# Patient Record
Sex: Female | Born: 1970
Health system: Southern US, Community
[De-identification: ages and names within clinical notes are randomized; demographics above are authoritative.]

## PROBLEM LIST (undated history)

## (undated) DIAGNOSIS — D649 Anemia, unspecified: Secondary | ICD-10-CM

## (undated) DIAGNOSIS — F329 Major depressive disorder, single episode, unspecified: Secondary | ICD-10-CM

## (undated) DIAGNOSIS — F32A Depression, unspecified: Secondary | ICD-10-CM

## (undated) HISTORY — DX: Anemia, unspecified: D64.9

---

## 2006-07-25 ENCOUNTER — Other Ambulatory Visit: Admission: RE | Admit: 2006-07-25 | Discharge: 2006-07-25 | Payer: Self-pay | Admitting: Obstetrics and Gynecology

## 2006-09-06 ENCOUNTER — Inpatient Hospital Stay (HOSPITAL_COMMUNITY): Admission: AD | Admit: 2006-09-06 | Discharge: 2006-09-06 | Payer: Self-pay | Admitting: Obstetrics and Gynecology

## 2006-10-08 ENCOUNTER — Emergency Department (HOSPITAL_COMMUNITY): Admission: EM | Admit: 2006-10-08 | Discharge: 2006-10-08 | Payer: Self-pay | Admitting: Family Medicine

## 2007-06-19 ENCOUNTER — Emergency Department (HOSPITAL_COMMUNITY): Admission: EM | Admit: 2007-06-19 | Discharge: 2007-06-20 | Payer: Self-pay | Admitting: Emergency Medicine

## 2007-11-17 ENCOUNTER — Inpatient Hospital Stay (HOSPITAL_COMMUNITY): Admission: EM | Admit: 2007-11-17 | Discharge: 2007-11-17 | Payer: Self-pay | Admitting: Emergency Medicine

## 2009-04-01 IMAGING — CR DG CHEST 2V
2 series · 2 of 2 positions shown · non-contrast
Comparison: None.

CLINICAL DATA: Left chest pain and dyspnea. 
 CHEST ? 2 VIEW:

[w chest pa]
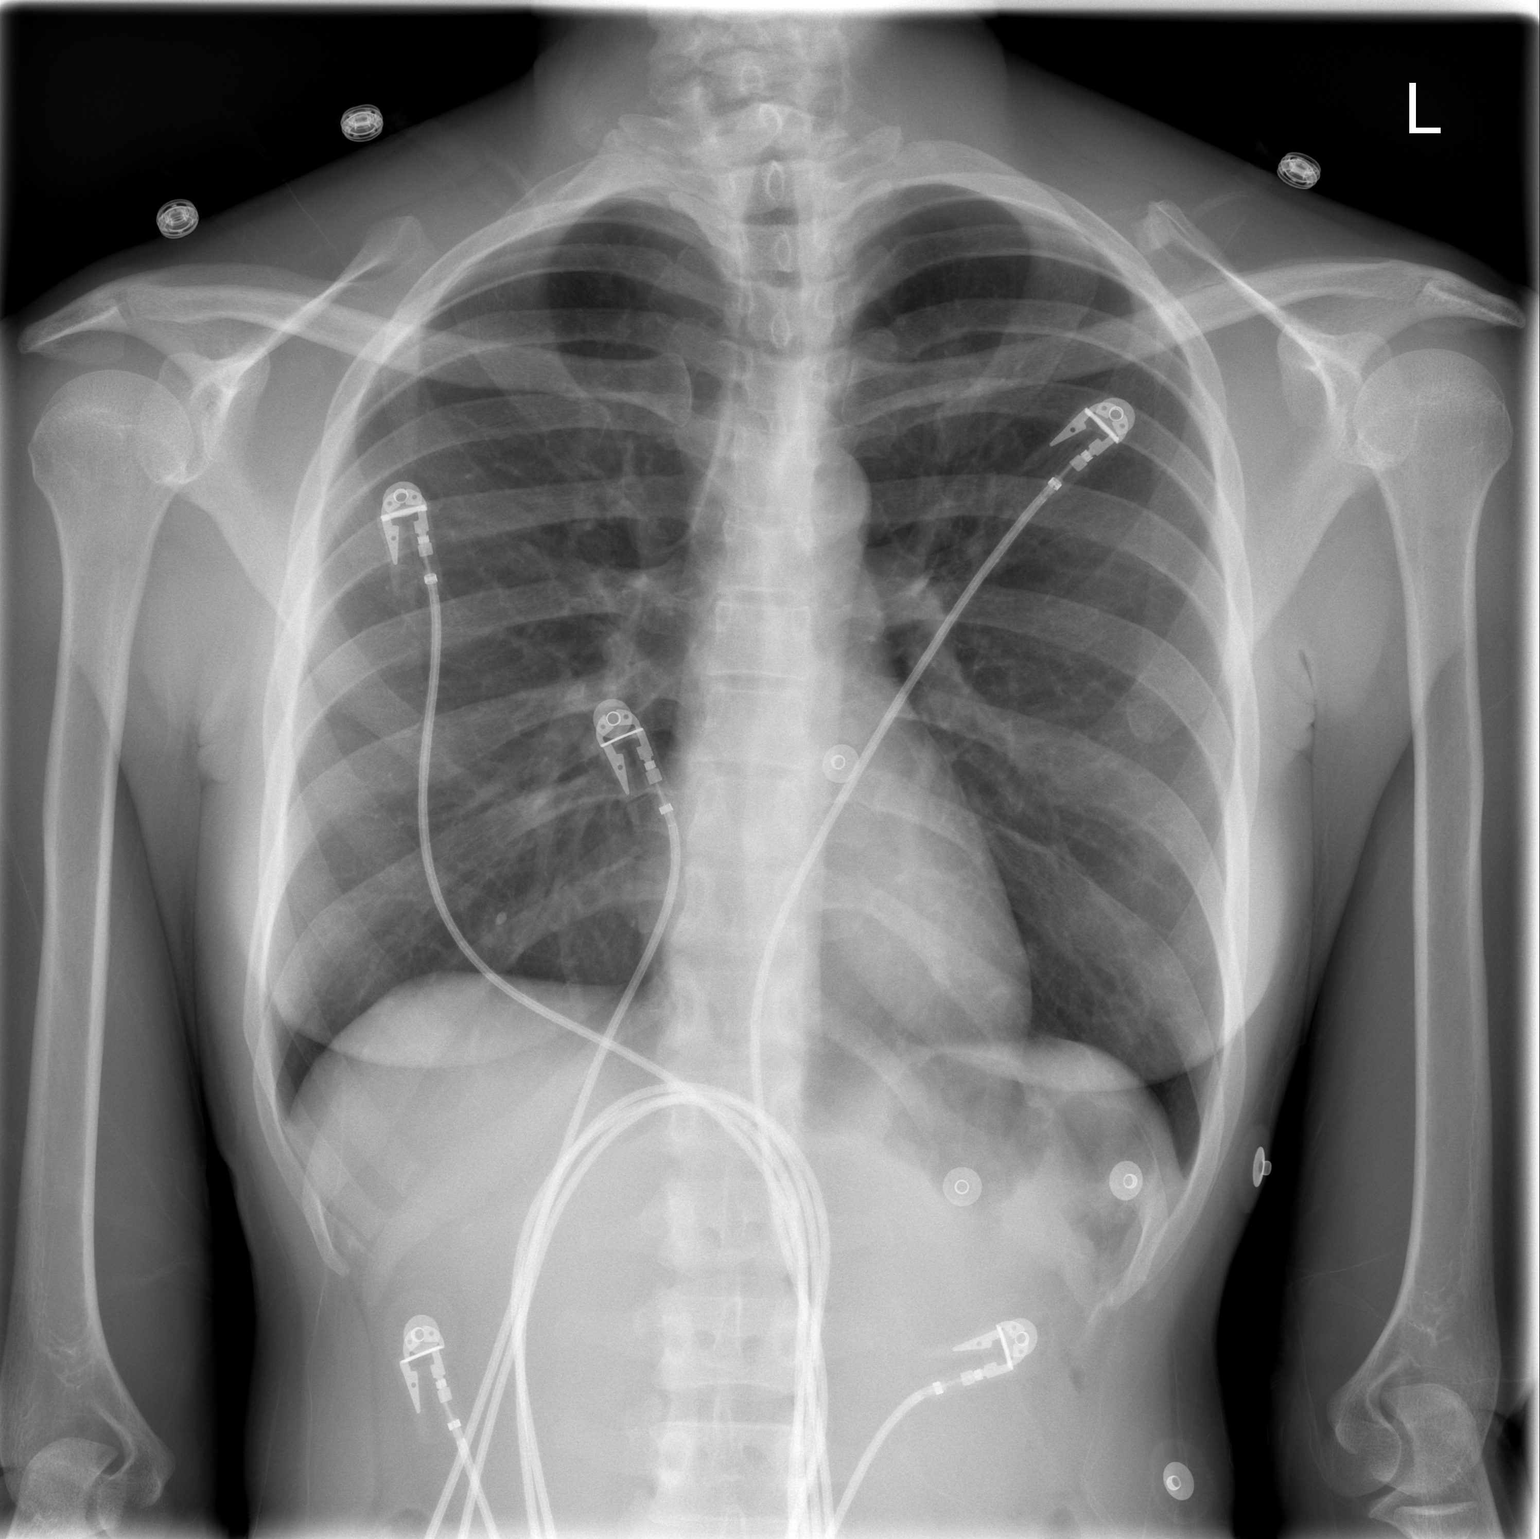

[w chest lat]
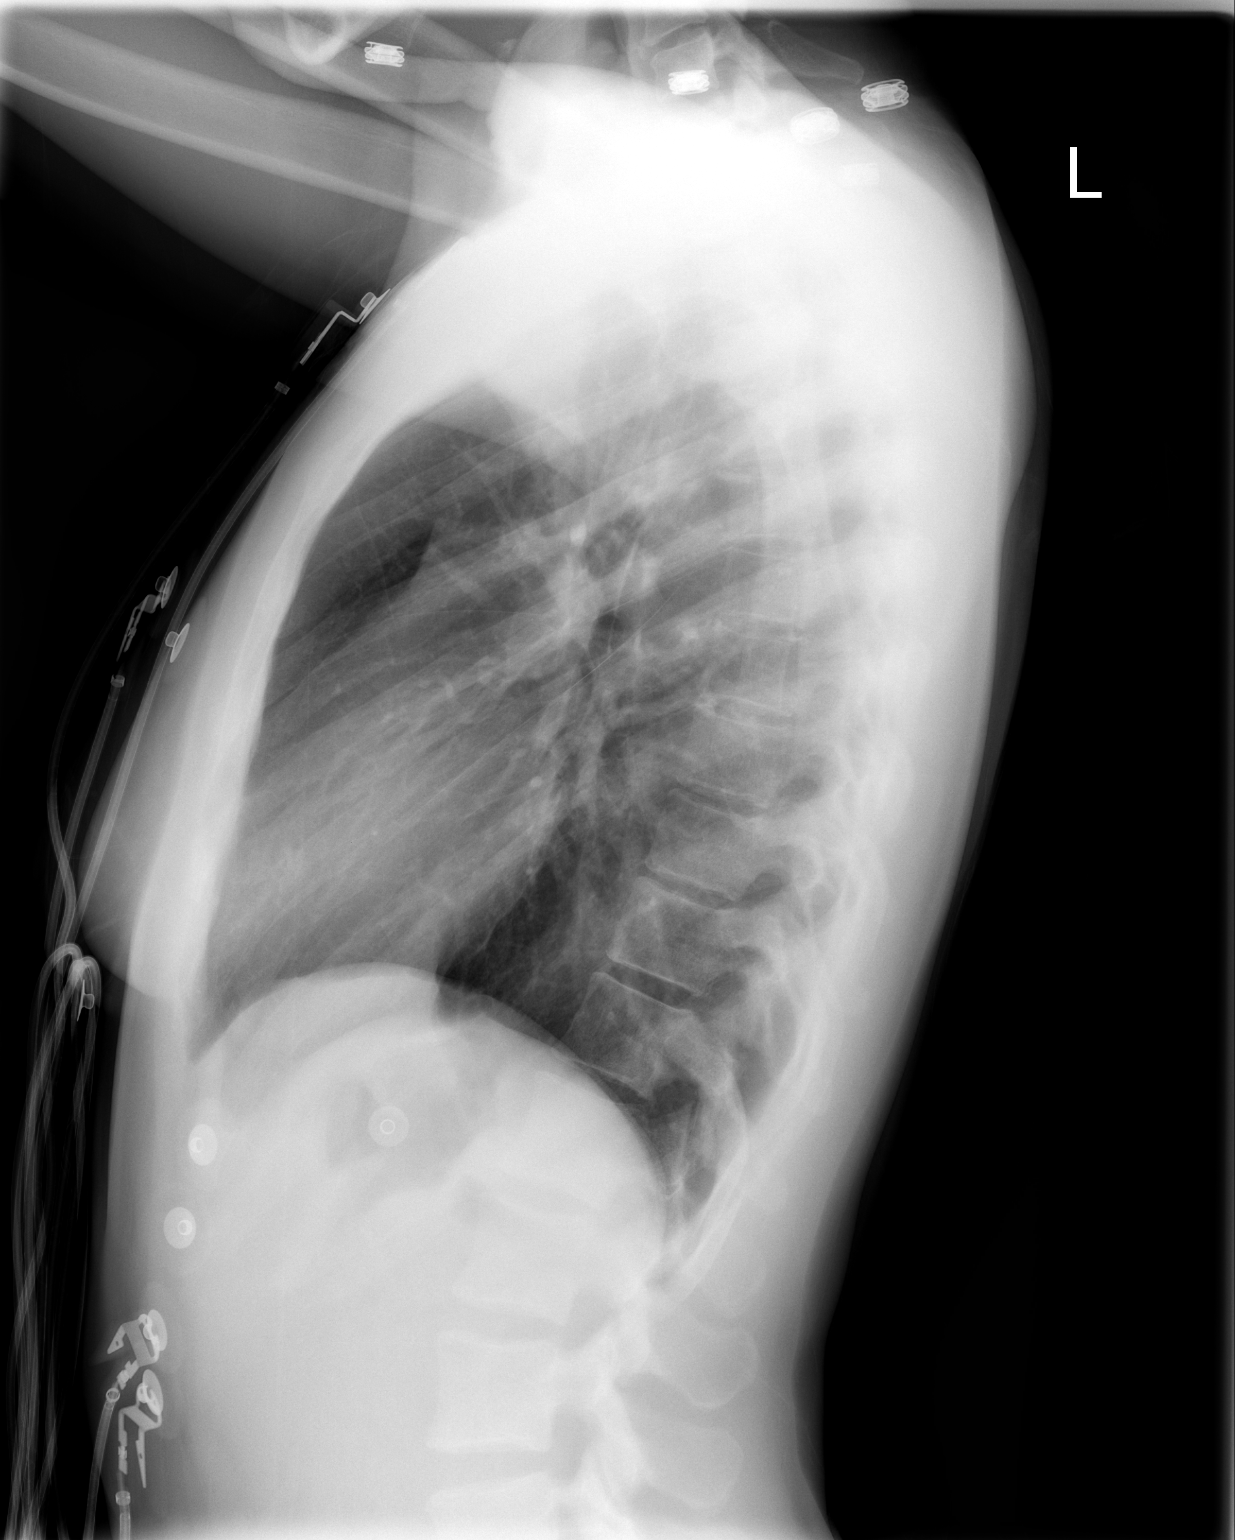

[2 of 2 positions shown; findings below may reference images not displayed]

FINDINGS: The heart size and mediastinal contours are within normal limits.  Both lungs are clear.  The visualized skeletal structures are unremarkable.
IMPRESSION: No active cardiopulmonary disease.
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
## 2010-12-25 NOTE — Discharge Summary (Signed)
NAME:  Patricia [name], Patricia [name] NO.:  [id number]   MEDICAL RECORD NO.:  [id number]          PATIENT TYPE:  INP   LOCATION:  1312                         FACILITY:  [facility]   PHYSICIAN:  Ladell Pier, M.D.   DATE OF BIRTH:  1971/08/12   DATE OF ADMISSION:  11/17/2007  DATE OF DISCHARGE:  11/17/2007                               DISCHARGE SUMMARY   HISTORY:  I reviewed the patient's record today, the 7th, at 9:41 a.m.  On reviewing the patient's records, she was found wandering on the  street.  She was brought in secondary to amnesia.   HOSPITAL COURSE:  She was brought to the floor and per nursing reports,  when they got in the room, the patient was gone, her bag was empty, her  IV was out.  [facility] Police Department was notified and will go to  the patient's home to see if she is there.  All her labs were normal.  I  did not see the patient prior to this happening.  The patient was  admitted early this morning by Dr. Darene Lamer.      Ladell Pier, M.D.  Electronically Signed     NJ/MEDQ  D:  11/17/2007  T:  11/17/2007  Job:  [id number]

## 2011-05-07 LAB — CBC
Hemoglobin: 12.5
RBC: 4.01
RDW: 12.8

## 2011-05-07 LAB — RAPID URINE DRUG SCREEN, HOSP PERFORMED
Amphetamines: NOT DETECTED
Cocaine: NOT DETECTED
Tetrahydrocannabinol: NOT DETECTED

## 2011-05-07 LAB — COMPREHENSIVE METABOLIC PANEL
Albumin: 3.8
Alkaline Phosphatase: 64
BUN: 7
Chloride: 103
GFR calc non Af Amer: >60
Potassium: 4
Sodium: 138

## 2011-05-07 LAB — PREGNANCY, URINE: Preg Test, Ur: NEGATIVE

## 2011-05-07 LAB — URINALYSIS, ROUTINE W REFLEX MICROSCOPIC
Ketones, ur: NEGATIVE
Nitrite: NEGATIVE
Protein, ur: NEGATIVE

## 2011-05-07 LAB — TSH: TSH: 0.981

## 2011-05-07 LAB — DIFFERENTIAL
Basophils Relative: 1
Eosinophils Absolute: 0.1
Eosinophils Relative: 1

## 2011-05-07 LAB — ETHANOL: Alcohol, Ethyl (B): <5

## 2011-05-21 LAB — CBC
HCT: 36.6
Hemoglobin: 12.4
MCHC: 33.9
MCV: 89.4
Platelets: 274
RBC: 4.09
RDW: 12
WBC: 3.4 — ABNORMAL LOW

## 2011-05-21 LAB — COMPREHENSIVE METABOLIC PANEL
ALT: 13
AST: 21
Albumin: 3.9
Alkaline Phosphatase: 56
BUN: 10
CO2: 30
Calcium: 9.4
Chloride: 103
Creatinine, Ser: 0.81
GFR calc Af Amer: >60
GFR calc non Af Amer: >60
Glucose, Bld: 76
Potassium: 3 — ABNORMAL LOW
Sodium: 140
Total Bilirubin: 0.7
Total Protein: 7.2

## 2011-05-21 LAB — DIFFERENTIAL
Basophils Absolute: 0
Basophils Relative: 1
Eosinophils Absolute: 0.1
Eosinophils Relative: 3
Lymphocytes Relative: 48 — ABNORMAL HIGH
Lymphs Abs: 1.6
Monocytes Absolute: 0.4
Monocytes Relative: 11
Neutro Abs: 1.3 — ABNORMAL LOW
Neutrophils Relative %: 37 — ABNORMAL LOW

## 2011-05-21 LAB — LIPASE, BLOOD: Lipase: 25

## 2015-09-12 ENCOUNTER — Emergency Department (HOSPITAL_COMMUNITY): Payer: Medicare Other

## 2015-09-12 ENCOUNTER — Encounter (HOSPITAL_COMMUNITY): Payer: Self-pay | Admitting: Emergency Medicine

## 2015-09-12 ENCOUNTER — Emergency Department (HOSPITAL_COMMUNITY)
Admission: EM | Admit: 2015-09-12 | Discharge: 2015-09-12 | Discharge status: Against Medical Advice | Payer: Medicare Other | Attending: Emergency Medicine | Admitting: Emergency Medicine

## 2015-09-12 DIAGNOSIS — M5489 Other dorsalgia: Secondary | ICD-10-CM

## 2015-09-12 DIAGNOSIS — R079 Chest pain, unspecified: Secondary | ICD-10-CM | POA: Diagnosis not present

## 2015-09-12 DIAGNOSIS — R0602 Shortness of breath: Secondary | ICD-10-CM | POA: Insufficient documentation

## 2015-09-12 DIAGNOSIS — R1010 Upper abdominal pain, unspecified: Secondary | ICD-10-CM | POA: Insufficient documentation

## 2015-09-12 DIAGNOSIS — M549 Dorsalgia, unspecified: Secondary | ICD-10-CM | POA: Diagnosis not present

## 2015-09-12 DIAGNOSIS — R070 Pain in throat: Secondary | ICD-10-CM | POA: Diagnosis not present

## 2015-09-12 LAB — CBC WITH DIFFERENTIAL/PLATELET
Basophils Absolute: 0 10*3/uL (ref 0.0–0.1)
Basophils Relative: 0 %
EOS ABS: 0.1 10*3/uL (ref 0.0–0.7)
EOS PCT: 2 %
HCT: 33.4 % — ABNORMAL LOW (ref 36.0–46.0)
Hemoglobin: 10.9 g/dL — ABNORMAL LOW (ref 12.0–15.0)
LYMPHS ABS: 1.7 10*3/uL (ref 0.7–4.0)
Lymphocytes Relative: 37 %
MCH: 29.7 pg (ref 26.0–34.0)
MCHC: 32.6 g/dL (ref 30.0–36.0)
MCV: 91 fL (ref 78.0–100.0)
MONOS PCT: 6 %
Monocytes Absolute: 0.3 10*3/uL (ref 0.1–1.0)
Neutro Abs: 2.6 10*3/uL (ref 1.7–7.7)
Neutrophils Relative %: 55 %
PLATELETS: 317 10*3/uL (ref 150–400)
RBC: 3.67 MIL/uL — AB (ref 3.87–5.11)
RDW: 12.6 % (ref 11.5–15.5)
WBC: 4.6 10*3/uL (ref 4.0–10.5)

## 2015-09-12 LAB — COMPREHENSIVE METABOLIC PANEL
ALT: 16 U/L (ref 14–54)
ANION GAP: 11 (ref 5–15)
AST: 18 U/L (ref 15–41)
Albumin: 3.6 g/dL (ref 3.5–5.0)
Alkaline Phosphatase: 85 U/L (ref 38–126)
CHLORIDE: 103 mmol/L (ref 101–111)
CO2: 25 mmol/L (ref 22–32)
Calcium: 9.3 mg/dL (ref 8.9–10.3)
Creatinine, Ser: 0.72 mg/dL (ref 0.44–1.00)
GFR calc non Af Amer: >60 mL/min (ref >60)
Glucose, Bld: 113 mg/dL — ABNORMAL HIGH (ref 65–99)
Potassium: 3.5 mmol/L (ref 3.5–5.1)
SODIUM: 139 mmol/L (ref 135–145)
Total Bilirubin: 0.3 mg/dL (ref 0.3–1.2)
Total Protein: 7.2 g/dL (ref 6.5–8.1)

## 2015-09-12 LAB — I-STAT TROPONIN, ED: Troponin i, poc: 0 ng/mL (ref 0.00–0.08)

## 2015-09-12 LAB — ETHANOL

## 2015-09-12 MED ORDER — SODIUM CHLORIDE 0.9 % IV BOLUS (SEPSIS): 1000.0000 mL | Freq: Once | INTRAVENOUS | Status: DC

## 2015-09-12 MED ORDER — GI COCKTAIL ~~LOC~~: 30.0000 mL | Freq: Once | ORAL | Status: DC

## 2015-09-12 MED ORDER — PANTOPRAZOLE SODIUM 40 MG PO TBEC
40.0000 mg | DELAYED_RELEASE_TABLET | Freq: Every day | ORAL | Status: DC
Start: 2015-09-12 — End: 2015-09-12

## 2015-09-12 NOTE — ED Notes (Signed)
Pt upset regarding iv start, pt stated she is ok and not getting a needle and is leaving. i attempted to convince pt to stay until I was able to get MD. Pt refused to stay and left. Msd aware.

## 2015-09-12 NOTE — ED Provider Notes (Signed)
By signing my name below, I, Bethel Born, attest that this documentation has been prepared under the direction and in the presence of Tien Aispuro N Jiovanna Frei, DO. Electronically Signed: Bethel Born, ED Scribe. 09/12/2015. 1:20 AM.  TIME SEEN: 1:20 AM  CHIEF COMPLAINT: throat, chest, and upper abdominal pain; back pain  HPI: Patricia [name] is a 45 y.o. female who presents to the Emergency Department complaining of new burning in her throat, chest, and upper abdominal pain with onset near noon yesterday. She took nothing for the pain at home. She was able to eat a [employer] sandwich without increased pain. Associated symptoms include a bitter taste in the mouth, SOB.  Denies nausea, vomiting, diarrhea, dysuria, hematuria, and hematochezia. Her stools have been black but she has been on an iron supplement for 1 month. LNMP was 1 week ago.  Denies frequent NSAID and alcohol use. No history of endoscopy or colonoscopy. No chest pressure.  Pain has been constant.  Is not exertional.  No history of DM, HTN, HLD, tobacco use.  No history of PE, DVT, exogenous estrogen use, fracture, surgery, trauma, hospitalization, prolonged travel. No lower extremity swelling or pain. No calf tenderness.    Also complains of diffuse back pain.  She describes the pain as aching and notes that it started at the same time as the throat pain. Pt denies any recent trauma or heavy lifting. Denies numbness, tingling, weakness, incontinence of bowel or bladder, difficulty urinating or emptying her bladder.  No history of spinal surgery or injections, cancer, or IV drug use. No fever or night sweats.    She has no PCP. Pt lives in [place] but drove to [place] because she likes this hospital.   ROS: See HPI Constitutional: no fever  Eyes: no drainage  ENT: no runny nose   Cardiovascular:   chest pain  Resp: SOB  GI: no vomiting GU: no dysuria Integumentary: no rash  Allergy: no hives   Musculoskeletal: no leg swelling  Neurological: no slurred speech ROS otherwise negative  PAST MEDICAL HISTORY/PAST SURGICAL HISTORY:  History reviewed. No pertinent past medical history.  MEDICATIONS:  Prior to Admission medications   Not on File    ALLERGIES:  No Known Allergies  SOCIAL HISTORY:  Social History  Substance Use Topics  . Smoking status: Never Smoker   . Smokeless tobacco: Not on file  . Alcohol Use: No    FAMILY HISTORY: No family history on file.  EXAM: BP 123/76 mmHg  Pulse 98  Temp(Src) 98 F (36.7 C) (Oral)  Resp 18  Ht  (1.575 m)  Wt 174 lb (78.926 kg)  BMI 31.82 kg/m2  SpO2 96%  LMP 09/02/2015 (Approximate) CONSTITUTIONAL: Alert and oriented and responds appropriately to questions. Well-appearing; well-nourished; does not appear acutely intoxicated. Pleasant and in no distress. Afebrile. HEAD: Normocephalic EYES: Conjunctivae clear, PERRL ENT: normal nose; no rhinorrhea; moist mucous membranes; pharynx without lesions noted NECK: Supple, no meningismus, no LAD  CARD: RRR; S1 and S2 appreciated; no murmurs, no clicks, no rubs, no gallops RESP: Normal chest excursion without splinting or tachypnea; breath sounds clear and equal bilaterally; no wheezes, no rhonchi, no rales, no hypoxia or respiratory distress, speaking full sentences ABD/GI: Normal bowel sounds; non-distended; soft, non-tender, no rebound, no guarding, no peritoneal signs RECTAL:  Patient refused BACK:  The back appears normal and is non-tender to palpation, there is no CVA tenderness, no midline spinal tenderness or step-off or deformity EXT: Normal ROM in all joints;  non-tender to palpation; no edema; normal capillary refill; no cyanosis, no calf tenderness or swelling    SKIN: Normal color for age and race; warm NEURO: Moves all extremities equally, sensation to light touch intact diffusely, cranial nerves II through XII intact, strength 5/5 in all 4 extremities , normal  gait PSYCH: The patient's mood and manner are appropriate. Grooming and personal hygiene are appropriate. Slightly bizarre affect but no current psychiatric safety concern.  MEDICAL DECISION MAKING: Pt here with what appears to be 2 separate complaints. She is complaining of burning esophageal, chest and upper abdominal pain. Abdominal exam is benign. She does not have risk factors for ACS or pulmonary embolus other than age.  Doubt cholecystitis, cholelithiasis, pancreatitis, colitis, bowel obstruction. Have low suspicion for ACS or PE. We'll obtain EKG, chest x-ray, cardiac and abdominal labs. Will give IV fluids, Protonix, GI cocktail and reassess. She reports she is having black stools and thinks is from her iron tablets. Have recommended a rectal exam which she refuses.  As for patient's back pain she describes it as an aching, worse with movement. No history of injury. No neurologic deficits. No red flag symptoms. Doubt cauda equina, spinal stenosis, epidural abscess or hematoma, transverse myelitis, discitis. At this time I do not feel she needs imaging of her back.  ED PROGRESS: Patient refused IV fluids. She told nursing staff that she wanted to go home. She had not received any medication for treatment. Nursing staff came to get me so that I could discuss plan of care with patient that by the time I got back to the room patient had already walked out. I was unable to talk to her about leaving AGAINST MEDICAL ADVICE.  Nursing staff states that she and related out with a quick and steady gait with no complaints. We were unable to obtain and EKG prior to her leaving. She did not receive any discharge paperwork her prescriptions. Her labs are pending.    3:30 AM  I have reviewed patient's labs after she has left AMA and they appear unremarkable other than a hemoglobin of 10.9. Troponin is negative. Again we are unable to obtain a urine, EKG, chest x-ray.     I personally performed the services  described in this documentation, which was scribed in my presence. The recorded information has been reviewed and is accurate.      Layla Maw Vonda Harth, DO 09/12/15 [phone]

## 2015-09-12 NOTE — ED Notes (Signed)
Pt reports esophagus pain, back pain and stomach pain, onset today, denies any heavy lifitng or changes in diet

## 2015-09-12 NOTE — ED Notes (Signed)
Pt. reports upper and lower back pain onset today , denies injury , no urinary discomfort or hematuria , pt. added pain at upper esophagus/sore throat this evening .

## 2015-09-26 ENCOUNTER — Emergency Department
Admission: EM | Admit: 2015-09-26 | Discharge: 2015-09-27 | Disposition: A | Discharge status: Other Acute Inpatient Hospital | Payer: Medicare Other | Attending: Emergency Medicine | Admitting: Emergency Medicine

## 2015-09-26 ENCOUNTER — Emergency Department: Payer: Self-pay

## 2015-09-26 DIAGNOSIS — F200 Paranoid schizophrenia: Secondary | ICD-10-CM | POA: Insufficient documentation

## 2015-09-26 LAB — VH URINE DRUG SCREEN
Amphetamine: NEGATIVE
Barbiturates: NEGATIVE
Buprenorphine, Urine: NEGATIVE
Cannabinoids: NEGATIVE
Cocaine: NEGATIVE
Methamphetamine: NEGATIVE
Opiates: NEGATIVE
Phencyclidine: NEGATIVE
Propoxyphene: NEGATIVE
Urine Benzodiazepines: NEGATIVE
Urine Methadone Screen: NEGATIVE
Urine Oxycodone: NEGATIVE
Urine Tricyclics: NEGATIVE

## 2015-09-26 LAB — VH URINALYSIS WITH MICROSCOPIC IF INDICATED
Bilirubin, UA: NEGATIVE mg/dL
Blood, UA: NEGATIVE mg/dL
Glucose, UA: NEGATIVE mg/dL
Ketones UA: NEGATIVE
Leukocyte Esterase, UA: NEGATIVE Leu/uL
Nitrite, UA: NEGATIVE
Protein, UR: NEGATIVE mg/dL
Urine Specific Gravity: 1.023 (ref 1.001–1.040)
Urobilinogen, UA: 1 mg/dL — AB
pH, Urine: 5.5 pH (ref 5.0–8.0)

## 2015-09-26 NOTE — ED Notes (Addendum)
Pt brought to ED by police stating pt with altered mental status. Personal belongings in police custody. Pt refuses to change to paper scrubs. Pt states she will give a urine specimen but refuses blood draw at this time.
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
## 2015-09-27 ENCOUNTER — Inpatient Hospital Stay: Payer: Medicare Other | Admitting: Psychiatry

## 2015-09-27 ENCOUNTER — Inpatient Hospital Stay
Admission: AD | Admit: 2015-09-27 | Discharge: 2015-10-11 | DRG: 885 | Disposition: A | Discharge status: Home / Self Care | Payer: Medicare Other | Source: Other Acute Inpatient Hospital | Attending: Psychiatry | Admitting: Psychiatry

## 2015-09-27 DIAGNOSIS — F200 Paranoid schizophrenia: Principal | ICD-10-CM | POA: Diagnosis present

## 2015-09-27 DIAGNOSIS — Z9119 Patient's noncompliance with other medical treatment and regimen: Secondary | ICD-10-CM

## 2015-09-27 DIAGNOSIS — Z818 Family history of other mental and behavioral disorders: Secondary | ICD-10-CM

## 2015-09-27 DIAGNOSIS — Z8659 Personal history of other mental and behavioral disorders: Secondary | ICD-10-CM

## 2015-09-27 DIAGNOSIS — Z885 Allergy status to narcotic agent status: Secondary | ICD-10-CM

## 2015-09-27 DIAGNOSIS — F1010 Alcohol abuse, uncomplicated: Secondary | ICD-10-CM | POA: Diagnosis present

## 2015-09-27 DIAGNOSIS — F329 Major depressive disorder, single episode, unspecified: Secondary | ICD-10-CM | POA: Diagnosis present

## 2015-09-27 DIAGNOSIS — D649 Anemia, unspecified: Secondary | ICD-10-CM | POA: Diagnosis present

## 2015-09-27 DIAGNOSIS — Z2821 Immunization not carried out because of patient refusal: Secondary | ICD-10-CM

## 2015-09-27 DIAGNOSIS — R45851 Suicidal ideations: Secondary | ICD-10-CM | POA: Diagnosis present

## 2015-09-27 LAB — TSH: TSH: 0.89 u[IU]/mL (ref 0.40–4.20)

## 2015-09-27 LAB — COMPREHENSIVE METABOLIC PANEL
ALT: 17 U/L (ref 0–55)
AST (SGOT): 16 U/L (ref 10–42)
Albumin/Globulin Ratio: 0.89 Ratio (ref 0.70–1.50)
Albumin: 3.7 gm/dL (ref 3.5–5.0)
Alkaline Phosphatase: 93 U/L (ref 40–145)
Anion Gap: 14 mMol/L (ref 7.0–18.0)
BUN / Creatinine Ratio: 6.3 Ratio — ABNORMAL LOW (ref 10.0–30.0)
BUN: 5 mg/dL — ABNORMAL LOW (ref 7–22)
Bilirubin, Total: 0.3 mg/dL (ref 0.1–1.2)
CO2: 22 mMol/L (ref 20.0–30.0)
Calcium: 9.5 mg/dL (ref 8.5–10.5)
Chloride: 107 mMol/L (ref 98–110)
Creatinine: 0.8 mg/dL (ref 0.60–1.20)
EGFR: >60 mL/min/{1.73_m2}
Globulin: 4.1 gm/dL — ABNORMAL HIGH (ref 2.0–4.0)
Glucose: 113 mg/dL — ABNORMAL HIGH (ref 70–99)
Osmolality Calc: 276 mOsm/kg (ref 275–300)
Potassium: 4 mMol/L (ref 3.5–5.3)
Protein, Total: 7.8 gm/dL (ref 6.0–8.3)
Sodium: 139 mMol/L (ref 136–147)

## 2015-09-27 LAB — ACETAMINOPHEN LEVEL: Acetaminophen Level: <0.6 ug/mL — ABNORMAL LOW (ref 10.0–30.0)

## 2015-09-27 LAB — CBC AND DIFFERENTIAL
Basophils %: 1 % (ref 0.0–3.0)
Basophils Absolute: 0.1 10*3/uL (ref 0.0–0.3)
Eosinophils Absolute: 0 10*3/uL (ref 0.0–0.8)
Hematocrit: 36.8 % (ref 36.0–48.0)
Hemoglobin: 12.1 gm/dL (ref 12.0–16.0)
Lymphocytes Absolute: 2.1 10*3/uL (ref 0.6–5.1)
Lymphocytes: 39 % (ref 15.0–46.0)
MCH: 30 pg (ref 28–35)
MCHC: 33 gm/dL (ref 33–37)
MCV: 92 fL (ref 80–100)
MPV: 9 fL (ref 8.0–12.0)
Monocytes Absolute: 0.3 10*3/uL (ref 0.1–1.7)
Monocytes: 6 % (ref 3.0–15.0)
Neutrophils %: 54 % (ref 42.0–78.0)
Neutrophils Absolute: 2.9 10*3/uL (ref 1.7–8.6)
PLT CT: 280 10*3/uL (ref 130–440)
RBC: 4.01 10*6/uL (ref 3.80–5.00)
RDW: 13.7 % (ref 12.0–15.0)
WBC: 5.3 10*3/uL (ref 4.0–11.0)

## 2015-09-27 LAB — SALICYLATE LEVEL: Salicylate Level: <5 mg/dL — ABNORMAL LOW (ref 5–30)

## 2015-09-27 LAB — ETHANOL: Alcohol: <10 mg/dL (ref 0–9)

## 2015-09-27 MED ORDER — HYDROXYZINE PAMOATE 50 MG PO CAPS
50.0000 mg | ORAL_CAPSULE | Freq: Four times a day (QID) | ORAL | Status: DC | PRN
Start: 2015-09-27 — End: 2015-10-11

## 2015-09-27 MED ORDER — RISPERIDONE 1 MG PO TABS
1.0000 mg | ORAL_TABLET | Freq: Every evening | ORAL | Status: DC
Start: 2015-09-27 — End: 2015-09-27

## 2015-09-27 MED ORDER — HALOPERIDOL LACTATE 5 MG/ML IJ SOLN
5.0000 mg | Freq: Four times a day (QID) | INTRAMUSCULAR | Status: DC | PRN
Start: 2015-09-27 — End: 2015-10-11

## 2015-09-27 MED ORDER — CEROVITE ADVANCED FORMULA PO TABS
1.0000 | ORAL_TABLET | Freq: Every day | ORAL | Status: DC
Start: 2015-09-27 — End: 2015-10-11
  Administered 2015-09-27 – 2015-10-11 (×12): 1 via ORAL
  Filled 2015-09-27 (×15): qty 1

## 2015-09-27 MED ORDER — HALOPERIDOL 5 MG PO TABS
5.0000 mg | ORAL_TABLET | Freq: Four times a day (QID) | ORAL | Status: DC | PRN
Start: 2015-09-27 — End: 2015-10-11

## 2015-09-27 MED ORDER — MAGNESIUM HYDROXIDE 400 MG/5ML PO SUSP
30.0000 mL | Freq: Every day | ORAL | Status: DC | PRN
Start: 2015-09-27 — End: 2015-10-11

## 2015-09-27 MED ORDER — RISPERIDONE 2 MG PO TABS
2.0000 mg | ORAL_TABLET | Freq: Every evening | ORAL | Status: DC
Start: 2015-09-27 — End: 2015-09-29
  Filled 2015-09-27 (×2): qty 1

## 2015-09-27 MED ORDER — ALUM & MAG HYDROXIDE-SIMETH 200-200-20 MG/5ML PO SUSP
30.0000 mL | Freq: Four times a day (QID) | ORAL | Status: DC | PRN
Start: 2015-09-27 — End: 2015-10-11

## 2015-09-27 MED ORDER — HALOPERIDOL 5 MG PO TABS
5.0000 mg | ORAL_TABLET | Freq: Every evening | ORAL | Status: DC
Start: 2015-09-27 — End: 2015-09-27

## 2015-09-27 MED ORDER — DIPHENHYDRAMINE HCL 50 MG PO CAPS
50.0000 mg | ORAL_CAPSULE | Freq: Four times a day (QID) | ORAL | Status: DC | PRN
Start: 2015-09-27 — End: 2015-10-11

## 2015-09-27 MED ORDER — ACETAMINOPHEN 325 MG PO TABS
650.0000 mg | ORAL_TABLET | Freq: Four times a day (QID) | ORAL | Status: DC | PRN
Start: 2015-09-27 — End: 2015-10-11
  Administered 2015-09-30 (×2): 650 mg via ORAL
  Filled 2015-09-27 (×2): qty 2

## 2015-09-27 MED ORDER — IBUPROFEN 600 MG PO TABS
600.0000 mg | ORAL_TABLET | Freq: Four times a day (QID) | ORAL | Status: DC | PRN
Start: 2015-09-27 — End: 2015-10-11
  Administered 2015-10-01: 600 mg via ORAL
  Filled 2015-09-27: qty 1

## 2015-09-27 MED ORDER — TRAZODONE HCL 50 MG PO TABS
50.0000 mg | ORAL_TABLET | Freq: Every evening | ORAL | Status: DC | PRN
Start: 2015-09-27 — End: 2015-10-11

## 2015-09-27 MED ORDER — DIPHENHYDRAMINE HCL 50 MG/ML IJ SOLN
50.0000 mg | Freq: Four times a day (QID) | INTRAMUSCULAR | Status: DC | PRN
Start: 2015-09-27 — End: 2015-10-11

## 2015-09-27 NOTE — ED Notes (Signed)
Pt refusing to change to paper scrubs.

## 2015-09-27 NOTE — Progress Note - Problem Oriented Charting Notewrit (Signed)
Patient in room most of this day sleeping. Out to dining room for lunch , ate only a few bites. Refused dinner and breakfast.  Attempts made by this nurse to talk more in depth. Patient states, "I just got lost". No other needs voiced this day.

## 2015-09-27 NOTE — Psych Admission Note (Signed)
Pt arrived on unit at 0345, under a Temporary Detention Order.  Pt is alert, oriented to person, time, with some confusion r/t place and denial r/t situation.  Pt reports that she was taken to hospital by police because she was lost.  Pt denies SI, HI, AVH.  Pt very evasive, irritable.  Pt denies any mental health history other than depression, 8 years ago, in which she took Wellbutrin.  Pt reports that she no longer takes that medication.  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  Pt advises she was staying in [place], had business in [place] today.  Further advised that came back to [place] and got lost while looking for [employer].    All paperwork reviewed with pt.  Medication consent signed.  Oriented to unit.  Skin check completed.  Pt went into room, was seen looking out the window and talking.  Pt then shut the door.  Will continue to monitor.

## 2015-09-27 NOTE — Progress Notes (Signed)
Denies SI/HI or AVH.

## 2015-09-27 NOTE — ED Notes (Signed)
TDO obtained by [place] staff member.

## 2015-09-27 NOTE — Progress Notes (Signed)
Patient given clothing this morning per MD order. Pleasant , cooperative . Denies SI/HI or AVH.

## 2015-09-27 NOTE — Progress Notes (Signed)
Pt went to sleep approx. 0430.  No issues.  No medications administered.

## 2015-09-27 NOTE — ED Notes (Signed)
PT will be accepted to adult BHU room 117 on TDO status by Dr. Anna Genre, nurse to nurse report can be called to [phone]

## 2015-09-27 NOTE — Progress Notes (Signed)
See pt belonging sheet;

## 2015-09-27 NOTE — Plan of Care (Signed)
Problem: Health Promotion  Goal: Vaccination Screening  All patients will be screened for current vaccination status on each admission.   Outcome: Completed Date Met:  09/27/15  Pt refuses flu vaccine  Goal: Risk control - tobacco abuse  Actions to eliminate or reduce tobacco use.   Outcome: Completed Date Met:  09/27/15  Pt is non smoker

## 2015-09-27 NOTE — ED Notes (Signed)
Pt standing in room. Refuses to sit down or lay on stretcher.  Continuous watch by police. Personal belongings remain with police.
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
## 2015-09-28 NOTE — Plan of Care (Addendum)
Pt in room, with door closed, most of shift.  Pt alert, oriented.  Pt presents suspicious.  Denies SI, HI, AVH but offers no further information.  Pt refused oral temperature as well as HS medication.  Pt out of room two times, walked unit and returned to room.  This RN questioned pt on poor PO intake today, pt stated that her stomach was upset.  Offered ginger ale and crackers, pt declined.  Will continue to monitor.

## 2015-09-28 NOTE — Progress Notes (Signed)
Patient presents as bright and pleasant.  Patient is awake but isolating to her room except for meals and to use the phone.  Patient is alert and oriented x 4.  Patient denies SI, HI and AVH.  Patient is not initiating conversations with peers.  Patient is polite, pleasant and cooperative with staff.  Patient did not allow staff to take her vital signs on two separate occasions during the shift stating she does not have blood pressure problems and she doesn't have a fever so she doesn't need her temperature taken.  Patient declined only scheduled medication (multivitamin) this AM.  No management problem.

## 2015-09-28 NOTE — Progress Notes (Signed)
[name],Naevia, 45 y.o. female  Date of Birth:  1970/11/13    IP BH Progress Note  Subjective:   RN/Interval events:   Patient presents as bright and pleasant. Patient is awake but isolating to her room except for meals and to use the phone. Patient is alert and oriented x 4. Patient denies SI, HI and AVH. Patient is not initiating conversations with peers. Patient is polite, pleasant and cooperative with staff. Patient did not allow staff to take her vital signs on two separate occasions during the shift stating she does not have blood pressure problems and she doesn't have a fever so she doesn't need her temperature taken. Patient declined only scheduled medication (multivitamin) this AM. No management problem.                      Pt:  Easily irritates and sarcastic,guarded with info, Minimized her poor med compliance and nit favoring med tx.     Remains symptomatic, guared , not forthcoming, , evasive, irritable mood., Reports gradual improvement in presenting symptoms and Denies SI/HI/AVH  Concerns:  Intermittent group participation, social with peers  Legal Status: CMA   Objective:   Mental Status Exam:     Mood:  "I'm ok"  Affect:  Mood incongruent and Limited Range  Thought Process: Goal Directed, Appropriately answers questions but with brief responses/underinclusion and Difficult to follow thought  Thought Content:  No Suicidal thoughts, intent or plan, No Homicidal thoughts, intent or plan, Paranoid belief structure, Preoccupation noted and Cognitive distortions; non fixed, but prominent  Perceptual Disturbance:  No auditory, tacile, olfactory, visual hallucination  Insight:  Improving  Judgment: Improving   Cognition:   Alert and Oriented to time, place and person    Medications:     Current Facility-Administered Medications   Medication Dose Route Frequency   . multivitamin  1 tablet Oral Daily   . risperiDONE  2 mg Oral QHS       Assessment and Plan:   Dx:  History of paranoid  schizophrenia  History of alcohol abuse  ? Compliance    Overall: patient remains symptomatic requiring further treatment/stabilization , patient with intermittent suicidal thoughts and remains risk for harm to self if discharged today, patient requires further medication adjustments and monitoring for response/side effects, remains psychotic and remains manic/hypomanic. Continue to monitor for side effects. Encouraged full group attendance, maintain visibility on the unit. Patient was provided with supportive therapy with focus on psychopharmacology education and improving coping skills.     Medications/Adjustments/Indications: Medication changes indicated: On Risperdal , refusing. Wil monitor mood and behavior    Medical comorbidities/treatments: none    Barriers to discharge: Remains symptomatic requiring further treatment.  Requires further medication adjustments and monitoring for response and side effects.  Requirng further psychiatric stabilization.    Risks and benefits for recommended therapy was discussed. Patient verbally acknowledged understanding and agreed to the aforementioned treatment plan.     Walden Field, MD  09/28/2015 1:22 PM

## 2015-09-28 NOTE — Progress Note - Problem Oriented Charting Notewrit (Signed)
Spoke with the apartment for the residential program run by [facility]. [name] reports that they have been looking for the pt for 2 weeks & since the pt has been gone the pt's father died & the funeral was held & the pt does not know this information. Ms Jenelle Mages will have the [facility] fax [place] the pt's records with her diagnosis & the medications which the pt is prescribed. The pt has refused case management & the doctor at [facility] did not stop the medication.

## 2015-09-28 NOTE — Progress Notes (Signed)
Pt is isolative to her room, laying in bed. She startles easily. Pt refused VS, does not want to take any meds. She did not come to group or snack time.   Denies AVH, denies paranoia or fearfulness. Pt instructed to seek staff for any reason.
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
## 2015-09-28 NOTE — Plan of Care (Signed)
Problem: Safety  Goal: Patient will be free from injury during hospitalization  Outcome: Progressing  Patient remains free from injury  this hospitalization.  Fall risk assessment completed Q shift.      Problem: Loss of functioning (Thought Disorder, Mood Disturbance and/or Severe Anxiety) AS EVIDENCED BY...  Goal: Will remain safe during hospitalization  Outcome: Progressing  Pt has remained safe this hospitalization.    Goal: Demonstrates orientation to person place and time  Outcome: Progressing  Pt is alert, oriented.

## 2015-09-28 NOTE — Progress Notes (Signed)
Pt rested fairly well throughout the shift.  Remained in room with door closed, coming out twice for a short period of time.  No medications administered this shift due to refusal.

## 2015-09-28 NOTE — Progress Notes (Signed)
Psychosocial, trauma screening, and discharge plan were discussed and completed with patient 09/28/2015 at 0840. Pt is established with Dr Zonia Kief in [place], [place]. Pt states that Dr Zonia Kief told pt she seemed to be doing fine and said pt did not need to take medicine.

## 2015-09-28 NOTE — Plan of Care (Signed)
Problem: Health Promotion  Goal: Knowledge - disease process  Extent of understanding conveyed about a specific disease process.   Outcome: Progressing  Patient states she does not have any questions about her illness.    Problem: Safety  Goal: Patient will be free from injury during hospitalization  Pt will verbalize 3 benefits of complying with medications as prescribed by day 5.   Outcome: Progressing  Patient has not reported any injuries.    Problem: Pain  Goal: Patient's pain/discomfort is manageable  Outcome: Progressing  Patient is not reporting any pain.    Problem: Psychosocial and Spiritual Needs  Goal: Demonstrates ability to cope with hospitalization/illness  Pt will identify at least 3 coping strategies to be utilized in recovery plan by day 5. Pt will verbalize that anxiety provoking situations are manageable in three out of 4 episodes by day 5   Outcome: Progressing  Patient has not reported or displayed any problems coping with hospitalization.    Problem: Loss of functioning (Thought Disorder, Mood Disturbance and/or Severe Anxiety) AS EVIDENCED BY...  Goal: Patient's recovery goal in his/her own words:  Outcome: Completed Date Met:  09/28/15  Patient states her recovery goal is to "have a good plan for after I get out."  Goal: Will remain safe during hospitalization  Outcome: Progressing  Patient has remained safe during hospitalization.  Goal: Verbalizes understanding of medication, benefits, and side effects  Outcome: Progressing  Patient states she does not have any questions about her medications.  Goal: Demonstrates orientation to person place and time  Outcome: Completed Date Met:  09/28/15  Patient is alert and oriented x 4.  Goal: Conducts goal directed, coherent conversation  Outcome: Completed Date Met:  09/28/15  Patient consistently conducts goal directed, coherent conversations.  Goal: Verbalizes reduction in hallucinations/delusions  Outcome: Progressing  Patient denies SI, HI and  AVH.  Goal: Maintains adequate nutrition/hydration  Outcome: Progressing  Patient states she is getting enough to eat and drink.  Goal: Maintains adequate sleep/rest pattern  Outcome: Progressing  Patient states she feels "groggy" but is getting enough sleep and rest.  Goal: Performs ADL's Independently  Outcome: Completed Date Met:  09/28/15  Patient states she does not need any assistance performing her ADLs.  Goal: Attends a minimum number of therapies daily  Outcome: Not Progressing  Patient is not attending groups.  Goal: Demonstrates decreased episodes of intrusive behavior / boundary violations  Outcome: Progressing  Patient has not demonstrated any instances of intrusive behavior.  Goal: Reports improved mood  Outcome: Progressing  Patient states her mood is "fine."  Goal: Verbalizes reduced anxiety  Outcome: Progressing  Patient denies any anxiety.  Goal: Completes discharge safety and recovery plan  Outcome: Not Progressing  Patient has not started working on her [place] safety plan.

## 2015-09-29 MED ORDER — RISPERIDONE 0.5 MG PO TBDP
0.5000 mg | ORAL_TABLET | Freq: Once | ORAL | Status: AC
Start: 2015-09-29 — End: 2015-09-29
  Administered 2015-09-29: 0.5 mg via ORAL
  Filled 2015-09-29: qty 1

## 2015-09-29 MED ORDER — RISPERIDONE 0.5 MG PO TBDP
ORAL_TABLET | ORAL | Status: AC
Start: 2015-09-29 — End: 2015-09-30
  Filled 2015-09-29: qty 1

## 2015-09-29 MED ORDER — RISPERIDONE 0.5 MG PO TBDP
0.5000 mg | ORAL_TABLET | Freq: Two times a day (BID) | ORAL | Status: DC
Start: 2015-09-29 — End: 2015-09-30
  Administered 2015-09-29: 0.5 mg via ORAL
  Filled 2015-09-29 (×2): qty 1

## 2015-09-29 NOTE — Plan of Care (Signed)
Pt remains vague and guarded only answering questions minimally or not at all.  When asked why she was here pt stated "I was just lost and the police brought me here".   When pressed further pt did state "I have bipolar but I don't know why I am here".  When entering patients room pt startled easily and yelled out "you scared me".  Later when room checks were being done pt yelled at nurse stating she was trying to sleep.  Pt frequently up and down coming out into milieu looking around appearing almost paranoid at times then going back into her room.  Pt attempted several times to call her brother but the number she was dialing did not seem to be working.  When offered to help her make another call pt stated no and went back to her room.  Pt denies SI,HI,AVH.

## 2015-09-29 NOTE — Plan of Care (Signed)
Problem: Safety  Goal: Patient will be free from injury during hospitalization  Pt will verbalize 3 benefits of complying with medications as prescribed by day 5.   Outcome: Progressing    Problem: Psychosocial and Spiritual Needs  Goal: Demonstrates ability to cope with hospitalization/illness  Pt will identify at least 3 coping strategies to be utilized in recovery plan by day 5. Pt will verbalize that anxiety provoking situations are manageable in three out of 4 episodes by day 5   Outcome: Not Progressing  Pt not expressing feelings/concerns and remains vague with answers.  Pt not setting goals for herself and not using positive coping behaviors.      Problem: Loss of functioning (Thought Disorder, Mood Disturbance and/or Severe Anxiety) AS EVIDENCED BY...  Goal: Attends a minimum number of therapies daily  Outcome: Not Progressing  Pt refused to come to wrap up group and remained in her room  Goal: Verbalizes reduced anxiety  Outcome: Not Progressing  Pt remains anxious and observed pacing around at times or would yell out when entering her room.

## 2015-09-29 NOTE — Progress Notes (Signed)
Patient was isolative this morning to room , did come out for both am groups and meals. After seeing MD, pt.     Did agree to take risperdal medication twice this day.

## 2015-09-29 NOTE — Plan of Care (Addendum)
Problem: Safety  Goal: Patient will be free from injury during hospitalization  Pt will verbalize 3 benefits of complying with medications as prescribed by day 5.   Outcome: Progressing  Intervention: Assess for patient's risk for elopement and implement Elopement Risk plan per policy  Pt is an elopement risk, poor insight into reason for hospitalization       Problem: Pain  Goal: Patient's pain/discomfort is manageable  Outcome: Adequate for Discharge    Problem: Psychosocial and Spiritual Needs  Goal: Demonstrates ability to cope with hospitalization/illness  Pt will identify at least 3 coping strategies to be utilized in recovery plan by day 5. Pt will verbalize that anxiety provoking situations are manageable in three out of 4 episodes by day 5   Outcome: Not Progressing  Intervention: Encourage verbalization of feelings/concerns/expectations  Does not initiate interaction with staff, isolative  Intervention: Provide quiet environment  Retreats to rm  Intervention: Encourage participation in diversional activity  Not attending gruop      Problem: Loss of functioning (Thought Disorder, Mood Disturbance and/or Severe Anxiety) AS EVIDENCED BY...  Goal: Will remain safe during hospitalization  Outcome: Progressing  Goal: Verbalizes understanding of medication, benefits, and side effects  Outcome: Not Progressing  Goal: Verbalizes reduction in hallucinations/delusions  Outcome: Progressing  Denies presence of hallucinations, or feelings of paranoia  Goal: Maintains adequate sleep/rest pattern  Outcome: Progressing

## 2015-09-29 NOTE — Progress Notes (Signed)
Pt. Isolative to room this day.  Refused am medication.  Denies SI/HI or AVH.

## 2015-09-29 NOTE — Progress Notes (Signed)
Patient refused AM vital signs.   

## 2015-09-29 NOTE — Progress Note - Problem Oriented Charting Notewrit (Signed)
Kimberly Bowman,Kimberly Bowman, 45 y.o. female  Date of Birth:  [birth year]    IP BH Progress Note  Subjective:   RN/Interval events:   Pt is isolative to her room, laying in bed. She startles easily. Pt refused VS, does not want to take any meds. She did not come to group or snack time.   Denies AVH, denies paranoia or fearfulness. Pt instructed to seek staff for any reason.                    Pt:  States that she was at [facility] in [place] , 4 years ago and was Dx with Bipolar disorder . Generally cooperates but evasive and guarded. Reports being hospitalized for  A month as per self report. Was prescribed Risperdal 1 mg po bid.     Remains symptomatic, with paranoid ideations, giggly , preoccupied with voices. Psychosis and mania.  Concerns:  Intermittent group participation, social with peers  Legal Status: INVOL  Objective:   Mental Status Exam:     Mood:  "I'm doing good" and Emotional/tearful/depressed  Affect:  Mood incongruent and Labile  Thought Process: Perseverating/Circumstantial  Thought Content: Paranoid belief structure, Preoccupation noted, Cognitive distortions; non fixed, but prominent and Unable to assess Suicidality/Homicidality  Perceptual Disturbance:  Appears to be responding to stimuli; at times self dialogue   Insight:  Poor  Judgment: Poor   Cognition:   Alert, Oriented to time, place and person and Attention Fair    Medications:     Current Facility-Administered Medications   Medication Dose Route Frequency   . multivitamin  1 tablet Oral Daily   . risperiDONE       . risperiDONE  0.5 mg Oral BID       Assessment and Plan:   Dx:  Dx:  History of paranoid schizophrenia  History of alcohol abuse  ? Compliance    Overall: patient remains symptomatic requiring further treatment/stabilization , patient requires further medication adjustments and monitoring for response/side effects, concerns remain about patients level of safety if discharged today, remains psychotic,  remains manic/hypomanic and no improvement from hospital presentation. Continue to monitor for side effects. Encouraged full group attendance, maintain visibility on the unit. Patient was provided with supportive therapy with focus on psychopharmacology education and improving coping skills.     Medications/Adjustments/Indications: Medication changes indicated: added Risperdal 0.5 mg po bid.     Medical comorbidities/treatments: None    Barriers to discharge: Requirng further psychiatric stabilization.  No safe discharge planning in place at this time.    Risks and benefits for recommended therapy was discussed. Patient verbally acknowledged understanding and agreed to the aforementioned treatment plan.     Walden Field, MD  09/29/2015 3:52 PM

## 2015-09-30 MED ORDER — ZIPRASIDONE HCL 40 MG PO CAPS
40.0000 mg | ORAL_CAPSULE | Freq: Two times a day (BID) | ORAL | Status: DC
Start: 2015-09-30 — End: 2015-10-11
  Administered 2015-09-30 – 2015-10-11 (×23): 40 mg via ORAL
  Filled 2015-09-30 (×23): qty 1

## 2015-09-30 NOTE — Progress Notes (Signed)
No medications given 7pm - 7am shift.

## 2015-09-30 NOTE — Progress Notes (Signed)
Pt yelling at tech as she was doing her q 15 minute checks stating she was reporting her.  RN went and spoke to pt regarding the necessity of performing the checks and pt stated she could see that she was the only one being checked on over and over.  RN educated pt that all the patients were being checked on equally and for the same amount of time.  Pt insistent that she was being checked on more than the others because she could see it.  RN reminded pt that she has been in her bedroom the entire time with the door closed and has not been out to witness staff performing the checks.  Pt argumentative stating she knows what is going on.  Pt agreeable to leave door open halfway so that the sound of the door opening would not wake her.

## 2015-09-30 NOTE — Plan of Care (Signed)
Pt remains isolative to her room and does not socialize with other patients. .  Continues to not seem to understand why she is here and states she is only here because the police brought her here because she was lost.   Pt initially refused for her vital signs to be taken then was agreeable.  Pulse ox placed on finger and blood pressure cuff started but as soon as blood pressure cuff started to pump up patient ripped the cuff off of her arm and stated "it hurts my arm" and refused for blood pressure to be obtained.  Pt also refused for temperature to be obtained as well.  Explained the importance of monitoring her vital signs but pt simply glared at nurse and did not respond.  Pt asked if she was able to get in touch with her family and pt stated "the numbers don't work.".  RN offered to help patient make a phone call to her family but patient refused.   Pt appears paranoid and guarded and remains vague with answers.  When entering patients room pt laying at the foot end of her bed staring at the wall.  Offered to turn off the light and pt refused stating she was fine.   Pt denies SI,HI,AVH.

## 2015-09-30 NOTE — Progress Notes (Signed)
Progress Note/SOAP Note    Subjective:   Any Comments/Concerns/Issues: "I am fine, Risperdal doesn't work for me, I used to take it with Wellbutrin, they don't work,  I don;t have any problems" 'I never heard voices, I am not paranoid"      Objective:   MSE:  General Appearance: Appears stated age, good eye contact, no psychomotor retardation, no psychomotor agitation, no Abnormal body movement noted and Appropriately Attired, some disorganization.   Speech:  Spontaneous, Clear, Coherent and Not pressured  Mood:  "I am fine"  Affect:  Mood Congruent and Normal Range  Thought Process:  Linear, Goal Directed and Over Inclusive  Thought Content:  Paranoid Ideation, Preoccupation with current predicament, No suicidal thought, intent, plan and No homicidal thought, intent, plan  Perceptual Disturbance:  No audio, tacile, olfactory, visual hallucination  Insight:  Improving  Judgment: Improving   Cognition:   Alert and Oriented to time, place and person    Medications:   Allergies:  Oxycodone    Current Facility-Administered Medications   Medication Dose Route Frequency   . multivitamin  1 tablet Oral Daily   . ziprasidone  40 mg Oral BID Meals       Assessment / Plan / Goals:    History of paranoid schizophrenia  History of alcohol abuse  ? Compliance    Disputes her diagnostic history, agrees to take Geodon 30 mg bid only.   Benefits and side effects of medications discussed.  Encouraged patient to remain adherent with all treatment modalities.  Psychoeducation and supportive Psychotherapy provided.  Continue current Psychotropic medications, milieu and [facility] programming for further psychiatric stabilization.        Barriers to Discharge: Requires further medication adjustments and monitoring for response and side effects.  Requirng further psychiatric stabilization.     Patient understood and agrees with plan.    Chapman Fitch, MD  09/30/2015 12:13 PM    Pager #:[phone]  Ext. # [phone]

## 2015-09-30 NOTE — Progress Note - Problem Oriented Charting Notewrit (Signed)
Spoke with the pt about the information I received that her father had died. She didn't know why the staff from [facility] would have this information. Attempted to find information of the internet & was unsuccessful. Encouraged the pt to contact her family to clarify this situation.

## 2015-09-30 NOTE — Plan of Care (Signed)
Problem: Safety  Goal: Patient will be free from injury during hospitalization  Pt will verbalize 3 benefits of complying with medications as prescribed by day 5.   Outcome: Progressing    Problem: Psychosocial and Spiritual Needs  Goal: Demonstrates ability to cope with hospitalization/illness  Pt will identify at least 3 coping strategies to be utilized in recovery plan by day 5. Pt will verbalize that anxiety provoking situations are manageable in three out of 4 episodes by day 5   Outcome: Not Progressing  Pt not expressing her feelings and concerns with staff and remains isolative appearing paranoid and guarded.  Pt not attending groups and not setting goals for herself.     Problem: Loss of functioning (Thought Disorder, Mood Disturbance and/or Severe Anxiety) AS EVIDENCED BY...  Goal: Reports improved mood  Outcome: Not Progressing  Pt appears anxious and is refusing vital signs stating she doesn't need them.  Continues to not seem to understand why she is here but is not willing to participate in the program.

## 2015-09-30 NOTE — Progress Notes (Signed)
Pt has been isolative to self and room. Pt laid in the bed mostly. Pt came out for meals and retreated to her room. Pt was medication compliant after speaking with the MD. Pt appears less watchful this afternoon. Pt was initially irritable this AM. Pt continues to decline VS stating "You only need them one time". Pt dramatic and flirtatious at times. Will continue to monitor pt.    ALL MEDS-INCLUDING FIRST DOSES-NO REPORTED OR OBSERVED SIDE EFFECTS OR ADVERSE REACTIONS [id number].

## 2015-09-30 NOTE — Plan of Care (Signed)
Problem: Loss of functioning (Thought Disorder, Mood Disturbance and/or Severe Anxiety) AS EVIDENCED BY...  Goal: Verbalizes reduction in hallucinations/delusions  Outcome: Progressing  Pt denies AVH. Pt presently does not appear to be responding to AVH.   Intervention: Monitor the presence of hallucinations/delusions q shift  Pt denies AVH.     Goal: Attends a minimum number of therapies daily  Outcome: Progressing  Pt encouraged to attend groups and unit activities. Pt appears to be disinterested.   Goal: Reports improved mood  Outcome: Progressing  Pt reports "I feel fine".

## 2015-09-30 NOTE — Progress Notes (Addendum)
Pt refused AM VS stating "they said if I got them done one time that I should be good." Pt encouraged to let staff get a set twice daily as it was important to monitor them. Pt replied, "No I think they are fine. They pretty much stay the same." Pt also refused AM medications stating "I would like to speak with the psychiatrist about me medications. Risperdal doesn't do anything for me anymore". Will continue to monitor pt.

## 2015-10-01 NOTE — Progress Notes (Signed)
Progress Note/SOAP Note    Subjective:   Any Comments/Concerns/Issues: "I have cramps in my stomach, I am alright otherwise, I am taking my new medications"    Objective:   MSE:  General Appearance: Appears stated age, good eye contact, no psychomotor retardation, no psychomotor agitation, no Abnormal body movement noted and Appropriately Attired\  Speech:  Spontaneous, Clear, Coherent and Not pressured  Mood:  "I am ok"  Affect:  Mood Congruent and Normal Range  Thought Process:  Linear and Goal Directed  Thought Content:  Paranoid Ideation, No Delusion, No suicidal thought, intent, plan and No homicidal thought, intent, plan  Perceptual Disturbance:  No audio, tacile, olfactory, visual hallucination  Insight:  Improving  Judgment: Improving   Cognition:   Alert and Oriented to time, place and person    Medications:   Allergies:  Oxycodone    Current Facility-Administered Medications   Medication Dose Route Frequency   . multivitamin  1 tablet Oral Daily   . ziprasidone  40 mg Oral BID Meals       Assessment / Plan / Goals:    History of paranoid schizophrenia  History of alcohol abuse  ? Compliance.    Mild improvement in symptoms and responding to current treatment modalities.  Encouraged patient to remain adherent with all treatment modalities.  Psychoeducation and supportive Psychotherapy provided.  Sleep Hygiene related psychoeducation provided.  Continue current Psychotropic medications, milieu and [facility] programming for further psychiatric stabilization.        Barriers to Discharge: Requires further medication adjustments and monitoring for response and side effects.  Temporary Detention Order (TDO)/Involuntary Admission/Court mandated admission.     Patient understood and agrees with plan.    Chapman Fitch, MD  10/01/2015 1:43 PM    Pager #:[phone]  Ext. # [phone]

## 2015-10-01 NOTE — Progress Notes (Signed)
Pt requesting medication for back pain.  Nurse explained to patient that vital signs would need to be assessed before administering medication and pt agreeable.  Vital signs stable and pt medicated with tylenol for back upper back pain.

## 2015-10-01 NOTE — Plan of Care (Signed)
Problem: Safety  Goal: Patient will be free from injury during hospitalization  Pt will verbalize 3 benefits of complying with medications as prescribed by day 5.   Outcome: Progressing    Problem: Psychosocial and Spiritual Needs  Goal: Demonstrates ability to cope with hospitalization/illness  Pt will identify at least 3 coping strategies to be utilized in recovery plan by day 5. Pt will verbalize that anxiety provoking situations are manageable in three out of 4 episodes by day 5   Outcome: Not Progressing  Pt not expressing feelings and concerns and not setting goals for herself.  Pt not participating in groups and not able to identify strengths.    Problem: Loss of functioning (Thought Disorder, Mood Disturbance and/or Severe Anxiety) AS EVIDENCED BY...  Goal: Attends a minimum number of therapies daily  Outcome: Not Progressing  Goal: Verbalizes reduced anxiety  Outcome: Not Progressing  Pt appears anxious though denies so.   Startles easily when entering the room.

## 2015-10-01 NOTE — Plan of Care (Signed)
Pt remained in room duration of shift.  Frequently entering room pt was observed laying on abdomen staring at wall.  Pt vague with answers and answers minimally.   Pt continues to state she is only here because she was lost and the police brought her here.   When questioned why she was on a psychiatric unit pt simply states she has bipolar and that's the only reason she can think of.  Still appears slightly paranoid.  Continues to refuse vital signs to be taken.  Denies SI,HI,AVH.

## 2015-10-01 NOTE — Progress Notes (Signed)
All 7pm - 7am medications given with no reported or observed side effects or adverse reactions including first doses given.

## 2015-10-01 NOTE — Plan of Care (Signed)
Problem: Loss of functioning (Thought Disorder, Mood Disturbance and/or Severe Anxiety) AS EVIDENCED BY...  Goal: Will remain safe during hospitalization  Outcome: Progressing  Contracts for safety      Goal: Verbalizes reduction in hallucinations/delusions  Outcome: Progressing  Denies hallucinations this am  Goal: Attends a minimum number of therapies daily  Outcome: Progressing  Has been encouraged to attend and participate in group

## 2015-10-01 NOTE — Progress Notes (Signed)
Patient has remained in room. When asked why she did not attend group or come out of room patient states "Its boring theres nothing to do". Patient denies SI/HI/AVH. Denies depression and anxiety. Patient more verbal today. Improvement noted since Friday.       All 7a-7p meds given including all first doses with no adverse reactions noted.

## 2015-10-02 NOTE — Plan of Care (Signed)
Problem: Safety  Goal: Patient will be free from injury during hospitalization  Pt will verbalize 3 benefits of complying with medications as prescribed by day 5.   Intervention: Assess patient's risk for falls and implement fall prevention plan of care per policy  No falls this shift.   Intervention: Assess for patient's risk for elopement and implement Elopement Risk plan per policy  Patient present on unit this day , socializing and cooperative with care.       Problem: Psychosocial and Spiritual Needs  Goal: Demonstrates ability to cope with hospitalization/illness  Pt will identify at least 3 coping strategies to be utilized in recovery plan by day 5. Pt will verbalize that anxiety provoking situations are manageable in three out of 4 episodes by day 5   Intervention: Provide quiet environment  Patient goes to room as needed for quiet time.       Problem: Loss of functioning (Thought Disorder, Mood Disturbance and/or Severe Anxiety) AS EVIDENCED BY...  Goal: Verbalizes reduction in hallucinations/delusions  Outcome: Progressing  Denies hallucinations   Goal: Reports improved mood  Outcome: Progressing  States feels better and appears better rested.

## 2015-10-02 NOTE — Progress Notes (Signed)
Patient more present this day on the unit.  Pt. Talking to fellow patients. Appears well rested. States , "I feel better on this medication , then I did the other , Risperdal".    Attempts by this nurse to find out where car is.  Car was sent back to rental agency that patient had gotten from in [place].   All 7a-7pm medications given this day without ill effect.

## 2015-10-02 NOTE — Plan of Care (Signed)
Pt visible on unit though tended to isolate to herself.    Pt was observed sitting at table with peers but did not socialize with them.  Pt appeared less paranoid and was more sociable with nurse though still remained vague and guarded.  In report it was told that pt was unable to get in touch with any of her family members due the phone numbers she was calling were all disconnected but pt stated something completely different stating "I spoke to a couple of my family today".  Pt denies SI,HI,AVH and stated she was ready to go home.

## 2015-10-02 NOTE — Progress Notes (Signed)
[name],Panhia, 45 y.o. female  Date of Birth:  28-Sep-1970    IP BH Progress Note  Subjective:   RN: Isolative to bedroom, little interaction with peers and Little motivation noted by staff/observation. No behavioral disturbances, no need for injectable medications or when necessary's for agitation, restlessness, psychosis.    S: "I'm fine, I just want to go soon". Patient mostly isolative, minimizing situation, superficial answers but although does not appear internally stimulated or overtly paranoid. No signs of mania, denies depression. No thoughts of self-harm or harm to others. Has an organized plan for discharge including encompassing cousin/biological parents upon discharge to [place]. Has been compliant with Geodon, cites no side effects. Denies any side effects from medication, Reports gradual improvement in presenting symptoms and Denies SI/HI/AVH    Legal Status: CMA  Objective:   MENTAL STATUS EXAM:   Mood:  "I'm fine"  Affect:  Mood Congruent and Limited/Restricted Range  Thought Process:  Appropriately answers questions but with brief responses/underinclusion  Thought Content:  No Suicidal thoughts, intent or plan, No Homicidal thoughts, intent or plan and No delusional thought content  Perceptual Disturbance: No auditory, tacile, olfactory, visual hallucination  Insight/Judgment: Improving and Limited at baseline  Cognition:   Alert and Oriented to time, place and person    Medications:     Current Facility-Administered Medications   Medication Dose Route Frequency   . multivitamin  1 tablet Oral Daily   . ziprasidone  40 mg Oral BID Meals       Assessment / Plan:   History of schizophrenia; paranoid versus undifferentiated  History of alcohol abuse  History of poor compliance Compliance.      Overall Patient seems to be slightly improving from baseline although still remains isolative and superficial with answers. No overt behaviors warranting further detention. Has been compliant with  medications, has a discharge safety plan.  Responding to current medication regimen and plan, continue current outlined plan and Encouarge group participation and unit programming..    Medical concerns:no acute concerns noted    Barriers to discharge: Start D/C Planning. Nursing staff to coordinate a phone conversation with cousin/biological parents for discharge tomorrow to [facility]     Monitoring closely for side effects. Risks and benefits for recommended therapy was discussed.     Patient verbally acknowledged understanding and agreed to the aforementioned treatment plan.     [name], DO  10/02/2015 12:33 PM

## 2015-10-02 NOTE — Plan of Care (Signed)
Problem: Safety  Goal: Patient will be free from injury during hospitalization  Pt will verbalize 3 benefits of complying with medications as prescribed by day 5.   Outcome: Progressing    Problem: Loss of functioning (Thought Disorder, Mood Disturbance and/or Severe Anxiety) AS EVIDENCED BY...  Goal: Attends a minimum number of therapies daily  Outcome: Progressing  Pt attended and participated in wrap up group  Goal: Reports improved mood  Outcome: Progressing  Pt stated she is feeling better and ready to go

## 2015-10-03 MED ORDER — ZIPRASIDONE HCL 40 MG PO CAPS
40.0000 mg | ORAL_CAPSULE | Freq: Two times a day (BID) | ORAL | Status: DC
Start: 2015-10-03 — End: 2015-10-09

## 2015-10-03 NOTE — Plan of Care (Signed)
Pt intermittently observed on unit though tended to stay isolated to self in room.  Pt refused to attend wrap up group.   Pt stated she has been trying to get in touch with her family but cant.  RN offered to assist pt but pt refused.  Pt agreeable to allow RN to assess vital signs but refused for temperature to be obtained.  Pt continues to be guarded and vague with answers.  Denies SI,HI,AVH.

## 2015-10-03 NOTE — Progress Notes (Signed)
Patient declined vital signs at [phone].

## 2015-10-03 NOTE — Plan of Care (Signed)
Problem: Health Promotion  Goal: Knowledge - disease process  Extent of understanding conveyed about a specific disease process.   Outcome: Completed Date Met:  10/03/15  Patient states she does not have any questions about her illness or why she is here.  Goal: Knowledge - health resources  Extent of understanding and conveyed about healthcare resources.   Outcome: Progressing  Patient states she will follow up with all post [place] care.    Problem: Loss of functioning (Thought Disorder, Mood Disturbance and/or Severe Anxiety) AS EVIDENCED BY...  Goal: Attends a minimum number of therapies daily  Outcome: Not Progressing  Patient is not attending most groups.  Goal: Reports improved mood  Outcome: Progressing  Patient states her mood is "good."  Goal: Verbalizes reduced anxiety  Outcome: Progressing  Patient denies any anxiety.

## 2015-10-03 NOTE — Progress Notes (Signed)
No medications given 7pm - 7am shift.

## 2015-10-03 NOTE — Plan of Care (Signed)
Problem: Safety  Goal: Patient will be free from injury during hospitalization  Pt will verbalize 3 benefits of complying with medications as prescribed by day 5.   Outcome: Completed Date Met:  10/03/15  Patient has not reported any injuries.    Problem: Psychosocial and Spiritual Needs  Goal: Demonstrates ability to cope with hospitalization/illness  Pt will identify at least 3 coping strategies to be utilized in recovery plan by day 5. Pt will verbalize that anxiety provoking situations are manageable in three out of 4 episodes by day 5   Outcome: Completed Date Met:  10/03/15  Patient has neither displayed or reported any problems coping with hospitalization.    Problem: Loss of functioning (Thought Disorder, Mood Disturbance and/or Severe Anxiety) AS EVIDENCED BY...  Goal: Will remain safe during hospitalization  Outcome: Completed Date Met:  10/03/15  Patient has remained safe during hospitalization.  Goal: Verbalizes understanding of medication, benefits, and side effects  Outcome: Completed Date Met:  10/03/15  Patient states she does not have any questions about her medications.  Goal: Verbalizes reduction in hallucinations/delusions  Outcome: Completed Date Met:  10/03/15  Patient denies SI, HI and AVH.  Goal: Maintains adequate nutrition/hydration  Outcome: Completed Date Met:  10/03/15  Patient states she is getting enough to eat and drink.  Goal: Maintains adequate sleep/rest pattern  Outcome: Completed Date Met:  10/03/15  Patient states she is getting enough sleep and rest here.  Goal: Demonstrates decreased episodes of intrusive behavior / boundary violations  Outcome: Completed Date Met:  10/03/15  Patient is not displaying any intrusive behaviors.

## 2015-10-03 NOTE — Progress Notes (Addendum)
Kimberly Bowman,Kimberly Bowman, 45 y.o. female  Date of Birth:  [birth year]    IP BH Progress Note  Subjective:   RN: Isolative to bedroom, little interaction with peers and Little motivation noted by staff/observation.     S: "No one called me back". Patients behavior leading to getting lost now with resulting no support. No means including family, money to get back to home area. No side effects. Denies any side effects from medication, Reports gradual improvement in presenting symptoms and Denies SI/HI/AVH    Legal Status: CMA  Objective:   MENTAL STATUS EXAM:   Mood:  "I'm fine"  Affect:  Mood Congruent and Limited/Restricted Range  Thought Process:  Appropriately answers questions but with brief responses/underinclusion  Thought Content:  No Suicidal thoughts, intent or plan, No Homicidal thoughts, intent or plan and No delusional thought content  Perceptual Disturbance: No auditory, tacile, olfactory, visual hallucination  Insight/Judgment: Improving and Limited at baseline  Cognition:   Alert and Oriented to time, place and person    Medications:     Current Facility-Administered Medications   Medication Dose Route Frequency   . multivitamin  1 tablet Oral Daily   . ziprasidone  40 mg Oral BID Meals       Assessment / Plan:   History of schizophrenia; paranoid versus undifferentiated  History of alcohol abuse  History of poor compliance Compliance.      Overall patient seems to continue with improving from baseline although no safe DCP in place. No overt behaviors warranting further detention. Has been compliant with medications.  Responding to current medication regimen and plan, continue current outlined plan and Encouarge group participation and unit programming..    Medical concerns:no acute concerns noted    Barriers to discharge: Lack of transportation, finances and support delaying [place].     Monitoring closely for side effects. Risks and benefits for recommended therapy was discussed.     Patient verbally  acknowledged understanding and agreed to the aforementioned treatment plan.     [name], DO  10/03/2015 12:27 PM    Called family, Annette Stable listed contact: no answer. Left message to return phone call. Concerns about  without support/family in area far from home.

## 2015-10-03 NOTE — Plan of Care (Signed)
Problem: Loss of functioning (Thought Disorder, Mood Disturbance and/or Severe Anxiety) AS EVIDENCED BY...  Goal: Attends a minimum number of therapies daily  Outcome: Not Progressing  Pt continues to isolate to room intermittently coming out into unit though did not participate in wrap up group.  Goal: Reports improved mood  Outcome: Progressing  Pt observed smiling and laughing when talking with RN.  Pt stated she is feeling better and ready to go home.

## 2015-10-03 NOTE — Progress Notes (Signed)
Patient presents as bright and friendly.  Patient is alert and oriented x 4.  Patient is awake and visible on the unit.  Patient denies SI, HI and AVH.  Patient is social and interactive with peers.  Patient is polite, pleasant and cooperative with staff.  Patient is attending some groups and participating appropriately.  Patient states she is trying to contact her family because she has a place to go, but no way to get there.  All 7a-7p scheduled, PRN and first dose medications given with no adverse reactions or side effects observed or reported.  No management problem.

## 2015-10-04 NOTE — Progress Notes (Signed)
Kimberly Bowman,Kimberly Bowman, 45 y.o. female  Date of Birth:  12-18-1970    IP BH Progress Note  Subjective:   RN: Pleasant, states that she is continues to look for someone to pick her up from facility. States she has reached out to cousins to bring her clothing and pick her up on their voice mails. Mother is still alive but she states that her mother has dementia and is not able to drive.    S: "I don't know, I've tried". Patient likely at baseline, but concerns are for after [place] safety being she has no support, finances and is far from home.. Denies any side effects from medication, Reports gradual improvement in presenting symptoms and Denies SI/HI/AVH    Legal Status: CMA  Objective:   MENTAL STATUS EXAM:   Mood:  "I'm fine"  Affect:  Mood Congruent and Limited/Restricted Range  Thought Process:  Appropriately answers questions but with brief responses/underinclusion  Thought Content:  No Suicidal thoughts, intent or plan, No Homicidal thoughts, intent or plan and No delusional thought content  Perceptual Disturbance: No auditory, tacile, olfactory, visual hallucination  Insight/Judgment: Improving and Limited at baseline  Cognition:   Alert and Oriented to time, place and person    Medications:     Current Facility-Administered Medications   Medication Dose Route Frequency   . multivitamin  1 tablet Oral Daily   . ziprasidone  40 mg Oral BID Meals       Assessment / Plan:   History of schizophrenia; paranoid versus undifferentiated  History of alcohol abuse  History of poor compliance Compliance.      Overall patient likely at baseline although no safe DCP in place. Responding to current medication regimen and plan, continue current outlined plan and Encouarge group participation and unit programming..    Medical concerns:no acute concerns noted    Barriers to discharge: Social/transportation. RN/Therapist have assisted patient with calls without success with ensuring safety on [place].    Monitoring closely for side  effects. Risks and benefits for recommended therapy was discussed.     Patient verbally acknowledged understanding and agreed to the aforementioned treatment plan.     Kimberly Sousa, DO  10/04/2015 1:35 PM    Called Velna Hatchet, contact listed at "agency" for assistance on DCP/Transportation. No answer.

## 2015-10-04 NOTE — Plan of Care (Signed)
Problem: Loss of functioning (Thought Disorder, Mood Disturbance and/or Severe Anxiety) AS EVIDENCED BY...  Goal: Attends a minimum number of therapies daily  Outcome: Progressing  Pt attending and participating in groups  Goal: Reports improved mood  Outcome: Progressing  Pt stated she feels great and is ready to go

## 2015-10-04 NOTE — Progress Notes (Signed)
No medications given 7pm - 7am shift.

## 2015-10-04 NOTE — Plan of Care (Signed)
Pt intermittently visible on the unit though remains somewhat isolative to herself.  Pt stated she has left multiple messages for her family and no one has called her back.  RN offered to assist patient in calling her family but patient refused.  Pt also refused vital signs as well stating they were already done today.  When pt informed that she had refused earlier and they were not done pt stated that's not true and still would not allow vitals to be performed.  Pt remains vague and guarded with answers.  Pt denies SI,HI,AVH.

## 2015-10-04 NOTE — Plan of Care (Signed)
Problem: Loss of functioning (Thought Disorder, Mood Disturbance and/or Severe Anxiety) AS EVIDENCED BY...  Goal: Attends a minimum number of therapies daily  Outcome: Progressing  Has not attended group but has remained more present on the unit, coloring and listening to music with fellow patients.   Goal: Reports improved mood  Outcome: Progressing  Reports better mood.   Goal: Verbalizes reduced anxiety  Outcome: Progressing  States anxiety is reduced.   Goal: Completes discharge safety and recovery plan  Outcome: Progressing  Most items completed on d/c safety plan except phone numbers of support persons and signature.

## 2015-10-04 NOTE — Progress Notes (Signed)
Assumed care this am.  Pleasant, states that she is continues to look for someone to pick her up from facility. States she has reached out to cousins to bring her clothing and pick her up on their voice mails. Mother is still alive but she states that her mother has dementia and is not able to drive.  Remains isolative to room , however has been more present on the unit then last week . Continues to refuse that vital signs be taken.

## 2015-10-04 NOTE — Plan of Care (Signed)
Problem: Loss of functioning (Thought Disorder, Mood Disturbance and/or Severe Anxiety) AS EVIDENCED BY...  Goal: Reports improved mood  Outcome: Progressing  Intervention: Encourage the patient to verbalize feelings of anxiety, anger, and fear  Patient denies anxiety, anger and fear.   Intervention: Frequent brief 1:1 conversations with the patient to assess mood /coping responses  Spoke w/ patient regarding post discharge care and taking medications as prescribed. Completed discharge safety care plan. Patient does not voice understanding.     Goal: Completes discharge safety and recovery plan  Intervention: Involve the patients family/support person regarding discharge planning process  Unable to locate a point of contact. Continue to look for person who can assist with transportation , etc.. Upon discharge.   Intervention: Address availability of firearms in home environment with family and patient  Denies such things are in home.

## 2015-10-04 NOTE — Progress Notes (Signed)
Patient visible on unit this pm.  Took all 7a-7pm medications without ill effect.

## 2015-10-05 NOTE — Plan of Care (Signed)
Problem: Loss of functioning (Thought Disorder, Mood Disturbance and/or Severe Anxiety) AS EVIDENCED BY...  Goal: Reports improved mood  Outcome: Progressing  Pt reports an improved mood.

## 2015-10-05 NOTE — Progress Notes (Addendum)
Pt has been visible at times in the dayroom. Pt came up after dinner and said she had a ride to go home today. This Clinical research associate let PT know that there was no order for discharge from the doctor and that she would need to let MD know in the morning. Pt has been med compliant and cooperative. Pt denies SI, HI, AVH. Will continue to monitor.  All 7am-7pm medications including first dose administrations given without any reported or observed side effects or adverse reactions.

## 2015-10-05 NOTE — Progress Notes (Signed)
Kimberly Bowman,Kimberly Bowman, 45 y.o. female  Date of Birth:  24-Sep-1970    IP BH Progress Note  Subjective:   S: "I talked to my friend in [place], he is going to try to pick me up today". Patient at baseline, but concerns are for after [place] safety being she has no support, finances and is far from home.. Denies any side effects from medication, Reports gradual improvement in presenting symptoms and Denies SI/HI/AVH    Legal Status: CMA  Objective:   MENTAL STATUS EXAM:   Mood:  "I'm thought good"  Affect:  Mood Congruent and Limited  Thought Process:  Appropriately answers questions but with brief responses/underinclusion  Thought Content:  No Suicidal thoughts, intent or plan, No Homicidal thoughts, intent or plan and No delusional thought content  Perceptual Disturbance: No auditory, tacile, olfactory, visual hallucination  Insight/Judgment: Improving and Limited at baseline  Cognition:   Alert and Oriented to time, place and person    Medications:     Current Facility-Administered Medications   Medication Dose Route Frequency   . multivitamin  1 tablet Oral Daily   . ziprasidone  40 mg Oral BID Meals       Assessment / Plan:   History of schizophrenia; paranoid versus undifferentiated  History of alcohol abuse  History of poor compliance Compliance.      Overall patient likely at baseline although no safe DCP in place. Responding to current medication regimen and plan, continue current outlined plan and Encouarge group participation and unit programming..    Medical concerns:no acute concerns noted    Barriers to discharge: Social/transportation. Writer/RN/Therapist have assisted patient with calls without success with ensuring safety on [place].    Monitoring closely for side effects. Risks and benefits for recommended therapy was discussed.     Patient verbally acknowledged understanding and agreed to the aforementioned treatment plan.     [name], DO  10/05/2015 9:57 AM

## 2015-10-05 NOTE — Progress Notes (Signed)
Understanding pt has been guarded about family contacts, I spoke with her today and assisted her in identifying and then locating the phone numbers for a relative, one Kimberly Bowman.  Her cell phone number is [phone].  There may be a home phone as well [phone]), though this number rings to a full mailbox.  The cell phone does have an active mailbox.  A message was left asking Ms. Kimberly Bowman to call the unit.    The patient has twice given me verbal permission to speak with this relative.      Kimberly Bowman, Psy.D.

## 2015-10-06 NOTE — Progress Notes (Signed)
No meds given 7pm - 7am shift.

## 2015-10-06 NOTE — Progress Notes (Signed)
Patient declined morning vital signs.

## 2015-10-06 NOTE — Plan of Care (Signed)
Problem: Loss of functioning (Thought Disorder, Mood Disturbance and/or Severe Anxiety) AS EVIDENCED BY...  Goal: Attends a minimum number of therapies daily  Outcome: Not Progressing  Goal: Reports improved mood  Outcome: Progressing  Pt stated she is feeling better and ready to go home
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
## 2015-10-06 NOTE — Plan of Care (Signed)
Pt remained isolative to room laying on bed in the dark writing.   When offered to turn the light on pt refused.  Pt stated she was able to get in touch with her family and will have a ride tomorrow.  RN encouraged pt to give this information to the doctor and nurse tomorrow.  Pt denies SI,HI,AVH.

## 2015-10-06 NOTE — Progress Notes (Signed)
Tolerated all 7a -7pm medications without ill effect.
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
## 2015-10-06 NOTE — Discharge Summary (Deleted)
DISCHARGE SUMMARY    Patient Name: Kimberly Bowman  Attending Physician: [name], DO  Primary Care Physician: Christa See, MD    Date of Admission: 09/27/2015  Date of Discharge: 10/06/2015  Length of Stay in the Hospital: 9    Reason for Admission:  (Brief description of HPI)  From admission history of present illness:  CC: "I'm an adult, I don't know why people would say to him a missing person, I was going to [employer] to do business".     HPI: Patient is a 45 year old female unknown to this area, hospital with reported history of paranoid schizophrenia, listed as a missing person and brought in under a temporary detention order by police. Notes indicate patient lives in a group home and left, family/staff reported her as a missing person. Somewhat irritable, notes indicate patient was hostile at answering certain questions in the emergency room. Calm and cooperative, no behavioral disturbances on the unit.    Patient minimizes situation, says police found her at [employer] and she reported being lost and they brought her to the emergency room. She denies mental health history other than depression to staff, stated that she was traveling to [place] to be in the [employer], today telling me that she went to [employer] for some business. Very vague, nondescriptive, very little context to her travels.    Denies depression, mania as well as psychosis. Does not seem irritable, states that she is "doing a lot better today". Endorses a history of depression, stated that she is been prescribed Wellbutrin without effect 8 years ago. Stated that in remote history she had what she would call a manic episode but had been started on Risperdal without compliance. Denies any recent similar behaviors. No objective findings of mania at this time. Denies any auditory or visual hallucinations.    Noted by prescreen, patient has been on disability for quite some time. It was reported patient  lives in a group home, has a Sports coach with a diagnosis of paranoid schizophrenia, noncompliant, refuses medications. 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    PAST PSYCHIATRIC/SUBSTANCE HISTORY: Denies, case manager reports paranoid schizophrenia, history of inpatient hospitalization, noncompliance. No outpatient treatment at this time, denies any recent medications. Social history includes DWI on or near February of this month. Denies any recent use.    Diagnosis on Discharge (with any noted changes from admission diagnosis):     History of paranoid schizophrenia  History of alcohol abuse  History of noncompliance      Discharge Medications:     Medication List      START taking these medications          ziprasidone 40 MG capsule4 psychosis   Commonly known as:  GEODON   Take 1 capsule (40 mg total) by mouth 2 (two) times daily with meals.         CONTINUE taking these medications          multivitamin with minerals tablet            Where to Get Your Medications      You can get these medications from any pharmacy     Bring a paper prescription for each of these medications    - ziprasidone 40 MG capsule          Admission H&P summary: (See full History and Physical for details.)    Consultations:  Treatment  Team: Attending Provider: [name], DO; Registered Nurse: Debbe Bales, RN; Case Manager: Charm Barges, RN; Registered Nurse: Monico Blitz, RN; Technician: Doralee Albino, [facility] Course:   Patient was admitted under TDO but during hearing, accepted a Court Mandated Admission of 3-5 days status. Patient was cooperative with evaluation and treatment recommendations.     Psychopharmacology was initiated to address presenting symptoms reported in HPI:   Geodon for paranoia, thought disorder    Consults: No consults during hospital stay other the [facility] Intake in ED    Tobacco cessation: Not applicable.  Patient is not a smoker.     Antipsychotic (if on 2nd medication): Y or N. If yes, Please explain:   Not applicable.     Labs, if applicable:   Estimated body mass index is 31.09 kg/(m^2) as calculated from the following:    Height as of this encounter: 1.575 m (5\' 2" ).    Weight as of this encounter: 77.111 kg (170 lb).  Filed Vitals:    10/03/15 2100   BP: 122/91   Pulse: 98   Resp: 20   SpO2: 98%     No results found for: CHOL, TRIG, HDL, LDL, VLDL  No results found for: HGBA1CPERCNT  @LABCNTIP (WBC,HGB,HCT,PLT,ADIFF)@  @LABCNTIP (alb,prot,bilitotal,alkphos,alt,ast)@  @LABCNTIP (glu,NA,K,CL,CO2,BUN,CREAT,egfr,CA)@    The patient was monitored for effectiveness and side effects through hospital stay on daily basis. At times patient did not participate in group, individual therapy but interacted well with physician. Patient is expressing goals for recovery    At time of discharge, the patient denies any current worsening or active depressive, manic, psychotic or anxiety related symptoms. Has returned back to baseline level of functioning. Concerns over her compliance once discharge occurs is inevitable, patient has a strong history of same. The patient has not had any suicidal/homicidal thoughts intent or plan nor have they demonstrated signs of para-suicidal gestures or passive suicidal wishes.    Collateral: A number of attempts to coordinate discharge planning with family failed, no support or family members were able to be reached. Called her previous living situation,  They would not be able to facilitate transportation back.    Patient explains that she has financial means to get a taxi to pick up her license which is at a local police office, then Rent-A-Car and continue her travels to [place] to  Visit friends. Has not needed as needed medications, has not demonstrated any behavioral disturbance. Although patient was initially meeting criteria for temporary detention, patient does not meet criteria for  continued court mandated admission and has been seen by psychology to confirm.patient requesting to leave today, no grounds for petitioning an additional TDO.    Discharge MSE:  Mood:  "I'm doing good"  Affect:  Mood Congruent and Limited/Restricted Range  Thought Process:  Appropriately answers questions but with brief responses/underinclusion  Thought Content:  No Suicidal thoughts, intent or plan, No Homicidal thoughts, intent or plan and No delusional thought content  Perceptual Disturbance: No auditory, tacile, olfactory, visual hallucination  Insight/Judgment:  Limited at baseline    Discharge Condition:  returned to baseline level of functioning    Disposition:  Previous living situation  Follow Up: Transition clinic, on waiting list of [facility]    The patient was encouraged to remain complaint with both the outpatient psychiatric follow up and the current medications. Time spent coordinating discharge planning, reconciliation of medications, and reviewing discharge plan: >35 mins. The patient understood and agreed with  she is agreeable with the plan.

## 2015-10-06 NOTE — Progress Notes (Signed)
Assumed care this am . Pt states she is ready for d/c . Currently denies SI/HI or AVH. Plans for discharge on Monday.  Pt . did become slightly agitated this morning when talking to Dr. Anna Genre regarding d/c planning but quickly redirected. 0 acute needs noted at present.

## 2015-10-06 NOTE — Progress Note - Problem Oriented Charting Notewrit (Signed)
Per next care providers request, History & Physical, Labwork, Medication List, and FaceSheet were faxed 10/06/2015 2:30 PM to [facility].

## 2015-10-07 NOTE — Progress Notes (Signed)
[name],Simcha, 45 y.o. female  Date of Birth:  June 07, 1971    IP BH Progress Note  Subjective:   RN/Interval events:  Pt was present on unit, pleasant, no management problem. Pt attended wrap up group. Pt denied SI/HI/AVH, denied pain or physical discomfort. Pt refused to have vital signs taken. No medication given this shift.       Pt: states ": i am doing better". Sleep well. Thought blocking better .     Tolerating medications and Denies any side effects from medication  Concerns:  No concerns, attending groups, participating in treatment  Legal Status: INVOL  Objective:   Mental Status Exam:     Mood:  "I'm doing good"  Affect:  Mood Congruent and Normal Range  Thought Process: Goal Directed  Thought Content:  No Suicidal thoughts, intent or plan and No Homicidal thoughts, intent or plan  Perceptual Disturbance:  No auditory, tacile, olfactory, visual hallucination  Insight:  Fair and Improving  Judgment: Fair and Improving   Cognition:   Alert. Ox4    Medications:     Current Facility-Administered Medications   Medication Dose Route Frequency   . multivitamin  1 tablet Oral Daily   . ziprasidone  40 mg Oral BID Meals       Assessment and Plan:   Dx:  History of schizophrenia; paranoid versus undifferentiated  History of alcohol abuse  History of poor compliance Compliance.  Overall: patient has returned to baseline with regards to psychiatric symptomology. Continue to monitor for side effects. Encouraged full group attendance, maintain visibility on the unit. Patient was provided with supportive therapy with focus on psychopharmacology education and improving coping skills.     Medications/Adjustments/Indications: Responding to current medication regimen and plan, continue current outlined plan and Encouarge group participation and unit programming.    Medical comorbidities/treatments: none acute    Barriers to discharge: Start D/C Planning.    Risks and benefits for recommended therapy was  discussed. Patient verbally acknowledged understanding and agreed to the aforementioned treatment plan.     Walden Field, MD  10/07/2015 10:27 AM

## 2015-10-07 NOTE — Plan of Care (Signed)
Patient visible on unit, socializes with select peers. Patient refused to have her vitals signs taken, no explanation was given as to why. Patient can appear to be involved in coloring or watching tv but it seems she is not fully engaged in any activity and may be responding to internal stimuli. Patient was not given any meds this evening. Denies SI, HI and AVH.

## 2015-10-07 NOTE — Progress Notes (Signed)
This patient has been very pleasant she attends all groups she is social with select peers she at times appears to be preoccupied at times,she remains calm and quiet ,she has taken medications as ordered with no adverse side effects

## 2015-10-07 NOTE — Plan of Care (Signed)
Problem: Loss of functioning (Thought Disorder, Mood Disturbance and/or Severe Anxiety) AS EVIDENCED BY...  Goal: Attends a minimum number of therapies daily  Outcome: Not Progressing  Pt attended minimal amount of groups today (2).    Goal: Verbalizes reduced anxiety  Outcome: Progressing  Pt was pleasant, cooperative, denied discomfort.

## 2015-10-07 NOTE — Plan of Care (Addendum)
7p-7a shift:  Pt was present on unit, pleasant, no management problem.  Pt attended wrap up group.  Pt denied SI/HI/AVH, denied pain or physical discomfort. Pt refused to have vital signs taken.  No medication given this shift.       7p-7a:  No medications given this shift;  no reported side effects or adverse reactions from previous administrations. 7p-7a: All medications, including first doses, given with no reported side effects or adverse reactions.

## 2015-10-08 NOTE — Progress Notes (Signed)
Patient has been out in dayroom but isolates to herself with no socialization she does expect to go home tomorrow she has taken medications with no adverse side effects noted

## 2015-10-08 NOTE — Progress Notes (Signed)
Progress Note/SOAP Note    Subjective:   Any Comments/Concerns/Issues: Reports gradual improvement in presenting symptoms and Tolerating medications and treatment well      Objective:   MSE:  General Appearance: Appears stated age, no psychomotor retardation, no psychomotor agitation, no Abnormal body movement noted and Appropriately Attired\  Speech:  Spontaneous and Not pressured  Mood:  "good"  Affect:  Mood Congruent and Normal Range  Thought Process:  Goal Directed  Thought Content:  No suicidal thought, intent, plan and No homicidal thought, intent, plan  Perceptual Disturbance:  No audio, tacile, olfactory, visual hallucination  Insight:  Improving  Judgment: Improving   Cognition:   Alert, Oriented to time, place and person and Attention Fair    Medications:   Allergies:  Oxycodone    Current Facility-Administered Medications   Medication Dose Route Frequency   . multivitamin  1 tablet Oral Daily   . ziprasidone  40 mg Oral BID Meals       Assessment / Plan / Goals:    Dx:  History of schizophrenia; paranoid versus undifferentiated  History of alcohol abuse  Initial diagnosis and treatment plan by the Pr./Admitting Psychiatrist.  Mild improvement in symptoms and responding to current treatment modalities.  Continues to respond to treatment with gradually improving symptoms.  Benefits and side effects of medications discussed.  Psychoeducation and supportive Psychotherapy provided.  Continue current Psychotropic Medications.        Barriers to Discharge: Start D/C Planning. For Mon.     Patient understood and agrees with plan.    Walden Field, MD  10/08/2015 11:18 AM

## 2015-10-08 NOTE — Progress Notes (Signed)
Pt writing a story refuses vital signs pleasant voices no complaints

## 2015-10-08 NOTE — Progress Notes (Signed)
All 1900 to 0700 medications administered including 1st doses without any adverse reaction noted.

## 2015-10-09 MED ORDER — ZIPRASIDONE HCL 40 MG PO CAPS
40.0000 mg | ORAL_CAPSULE | Freq: Two times a day (BID) | ORAL | Status: AC
Start: 2015-10-09 — End: ?

## 2015-10-09 NOTE — Progress Notes (Signed)
Left VM for Dia Sitter twice to inquire about picking up pt for discharge.

## 2015-10-09 NOTE — Discharge Summary (Signed)
DISCHARGE SUMMARY    Patient Name: Kimberly Bowman,Kimberly Bowman  Attending Physician: [name], DO  Primary Care Physician: Christa See, MD    Date of Admission: 09/27/2015  Date of Discharge: 10/06/2015  Length of Stay in the Hospital: 13    Reason for Admission: (Brief description of HPI)  From admission history of present illness:  CC: "I'm an adult, I don't know why people would say to him a missing person, I was going to [employer] to do business".     HPI: Patient is a 45 year old female unknown to this area, hospital with reported history of paranoid schizophrenia, listed as a missing person and brought in under a temporary detention order by police. Notes indicate patient lives in a group home and left, family/staff reported her as a missing person. Somewhat irritable, notes indicate patient was hostile at answering certain questions in the emergency room. Calm and cooperative, no behavioral disturbances on the unit.    Patient minimizes situation, says police found her at [employer] and she reported being lost and they brought her to the emergency room. She denies mental health history other than depression to staff, stated that she was traveling to [place] to be in the [employer], today telling me that she went to [employer] for some business. Very vague, nondescriptive, very little context to her travels.    Denies depression, mania as well as psychosis. Does not seem irritable, states that she is "doing a lot better today". Endorses a history of depression, stated that she is been prescribed Wellbutrin without effect 8 years ago. Stated that in remote history she had what she would call a manic episode but had been started on Risperdal without compliance. Denies any recent similar behaviors. No objective findings of mania at this time. Denies any auditory or visual hallucinations.    Noted by prescreen, patient has been on disability for quite some time. It was reported patient  lives in a group home, has a Sports coach with a diagnosis of paranoid schizophrenia, noncompliant, refuses medications. 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    PAST PSYCHIATRIC/SUBSTANCE HISTORY: Denies, case manager reports paranoid schizophrenia, history of inpatient hospitalization, noncompliance. No outpatient treatment at this time, denies any recent medications. Social history includes DWI on or near February of this month. Denies any recent use.    Diagnosis on Discharge (with any noted changes from admission diagnosis):   History of paranoid schizophrenia  History of alcohol abuse  History of noncompliance      Discharge Medications:     Medication List      START taking these medications         ziprasidone 40 MG capsule4 psychosis   Commonly known as: GEODON   Take 1 capsule (40 mg total) by mouth 2 (two) times daily with meals.         CONTINUE taking these medications         multivitamin with minerals tablet            Where to Get Your Medications      You can get these medications from any pharmacy     Bring a paper prescription for each of these medications    - ziprasidone 40 MG capsule          Admission H&P summary: (See full History and Physical for details.)    Consultations:  Treatment Team: Attending Provider: [name],  DO; Registered Nurse: Debbe Bales, RN; Case Manager: Charm Barges, RN; Registered Nurse: Monico Blitz, RN; Technician: Doralee Albino, [facility] Course:   Patient was admitted under TDO but during hearing, accepted a Court Mandated Admission of 3-5 days status. Patient was cooperative with evaluation and treatment recommendations.     Psychopharmacology was initiated to address presenting symptoms reported in HPI:   Geodon for paranoia, thought disorder    Consults: No consults during hospital stay other the [facility] Intake in  ED    Tobacco cessation: Not applicable. Patient is not a smoker.     Antipsychotic (if on 2nd medication): Y or N. If yes, Please explain:   Not applicable.     Labs, if applicable:   Estimated body mass index is 31.09 kg/(m^2) as calculated from the following:   Height as of this encounter: 1.575 m (5\' 2" ).   Weight as of this encounter: 77.111 kg (170 lb).   Vitals     Filed Vitals:    10/03/15 2100   BP: 122/91   Pulse: 98   Resp: 20   SpO2: 98%        No results found for: CHOL, TRIG, HDL, LDL, VLDL  No results found for: HGBA1CPERCNT  @LABCNTIP (WBC,HGB,HCT,PLT,ADIFF)@  @LABCNTIP (alb,prot,bilitotal,alkphos,alt,ast)@  @LABCNTIP (glu,NA,K,CL,CO2,BUN,CREAT,egfr,CA)@    The patient was monitored for effectiveness and side effects through hospital stay on daily basis. At times patient did not participate in group, individual therapy but interacted well with physician. Patient is expressing goals for recovery including taking medications.    At time of discharge, the patient denies any current worsening or active depressive, manic, psychotic or anxiety related symptoms. Has returned back to baseline level of functioning. Concerns over her compliance once discharge occurs is inevitable, patient has a strong history of same. The patient has not had any suicidal/homicidal thoughts intent or plan nor have they demonstrated signs of para-suicidal gestures or passive suicidal wishes.    Collateral: A number of attempts to coordinate discharge planning with family failed, no support or family members were able to be reached. Called her previous living situation, [facility] will be able to pick her up today for safety [place] plan.    Patient explains that she has financial means to get a taxi to pick up her license which is at a local police office, then Rent-A-Car and continue her travels to [place] to Visit friends. Has not needed as needed medications, has not demonstrated any behavioral disturbance.  Although patient was initially meeting criteria for temporary detention, patient does not meet criteria for continued court mandated admission. [place] to [facility] assistance.     Discharge MSE:  Mood: "I'm doing ok, I'm ready"  Affect: Mood Congruent and Limited/Restricted Range  Thought Process: Appropriately answers questions but with brief responses/underinclusion  Thought Content: No Suicidal thoughts, intent or plan, No Homicidal thoughts, intent or plan and No delusional thought content  Perceptual Disturbance: No auditory, tacile, olfactory, visual hallucination  Insight/Judgment: Limited at baseline    Discharge Condition: returned to baseline level of functioning    Disposition: Previous living situation  Follow Up: Transition clinic, on waiting list of [facility]    The patient was encouraged to remain complaint with both the outpatient psychiatric follow up and the current medications. Time spent coordinating discharge planning, reconciliation of medications, and reviewing discharge plan: >35 mins. The patient understood and agreed with the plan.

## 2015-10-09 NOTE — Progress Notes (Signed)
No one has arrived to pick patient up as of time of this note. All 7a-7p meds including 1st doses tolerated with no adverse reaction noted.

## 2015-10-09 NOTE — Progress Notes (Signed)
Pt refused morning vitals but is med compliant. Denies SI, HI and AVH. Visible in dayroom at times. No peer socialization noted. Compliant with completing discharge paperwork.

## 2015-10-09 NOTE — Progress Notes (Signed)
Pt rested quietly through the night shift uneventful all medications given 1900 to 0700 effective with no adverse reactions

## 2015-10-09 NOTE — Progress Notes (Signed)
Left additional message for [facility], no call back. Called [facility] to inquire if there was another number; was given [phone] number disconnected.

## 2015-10-09 NOTE — Plan of Care (Signed)
Pt is alert and oriented and denies SI, HI and AVH. Completing discharge paperwork at this time.

## 2015-10-10 NOTE — Progress Notes (Signed)
Spoke with Mora Appl from [facility] who reports that Kimberly Bowman was out taking care of her mother yesterday which is why she did not respond to staff requests to pick up pt. Eber Jones reports that she will talk to her boss this morning to see if she can come up to pick up the pt and call this clinician back with the answer.

## 2015-10-10 NOTE — Progress Notes (Addendum)
Patient visible on milieu. Pleasant, cooperative, makes good eye contact. Refused for VS to be taken. Mostly Isolative to her room except for mealtime. Denies SI, HI, AVH.

## 2015-10-10 NOTE — Progress Notes (Signed)
Pt is visble on the unit, attends qroup. She is bright, refuses any prn medications for sleep. She is vague, sleeps in her clothes, oddly did not question regarding her ride/ discharge. No calls or show from person to transport pt. Discharge will have to be rescheduled for ?2/28.

## 2015-10-10 NOTE — Progress Notes (Signed)
[name],Ariell, 45 y.o. female  Date of Birth:  01/16/1971    IP BH Progress Note      Subjective:   S: No questions or concerns. ?Motivation for leaving. Therapist attemting to coordinate DCP with previous [facility] delayed. Denies any side effects from medication, Reports gradual improvement in presenting symptoms and Denies SI/HI/AVH.     Legal Status: CMA  Objective:   MENTAL STATUS EXAM:   Mood:  "I'm okay"  Affect:  Mood Congruent and Limited  Thought Process:  Appropriately answers questions but with brief responses/underinclusion  Thought Content:  No Suicidal thoughts, intent or plan, No Homicidal thoughts, intent or plan and No delusional thought content  Perceptual Disturbance: No auditory, tacile, olfactory, visual hallucination  Insight/Judgment: Improving and Limited at baseline  Cognition:   Alert and Oriented to time, place and person    Medications:     Current Facility-Administered Medications   Medication Dose Route Frequency   . multivitamin  1 tablet Oral Daily   . ziprasidone  40 mg Oral BID Meals       Assessment / Plan:   History of schizophrenia; paranoid versus undifferentiated  History of alcohol abuse  History of poor compliance Compliance.      Overall patient likely at baseline although no safe DCP in place. Responding to current medication regimen and plan, continue current outlined plan and [place] group participation and unit programming.    Medical concerns:no acute concerns noted    Barriers to discharge: Delay in DCP    Monitoring closely for side effects. Risks and benefits for recommended therapy was discussed.     Patient verbally acknowledged understanding and agreed to the aforementioned treatment plan.     [name], DO  10/10/2015 11:57 AM

## 2015-10-10 NOTE — Progress Notes (Signed)
All 7AM- 7PM medications including first dose administrations given without any reported or observed side effects or adverse reactions

## 2015-10-11 NOTE — Progress Notes (Signed)
Reviewed [place] instructions including prescriptions, F/U information, medication information and safety plan with patient who verbalized understanding and all questions were answered. Pt escorted off BHS with all personal belongings, [place] paperwork and prescriptions to case managers from [facility]. All  meds from 0700 until time of this note including 1st doses tolerated with no adverse reaction noted.

## 2015-10-11 NOTE — Discharge Summary (Signed)
DISCHARGE SUMMARY    Patient Name: Kimberly Bowman,Kimberly Bowman  Attending Physician: [name], DO  Primary Care Physician: Christa See, MD    Date of Admission: 09/27/2015  Date of Discharge: 10/06/2015  Length of Stay in the Hospital: 15    Reason for Admission: (Brief description of HPI)  From admission history of present illness:  CC: "I'm an adult, I don't know why people would say to him a missing person, I was going to [employer] to do business".     HPI: Patient is a 45 year old female unknown to this area, hospital with reported history of paranoid schizophrenia, listed as a missing person and brought in under a temporary detention order by police. Notes indicate patient lives in a group home and left, family/staff reported her as a missing person. Somewhat irritable, notes indicate patient was hostile at answering certain questions in the emergency room. Calm and cooperative, no behavioral disturbances on the unit.    Patient minimizes situation, says police found her at [employer] and she reported being lost and they brought her to the emergency room. She denies mental health history other than depression to staff, stated that she was traveling to [place] to be in the [employer], today telling me that she went to [employer] for some business. Very vague, nondescriptive, very little context to her travels.    Denies depression, mania as well as psychosis. Does not seem irritable, states that she is "doing a lot better today". Endorses a history of depression, stated that she is been prescribed Wellbutrin without effect 8 years ago. Stated that in remote history she had what she would call a manic episode but had been started on Risperdal without compliance. Denies any recent similar behaviors. No objective findings of mania at this time. Denies any auditory or visual hallucinations.    Noted by prescreen, patient has been on disability for quite some time. It was reported patient  lives in a group home, has a Sports coach with a diagnosis of paranoid schizophrenia, noncompliant, refuses medications. 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    PAST PSYCHIATRIC/SUBSTANCE HISTORY: Denies, case manager reports paranoid schizophrenia, history of inpatient hospitalization, noncompliance. No outpatient treatment at this time, denies any recent medications. Social history includes DWI on or near February of this month. Denies any recent use.    Diagnosis on Discharge (with any noted changes from admission diagnosis):   History of paranoid schizophrenia  History of alcohol abuse  History of noncompliance      Discharge Medications:     Medication List      START taking these medications         ziprasidone 40 MG capsule4 psychosis   Commonly known as: GEODON   Take 1 capsule (40 mg total) by mouth 2 (two) times daily with meals.         CONTINUE taking these medications         multivitamin with minerals tablet            Where to Get Your Medications      You can get these medications from any pharmacy     Bring a paper prescription for each of these medications    - ziprasidone 40 MG capsule          Admission H&P summary: (See full History and Physical for details.)    Consultations:  Treatment Team: Attending Provider: [name],  DO; Registered Nurse: Debbe Bales, RN; Case Manager: Charm Barges, RN; Registered Nurse: Monico Blitz, RN; Technician: Doralee Albino, [facility] Course:   Patient was admitted under TDO but during hearing, accepted a Court Mandated Admission of 3-5 days status. Patient was cooperative with evaluation and treatment recommendations.     Psychopharmacology was initiated to address presenting symptoms reported in HPI:   Geodon for paranoia, thought disorder    Consults: No consults during hospital stay other  the [facility] Intake in ED    Tobacco cessation: Not applicable. Patient is not a smoker.     Antipsychotic (if on 2nd medication): Y or N. If yes, Please explain:   Not applicable.     Labs, if applicable:   Estimated body mass index is 31.09 kg/(m^2) as calculated from the following:   Height as of this encounter: 1.575 m (5\' 2" ).   Weight as of this encounter: 77.111 kg (170 lb).   Vitals     Filed Vitals:    10/03/15 2100   BP: 122/91   Pulse: 98   Resp: 20   SpO2: 98%        No results found for: CHOL, TRIG, HDL, LDL, VLDL  No results found for: HGBA1CPERCNT  @LABCNTIP (WBC,HGB,HCT,PLT,ADIFF)@  @LABCNTIP (alb,prot,bilitotal,alkphos,alt,ast)@  @LABCNTIP (glu,NA,K,CL,CO2,BUN,CREAT,egfr,CA)@    The patient was monitored for effectiveness and side effects through hospital stay on daily basis. At times patient did not participate in group, individual therapy but interacted well with physician. Patient is expressing goals for recovery including taking medications.    At time of discharge, the patient denies any current worsening or active depressive, manic, psychotic or anxiety related symptoms. Has returned back to baseline level of functioning. Concerns over her compliance once discharge occurs is inevitable, patient has a strong history of same. The patient has not had any suicidal/homicidal thoughts intent or plan nor have they demonstrated signs of para-suicidal gestures or passive suicidal wishes.    Collateral: A number of attempts to coordinate discharge planning with family failed, no support or family members were able to be reached. Called her previous living situation, [facility] will be able to pick her up today for safety [place] plan.    Patient explains that she has financial means to get a taxi to pick up her license which is at a local police office, then Rent-A-Car and continue her travels to [place] to Visit friends. Has not needed as needed medications, has not demonstrated  any behavioral disturbance. Although patient was initially meeting criteria for temporary detention, patient does not meet criteria for continued court mandated admission.  to CSB assistance.     Discharge MSE:  Mood:euthymic  Affect: Mood Congruent and Limited/Restricted Range  Thought Process: Appropriately answers questions but with brief responses/underinclusion  Thought Content: No Suicidal thoughts, intent or plan, No Homicidal thoughts, intent or plan and No delusional thought content  Perceptual Disturbance: No auditory, tacile, olfactory, visual hallucination  Insight/Judgment: Limited at baseline    Discharge Condition: returned to baseline level of functioning    Disposition: Previous living situation  Follow Up: [facility]    The patient was encouraged to remain complaint with both the outpatient psychiatric follow up and the current medications. Time spent coordinating discharge planning, reconciliation of medications, and reviewing discharge plan: >35 mins. The patient understood and agreed with the plan.

## 2015-10-11 NOTE — Plan of Care (Signed)
Problem: Health Promotion  Goal: Knowledge - health resources  Extent of understanding and conveyed about healthcare resources.   Outcome: Progressing  Pt working on d/c safety plan, in progress    Problem: Loss of functioning (Thought Disorder, Mood Disturbance and/or Severe Anxiety) AS EVIDENCED BY...  Goal: Attends a minimum number of therapies daily  Attending groups  Goal: Reports improved mood  Pt is bright, positive, voicing no c/o  Goal: Verbalizes reduced anxiety  Denies anx

## 2015-10-11 NOTE — Progress Notes (Addendum)
Pt is alert and oriented, denies SI, HI and AVH. Pt refused morning vitals but is med compliant. Visible in dayroom, making several phone calls. Will monitor.

## 2015-10-17 NOTE — Progress Notes (Signed)
Physicians Discharge Summary has been routed to next level of care at [facility].

## 2017-08-27 DIAGNOSIS — Z79899 Other long term (current) drug therapy: Secondary | ICD-10-CM | POA: Diagnosis not present

## 2017-08-27 DIAGNOSIS — F39 Unspecified mood [affective] disorder: Secondary | ICD-10-CM | POA: Diagnosis not present

## 2017-11-18 DIAGNOSIS — N390 Urinary tract infection, site not specified: Secondary | ICD-10-CM | POA: Diagnosis not present

## 2017-11-18 DIAGNOSIS — R358 Other polyuria: Secondary | ICD-10-CM | POA: Diagnosis not present

## 2017-11-18 DIAGNOSIS — L853 Xerosis cutis: Secondary | ICD-10-CM | POA: Diagnosis not present

## 2017-11-18 DIAGNOSIS — B351 Tinea unguium: Secondary | ICD-10-CM | POA: Diagnosis not present

## 2018-01-17 DIAGNOSIS — F209 Schizophrenia, unspecified: Secondary | ICD-10-CM | POA: Diagnosis not present

## 2018-01-17 DIAGNOSIS — Z9119 Patient's noncompliance with other medical treatment and regimen: Secondary | ICD-10-CM | POA: Diagnosis not present

## 2018-01-17 DIAGNOSIS — Z9114 Patient's other noncompliance with medication regimen: Secondary | ICD-10-CM | POA: Diagnosis not present

## 2018-01-17 DIAGNOSIS — Z8659 Personal history of other mental and behavioral disorders: Secondary | ICD-10-CM | POA: Diagnosis not present

## 2018-01-17 DIAGNOSIS — N3281 Overactive bladder: Secondary | ICD-10-CM | POA: Diagnosis not present

## 2018-01-17 DIAGNOSIS — F22 Delusional disorders: Secondary | ICD-10-CM | POA: Diagnosis not present

## 2018-01-17 DIAGNOSIS — F200 Paranoid schizophrenia: Secondary | ICD-10-CM | POA: Diagnosis not present

## 2018-01-17 DIAGNOSIS — R454 Irritability and anger: Secondary | ICD-10-CM | POA: Diagnosis not present

## 2018-01-19 DIAGNOSIS — N3281 Overactive bladder: Secondary | ICD-10-CM | POA: Insufficient documentation

## 2018-01-19 DIAGNOSIS — F209 Schizophrenia, unspecified: Secondary | ICD-10-CM | POA: Insufficient documentation

## 2018-02-10 ENCOUNTER — Other Ambulatory Visit: Payer: Self-pay

## 2018-02-10 NOTE — Patient Outreach (Signed)
[facility] Care Management  02/10/2018  Dollene L [name] 01/09/1971 [id number]  Transition of care  Referral date: 02/10/18 [facility] source[facility] Insurance: Health team advantage  Transition of care will be completed by primary care provider office who will [facility] if needed.   PLAN:  RNCM will close patient due to patient being enrolled in an external program.  George InaDavina Riya Huxford RN,BSN,CCM [facility] Telephonic  [phone]

## 2018-05-21 ENCOUNTER — Encounter (HOSPITAL_COMMUNITY): Payer: Self-pay

## 2018-05-21 ENCOUNTER — Emergency Department (HOSPITAL_COMMUNITY)
Admission: EM | Admit: 2018-05-21 | Discharge: 2018-05-21 | Disposition: A | Discharge status: Home / Self Care | Payer: PPO | Attending: Emergency Medicine | Admitting: Emergency Medicine

## 2018-05-21 ENCOUNTER — Encounter (HOSPITAL_COMMUNITY): Payer: Self-pay | Admitting: *Deleted

## 2018-05-21 ENCOUNTER — Other Ambulatory Visit: Payer: Self-pay

## 2018-05-21 ENCOUNTER — Emergency Department (EMERGENCY_DEPARTMENT_HOSPITAL)
Admission: EM | Admit: 2018-05-21 | Discharge: 2018-05-22 | Disposition: A | Discharge status: Other Acute Inpatient Hospital | Payer: PPO | Source: Home / Self Care | Attending: Emergency Medicine | Admitting: Emergency Medicine

## 2018-05-21 DIAGNOSIS — F329 Major depressive disorder, single episode, unspecified: Secondary | ICD-10-CM

## 2018-05-21 DIAGNOSIS — R6889 Other general symptoms and signs: Secondary | ICD-10-CM

## 2018-05-21 DIAGNOSIS — F332 Major depressive disorder, recurrent severe without psychotic features: Secondary | ICD-10-CM | POA: Insufficient documentation

## 2018-05-21 DIAGNOSIS — R45851 Suicidal ideations: Secondary | ICD-10-CM | POA: Insufficient documentation

## 2018-05-21 DIAGNOSIS — J111 Influenza due to unidentified influenza virus with other respiratory manifestations: Secondary | ICD-10-CM | POA: Diagnosis not present

## 2018-05-21 DIAGNOSIS — M791 Myalgia, unspecified site: Secondary | ICD-10-CM | POA: Diagnosis present

## 2018-05-21 DIAGNOSIS — F32A Depression, unspecified: Secondary | ICD-10-CM

## 2018-05-21 DIAGNOSIS — R410 Disorientation, unspecified: Secondary | ICD-10-CM | POA: Diagnosis not present

## 2018-05-21 HISTORY — DX: Depression, unspecified: F32.A

## 2018-05-21 HISTORY — DX: Major depressive disorder, single episode, unspecified: F32.9

## 2018-05-21 LAB — COMPREHENSIVE METABOLIC PANEL
ALT: 20 U/L (ref 0–44)
AST: 29 U/L (ref 15–41)
Albumin: 4.1 g/dL (ref 3.5–5.0)
Alkaline Phosphatase: 85 U/L (ref 38–126)
Anion gap: 13 (ref 5–15)
BILIRUBIN TOTAL: 1.1 mg/dL (ref 0.3–1.2)
BUN: 10 mg/dL (ref 6–20)
CALCIUM: 9.5 mg/dL (ref 8.9–10.3)
CO2: 20 mmol/L — ABNORMAL LOW (ref 22–32)
CREATININE: 0.74 mg/dL (ref 0.44–1.00)
Chloride: 102 mmol/L (ref 98–111)
GFR calc Af Amer: >60 mL/min (ref >60)
Glucose, Bld: 75 mg/dL (ref 70–99)
Potassium: 3.5 mmol/L (ref 3.5–5.1)
Sodium: 135 mmol/L (ref 135–145)
TOTAL PROTEIN: 7.6 g/dL (ref 6.5–8.1)

## 2018-05-21 LAB — CBC WITH DIFFERENTIAL/PLATELET
ABS IMMATURE GRANULOCYTES: 0.01 10*3/uL (ref 0.00–0.07)
Basophils Absolute: 0 10*3/uL (ref 0.0–0.1)
Basophils Relative: 0 %
EOS ABS: 0.1 10*3/uL (ref 0.0–0.5)
Eosinophils Relative: 2 %
HEMATOCRIT: 40.3 % (ref 36.0–46.0)
Hemoglobin: 12.8 g/dL (ref 12.0–15.0)
IMMATURE GRANULOCYTES: 0 %
LYMPHS ABS: 1.5 10*3/uL (ref 0.7–4.0)
Lymphocytes Relative: 32 %
MCH: 28.7 pg (ref 26.0–34.0)
MCHC: 31.8 g/dL (ref 30.0–36.0)
MCV: 90.4 fL (ref 80.0–100.0)
MONOS PCT: 9 %
Monocytes Absolute: 0.4 10*3/uL (ref 0.1–1.0)
NEUTROS ABS: 2.8 10*3/uL (ref 1.7–7.7)
NEUTROS PCT: 57 %
Platelets: 274 10*3/uL (ref 150–400)
RBC: 4.46 MIL/uL (ref 3.87–5.11)
RDW: 12.8 % (ref 11.5–15.5)
WBC: 4.8 10*3/uL (ref 4.0–10.5)
nRBC: 0 % (ref 0.0–0.2)

## 2018-05-21 LAB — URINALYSIS, ROUTINE W REFLEX MICROSCOPIC
BILIRUBIN URINE: NEGATIVE
GLUCOSE, UA: NEGATIVE mg/dL
HGB URINE DIPSTICK: NEGATIVE
KETONES UR: 5 mg/dL — AB
Leukocytes, UA: NEGATIVE
Nitrite: NEGATIVE
PROTEIN: NEGATIVE mg/dL
Specific Gravity, Urine: 1.02 (ref 1.005–1.030)
pH: 5 (ref 5.0–8.0)

## 2018-05-21 LAB — I-STAT BETA HCG BLOOD, ED (MC, WL, AP ONLY)

## 2018-05-21 LAB — SALICYLATE LEVEL: Salicylate Lvl: <7 mg/dL (ref 2.8–30.0)

## 2018-05-21 LAB — ACETAMINOPHEN LEVEL: Acetaminophen (Tylenol), Serum: <10 ug/mL — ABNORMAL LOW (ref 10–30)

## 2018-05-21 LAB — ETHANOL

## 2018-05-21 MED ORDER — IBUPROFEN 400 MG PO TABS: 600.0000 mg | ORAL_TABLET | Freq: Four times a day (QID) | ORAL | Status: DC | PRN

## 2018-05-21 MED ORDER — LORAZEPAM 1 MG PO TABS: 1.0000 mg | ORAL_TABLET | ORAL | Status: DC | PRN

## 2018-05-21 NOTE — ED Notes (Signed)
Pt now reporting she is S. No plan just ideations.

## 2018-05-21 NOTE — Consult Note (Addendum)
[facility] Psych ED Discharge  05/21/2018 3:19 PM Patricia [name]  MRN:  [id number] Principal Problem: Depression Discharge Diagnoses:  Patient Active Problem List   Diagnosis Date Noted  . Depression [F32.9] 05/21/2018    Subjective: Pt was seen and chart reviewed with treatment team and Dr Sharma Covert. Pt denies suicidal/homicidal ideation, denies auditory/visual hallucinations and does not appear to be responding to internal stimuli. Pt stated she left her apartment because it has a bed bug infestation. She stated she went to stay with a friend and then had to leave there so she went to her brother's house but he was not at home so she came to the emergency room. Pt stated she told the emergency room staff she was here because she felt sick.  Pt stated she receives disability but has not gotten her check this month yet. Pt stated she told the management at her apt. building about the bed bugs but she did not say what the plan was in regards to taking care of the problem. Pt stated she sees a Veterinary surgeon and is taking Wellbutrin for her depression. She sated she has not had any money to get her medication since her check did not come yet. Pt will be referred to [facility] for medication management or to return to her PA for her medications. Pt could not recall the name of the clinic the PA works at but stated it is in [facility]. Pt stated she receives disability for her depression. Pt's UDS is negative and BAL is still pending. Pt denies drug and alcohol use. Pt is able to contract for safety upon discharge. Pt is psychiatrically clear for discharge.     Total Time spent with patient: 30 minutes  Past Psychiatric History: As above     Past Medical History: History reviewed. No pertinent past medical history. History reviewed. No pertinent surgical history. Family History: No family history on file. Family Psychiatric  History: Pt denies any family history Social History:  Social History    Substance and Sexual Activity  Alcohol Use No    Social History   Substance and Sexual Activity  Drug Use No   Social History   Socioeconomic History  . Marital status: Divorced    Spouse name: Not on file  . Number of children: Not on file  . Years of education: Not on file  . Highest education level: Not on file  Occupational History  . Not on file  Social Needs  . Financial resource strain: Not on file  . Food insecurity:    Worry: Not on file    Inability: Not on file  . Transportation needs:    Medical: Not on file    Non-medical: Not on file  Tobacco Use  . Smoking status: Never Smoker  . Smokeless tobacco: Never Used  Substance and Sexual Activity  . Alcohol use: No  . Drug use: No  . Sexual activity: Not on file  Lifestyle  . Physical activity:    Days per week: Not on file    Minutes per session: Not on file  . Stress: Not on file  Relationships  . Social connections:    Talks on phone: Not on file    Gets together: Not on file    Attends religious service: Not on file    Active member of club or organization: Not on file    Attends meetings of clubs or organizations: Not on file    Relationship status: Not on file  Other Topics Concern  . Not on file  Social History Narrative  . Not on file    Has this patient used any form of tobacco in the last 30 days? (Cigarettes, Smokeless Tobacco, Cigars, and/or Pipes) Prescription not provided because: Pt does not smoke  Current Medications: Current Facility-Administered Medications  Medication Dose Route Frequency Provider Last Rate Last Dose  . ibuprofen (ADVIL,MOTRIN) tablet 600 mg  600 mg Oral Q6H PRN Charlynne Pander, MD      . LORazepam (ATIVAN) tablet 1 mg  1 mg Oral Q4H PRN Charlynne Pander, MD       No current outpatient medications on file.   PTA Medications:  (Not in a hospital admission)  Musculoskeletal: Strength & Muscle Tone: within normal limits Gait & Station: normal Patient  leans: N/A  Psychiatric Specialty Exam: Physical Exam  Nursing note and vitals reviewed. Constitutional: She is oriented to person, place, and time. She appears well-developed.  Neurological: She is alert and oriented to person, place, and time.  Psychiatric: She exhibits a depressed mood.    Review of Systems  Psychiatric/Behavioral: Positive for depression.    Blood pressure 123/89, pulse (!) 111, temperature 97.9 F (36.6 C), temperature source Oral, resp. rate 18, height 5\' 3"  (1.6 m), weight 59 kg, last menstrual period 05/07/2018, SpO2 100 %.Body mass index is 23.03 kg/m.  General Appearance: Casual  Eye Contact:  Good  Speech:  Clear and Coherent and Normal Rate  Volume:  Normal  Mood:  Depressed  Affect:  Congruent and Depressed  Thought Process:  Coherent, Goal Directed and Linear  Orientation:  Full (Time, Place, and Person)  Thought Content:  Logical  Suicidal Thoughts:  No  Homicidal Thoughts:  No  Memory:  Immediate;   Good Recent;   Fair Remote;   Fair  Judgement:  Good  Insight:  Fair  Psychomotor Activity:  Normal  Concentration:  Concentration: Good and Attention Span: Good  Recall:  Good  Fund of Knowledge:  Good  Language:  Good  Akathisia:  No  Handed:  Right  AIMS (if indicated):     Assets:  Architect Housing Physical Health  ADL's:  Intact  Cognition:  WNL  Sleep:        Demographic Factors:  Low socioeconomic status, Living alone and Unemployed  Loss Factors: Financial problems/change in socioeconomic status  Historical Factors: NA  Risk Reduction Factors:   Sense of responsibility to family  Continued Clinical Symptoms:  Depression:   Impulsivity, Pt came to the emergency room when she had no place to stay for the night  Cognitive Features That Contribute To Risk:  Closed-mindedness    Suicide Risk:  Minimal: No identifiable suicidal ideation.  Patients presenting with no risk factors  but with morbid ruminations; may be classified as minimal risk based on the severity of the depressive symptoms    Plan Of Care/Follow-up recommendations:  Activity:  as tolerated Diet:  heart healthy  Disposition: Depression: Take all medications as prescribed by your outpatient provider. Keep all follow-up appointments as scheduled. Please schedule an appointment upon discharge to see your mental health porvider Do not consume alcohol or use illegal drugs while on prescription medications. Report any adverse effects from your medications to your primary care provider promptly.  In the event of recurrent symptoms or worsening symptoms, call 911, a crisis hotline, or go to the nearest emergency department for evaluation.   Laveda Abbe, NP 05/21/2018, 3:19 PM  Patient's chart reviewed and case discussed with the physician extender and developed treatment plan. Reviewed the information documented and agree with the treatment plan.  Juanetta Beets, DO 05/22/18 9:45 AM

## 2018-05-21 NOTE — ED Notes (Signed)
Pt refusing to speak to Dr. Baxter Hire Ward. Only requesting to speak with non-emergency room physicians.

## 2018-05-21 NOTE — ED Triage Notes (Signed)
The pt is c/o a headache general bosy aches chills coough  lmp 2 weeks ago

## 2018-05-21 NOTE — Discharge Instructions (Addendum)
Please follow up with your current outpatient mental health provider for medication management.  You may also find it helpful to follow up at [facility] if you are having trouble obtaining and paying for your medications:   [facility] [address] [place], [place] [address] [phone]

## 2018-05-21 NOTE — BH Assessment (Signed)
Tele Assessment Note   Patient Name: Patricia [name] MRN: [id number] Referring Physician: Adela Lank Location of Patient: [facility] Location of Provider: [facility]  Patricia [name] Patricia [name] is an 47 y.o., divorced female. Pt arrived to [facility] voluntarily and alone 1 hour post discharge from ED. Pt reports that she was discharged and was told to leave by security, so she waited for her ride that didn't come. Pt reports that she then decided to walk to the bus stop, but realized that she had no bus pass. Pt stated that she became dizzy and stressed, and decided that she wanted to shoot herself in the head. Pt reports that she has a hx of depression, but denies hx of suicide attempts. Pt denied AH/VH/HI. Pt denies SA. Pt reports that [facility]. Pt reports that she is prescribed Wellbutrin. Pt denied hx of trauma.   Pt reports that she lives alone, but reports that her apartment is full of bed bugs. Pt reports that all the units in her complex have bed bugs. Pt reports having some cousins that live in the area that are supportive. Pt reports receiving SSI. Pt reports no legal hx and denies being on probation.   Pt oriented to person, place, time and situation. Pt presented alert, dressed appropriately in scrubs and groomed. Pt spoke clearly, coherently and did not seem to be under the influence of any substances. Pt did not make good eye contact, but  answered questions appropriately. Pt presented anxious,fearful and somewhat open to the assessment process. Pt presented with no impairments of remote or recent memory.    Diagnosis: F32.2 Major Depressive Disorder Recurrent Severe without psychosis  Past Medical History:  Past Medical History:  Diagnosis Date  . Depression     History reviewed. No pertinent surgical history.  Family History: No family history on file.  Social History:  reports that she has never smoked. She has never  used smokeless tobacco. She reports that she drinks alcohol. She reports that she does not use drugs.  Additional Social History:  Alcohol / Drug Use Pain Medications: See MAR.  Prescriptions: Pt reports being prescribed Wellbutrin.  Over the Counter: See MAR. History of alcohol / drug use?: No history of alcohol / drug abuse  CIWA: CIWA-Ar BP: 131/89 Pulse Rate: 86 COWS:    Allergies:  Allergies  Allergen Reactions  . Latex Itching and Other (See Comments)    "Makes me itch and burn"    Home Medications:  (Not in a hospital admission)  OB/GYN Status:  Patient's last menstrual period was 05/07/2018.  General Assessment Data Location of Assessment: [facility] ED TTS Assessment: In system Is this a Tele or Face-to-Face Assessment?: Tele Assessment Is this an Initial Assessment or a Re-assessment for this encounter?: Initial Assessment Patient Accompanied by:: N/A Language Other than English: No Living Arrangements: Other (Comment)(Own Apartment) What gender do you identify as?: Female Marital status: Divorced [place] name: Patricia [name] Pregnancy Status: No Living Arrangements: Alone Can pt return to current living arrangement?: Yes Admission Status: Voluntary Is patient capable of signing voluntary admission?: Yes Referral Source: Self/Family/Friend Insurance type: HealthTeam  Medical Screening Exam [facility] Walk-in ONLY) Medical Exam completed: Yes  Crisis Care Plan Living Arrangements: Alone Legal Guardian: Other:(Self) Name of Psychiatrist: Pt [facility].  Name of Therapist: Misty [name]- [facility]  Education Status Is patient currently in school?: No Is the patient employed, unemployed or receiving disability?: Receiving disability income  Risk to self with the past 6 months Suicidal Ideation: Yes-Currently Present Has patient been a risk to self within the past 6 months prior to admission? : No Suicidal Intent: Yes-Currently  Present Has patient had any suicidal intent within the past 6 months prior to admission? : No Is patient at risk for suicide?: Yes Suicidal Plan?: Yes-Currently Present Has patient had any suicidal plan within the past 6 months prior to admission? : No Specify Current Suicidal Plan: Shoot self in the head.  Access to Means: No What has been your use of drugs/alcohol within the last 12 months?: Pt denied.  Previous Attempts/Gestures: No How many times?: 0 Other Self Harm Risks: Pt denied.  Triggers for Past Attempts: None known Intentional Self Injurious Behavior: None Family Suicide History: No Recent stressful life event(s): Other (Comment)(Pt reports home has bed bugs. ) Persecutory voices/beliefs?: No Depression: Yes Depression Symptoms: Despondent, Isolating, Loss of interest in usual pleasures, Feeling worthless/self pity Substance abuse history and/or treatment for substance abuse?: No Suicide prevention information given to non-admitted patients: Not applicable  Risk to Others within the past 6 months Homicidal Ideation: No Does patient have any lifetime risk of violence toward others beyond the six months prior to admission? : No Thoughts of Harm to Others: No Current Homicidal Intent: No Current Homicidal Plan: No Access to Homicidal Means: No Identified Victim: Denied.  History of harm to others?: No Assessment of Violence: None Noted Violent Behavior Description: N/A Does patient have access to weapons?: No Criminal Charges Pending?: No Does patient have a court date: No Is patient on probation?: No  Psychosis Hallucinations: None noted Delusions: None noted  Mental Status Report Appearance/Hygiene: Unremarkable Eye Contact: Poor Motor Activity: Unremarkable Speech: Logical/coherent Level of Consciousness: Alert Mood: Anxious Affect: Fearful Anxiety Level: Moderate Thought Processes: Coherent, Relevant Judgement: Impaired Orientation: Person, Place,  Time, Situation Obsessive Compulsive Thoughts/Behaviors: None  Cognitive Functioning Concentration: Normal Memory: Recent Intact, Remote Intact Is patient IDD: No Insight: Fair Impulse Control: Poor Appetite: Good Have you had any weight changes? : No Change Sleep: Decreased Total Hours of Sleep: 3 Vegetative Symptoms: None  ADLScreening [facility] Services) Patient's cognitive ability adequate to safely complete daily activities?: Yes Patient able to express need for assistance with ADLs?: Yes Independently performs ADLs?: Yes (appropriate for developmental age)  Prior Inpatient Therapy Prior Inpatient Therapy: Yes Prior Therapy Dates: 2017 Prior Therapy Facilty/Provider(s): [facility] Reason for Treatment: depression  Prior Outpatient Therapy Prior Outpatient Therapy: Yes Prior Therapy Dates: active Prior Therapy Facilty/Provider(s): [facility] Reason for Treatment: Depression Does patient have an ACCT team?: No Does patient have Intensive In-House Services?  : No Does patient have [facility] services? : No Does patient have [facility] services?: No  ADL Screening (condition at time of admission) Patient's cognitive ability adequate to safely complete daily activities?: Yes Is the patient deaf or have difficulty hearing?: No Does the patient have difficulty seeing, even when wearing glasses/contacts?: No Does the patient have difficulty concentrating, remembering, or making decisions?: No Patient able to express need for assistance with ADLs?: Yes Does the patient have difficulty dressing or bathing?: No Independently performs ADLs?: Yes (appropriate for developmental age) Does the patient have difficulty walking or climbing stairs?: No Weakness of Legs: None Weakness of Arms/Hands: None  Home Assistive Devices/Equipment Home Assistive Devices/Equipment: None  Therapy Consults (therapy consults require a physician order) PT Evaluation Needed: No OT  Evalulation Needed: No SLP Evaluation Needed: No Abuse/Neglect Assessment (Assessment to be complete while  patient is alone) Abuse/Neglect Assessment Can Be Completed: Yes Physical Abuse: Denies Verbal Abuse: Denies Sexual Abuse: Denies Exploitation of patient/patient's resources: Denies Self-Neglect: Denies Values / Beliefs Cultural Requests During Hospitalization: None Spiritual Requests During Hospitalization: None Consults Spiritual Care Consult Needed: No Social Work Consult Needed: No Merchant navy officer (For Healthcare) Does Patient Have a Medical Advance Directive?: No Would patient like information on creating a medical advance directive?: No - Patient declined          Disposition: Per Nira Conn, NP; Pt to be held for observation and stabilization due to SI and plan. Pt to be seen by psychiatry in the morning.  Disposition Initial Assessment Completed for this Encounter: Yes Patient referred to: Other (Comment)(N/A)  This service was provided via telemedicine using a 2-way, interactive audio and video technology.  Names of all persons participating in this telemedicine service and their role in this encounter. Name: Patricia [name] Role: Patient  Name: Chesley Noon Role: Clinician  Name:  Role:   Name:  Role:    Chesley Noon, M.S., [facility], LCAS Triage Specialist [facility] 05/21/2018 11:05 PM

## 2018-05-21 NOTE — ED Triage Notes (Signed)
Pt states that she has been having body aches and also states "I feel like shooting myself in the head" also states "I have been feeling really stressed and depressed".

## 2018-05-21 NOTE — ED Provider Notes (Signed)
TIME SEEN: 5:30 AM  CHIEF COMPLAINT: Flulike symptoms, suicidal thoughts  HPI: Patient is a 47 year old female with history of psychosis who presents to the emergency department with several days of flulike symptoms.  Reports chills, body aches, headache, nausea, cough with yellow sputum production.  No vomiting or diarrhea.  No known sick contacts or recent travel.  No neck pain or neck stiffness.  No rash.  Also states that she would like to see a psychiatrist.  Endorses suicidal thoughts without plan.  No HI or hallucinations.  ROS: See HPI Constitutional: no fever  Eyes: no drainage  ENT: no runny nose   Cardiovascular:  no chest pain  Resp: no SOB  GI: no vomiting GU: no dysuria Integumentary: no rash  Allergy: no hives  Musculoskeletal: no leg swelling  Neurological: no slurred speech ROS otherwise negative  PAST MEDICAL HISTORY/PAST SURGICAL HISTORY:  History reviewed. No pertinent past medical history.  MEDICATIONS:  Prior to Admission medications   Medication Sig Start Date End Date Taking? Authorizing Provider  ferrous sulfate 325 (65 FE) MG tablet Take 325 mg by mouth daily with breakfast.    [provider]    ALLERGIES:  No Known Allergies  SOCIAL HISTORY:  Social History   Tobacco Use  . Smoking status: Never Smoker  . Smokeless tobacco: Never Used  Substance Use Topics  . Alcohol use: No    FAMILY HISTORY: No family history on file.  EXAM: BP 128/85 (BP Location: Right Arm)   Pulse (!) 106   Temp 98.1 F (36.7 C) (Oral)   Resp 16   Ht 5\' 3"  (1.6 m)   Wt 59 kg   LMP 05/07/2018   SpO2 100%   BMI 23.03 kg/m  CONSTITUTIONAL: Alert and oriented and responds appropriately to questions. Well-appearing; well-nourished HEAD: Normocephalic EYES: Conjunctivae clear, pupils appear equal, EOMI ENT: normal nose; moist mucous membranes NECK: Supple, no meningismus, no nuchal rigidity, no LAD  CARD: RRR; S1 and S2 appreciated; no murmurs, no  clicks, no rubs, no gallops RESP: Normal chest excursion without splinting or tachypnea; breath sounds clear and equal bilaterally; no wheezes, no rhonchi, no rales, no hypoxia or respiratory distress, speaking full sentences ABD/GI: Normal bowel sounds; non-distended; soft, non-tender, no rebound, no guarding, no peritoneal signs, no hepatosplenomegaly BACK:  The back appears normal and is non-tender to palpation, there is no CVA tenderness EXT: Normal ROM in all joints; non-tender to palpation; no edema; normal capillary refill; no cyanosis, no calf tenderness or swelling    SKIN: Normal color for age and race; warm; no rash NEURO: Moves all extremities equally PSYCH: Doris is suicidal thoughts without plan.  No HI or hallucinations.   MEDICAL DECISION MAKING: Patient here with flulike symptoms.  Outside treatment window for Tamiflu.  Well-appearing on exam.  Lungs are clear and she is afebrile today.  Doubt pneumonia.  Doubt bacteremia, sepsis.  Will obtain labs, urine.  Will consult TTS for further evaluation.  ED PROGRESS: Pt's labs are unremarkable.  Awaiting TTS evaluation for further disposition.  She is here voluntarily.   I reviewed all nursing notes, vitals, pertinent previous records, EKGs, lab and urine results, imaging (as available).      Anylah Scheib, Layla Maw, DO 05/21/18 [phone]

## 2018-05-21 NOTE — ED Notes (Signed)
Pt was given all her belongings and her valuables from security at this time.

## 2018-05-21 NOTE — ED Notes (Signed)
ED Provider at bedside. 

## 2018-05-21 NOTE — ED Notes (Signed)
Pt is getting dressed and going to call for a ride. Told pt that we could also give her a bus pass if need be.

## 2018-05-21 NOTE — ED Notes (Signed)
Regular Diet was ordered for lunch. 

## 2018-05-21 NOTE — BH Assessment (Signed)
Tele Assessment Note   Patient Name: Patricia [name] MRN: [id number] Referring Physician: Baxter [name] Ward Location of Patient: [facility] Location of Provider: Behavioral Health TTS Department  Patricia [name] is an 47 y.o. female who presented at [facility] with medical complaints, but when is was time for her to be discharged, she indicated that she was having suicidal thought, but no plan. Patient states that she has a history of mental illness and states that she is seen at [facility], but states that she has not been seen in two months and states that she has not been on her medications in two mkonths because she states that she could not afford them.  Patient states that she has never had any suicide attempts in the past.  Patient denies HI and Psychosis and denies any substance use.  Patient states that she is currently stressed out because she has bed bugs in her apartment and states that she had to leave it.  Patient states that she was going to go stay with a friend, but on the way there she decided to come to the ED.  TTS asked her if her friend would let her stay with her and patient states, "I think so."  Patient states, "I guess I could go back to my apartment if I had to, but I really do not want to."  Patient states that she is divorced and lives alone.  She states that she has an 45 year old daughter.  Patient states that is on disability for her mental health issues.  Patient presents as alert and oriented.  She states that her mood is depressed, but patient did not appear to be as severely depressed as she claims.  Patient's affect is a little flat.  Her memory is intact and her thought processes are organized.  Patient's insight, judgment and impulse control are fair.  Patient does not appear to be responding to any internal stimuli.  Patient's speech is clear and coherent and her eye contact is fair.   Diagnosis: F32.2 Major Depressive Disorder Recurrent  Severe without psychosis  Past Medical History: History reviewed. No pertinent past medical history.  History reviewed. No pertinent surgical history.  Family History: No family history on file.  Social History:  reports that she has never smoked. She has never used smokeless tobacco. She reports that she does not drink alcohol or use drugs.  Additional Social History:  Alcohol / Drug Use Pain Medications: see MAR Prescriptions: see MAR Over the Counter: see MAR History of alcohol / drug use?: No history of alcohol / drug abuse Longest period of sobriety (when/how long): N/A  CIWA: CIWA-Ar BP: 128/85 Pulse Rate: (!) 106 COWS:    Allergies: No Known Allergies  Home Medications:  (Not in a hospital admission)  OB/GYN Status:  Patient's last menstrual period was 05/07/2018.  General Assessment Data Location of Assessment: [facility] ED TTS Assessment: In system Is this a Tele or Face-to-Face Assessment?: Face-to-Face Is this an Initial Assessment or a Re-assessment for this encounter?: Initial Assessment Patient Accompanied by:: N/A Language Other than English: No Living Arrangements: Other (Comment)(lives on her own in an apartment) What gender do you identify as?: Female Marital status: Divorced Patricia [name] name: Warden/ranger) Pregnancy Status: No Living Arrangements: Alone Can pt return to current living arrangement?: Yes Admission Status: Voluntary Is patient capable of signing voluntary admission?: No Referral Source: Self/Family/Friend Insurance type: (Medicare)     Crisis Care Plan Living Arrangements: Alone Legal Guardian: Other:(self) Name of  Psychiatrist: ([facility]) Name of Therapist: (none)  Education Status Is patient currently in school?: No Is the patient employed, unemployed or receiving disability?: Receiving disability income  Risk to self with the past 6 months Suicidal Ideation: Yes-Currently Present Has patient been a risk to self within  the past 6 months prior to admission? : No Suicidal Intent: No Has patient had any suicidal intent within the past 6 months prior to admission? : No Is patient at risk for suicide?: Yes(due to hx of mental illness) Suicidal Plan?: No Has patient had any suicidal plan within the past 6 months prior to admission? : No Access to Means: No What has been your use of drugs/alcohol within the last 12 months?: (none) Previous Attempts/Gestures: No How many times?: 0 Other Self Harm Risks: (none) Triggers for Past Attempts: None known Intentional Self Injurious Behavior: None Family Suicide History: No Recent stressful life event(s): (apartment has bed bugs) Persecutory voices/beliefs?: No Depression: Yes Depression Symptoms: Despondent, Insomnia, Isolating, Loss of interest in usual pleasures Substance abuse history and/or treatment for substance abuse?: No Suicide prevention information given to non-admitted patients: Yes  Risk to Others within the past 6 months Homicidal Ideation: No Does patient have any lifetime risk of violence toward others beyond the six months prior to admission? : No Thoughts of Harm to Others: No Current Homicidal Intent: No Current Homicidal Plan: No Access to Homicidal Means: No Identified Victim: none History of harm to others?: No Assessment of Violence: None Noted Violent Behavior Description: none Does patient have access to weapons?: No Criminal Charges Pending?: No Does patient have a court date: No Is patient on probation?: No  Psychosis Hallucinations: None noted Delusions: None noted  Mental Status Report Appearance/Hygiene: Unremarkable Eye Contact: Fair Motor Activity: Unremarkable Speech: Logical/coherent Level of Consciousness: Alert Mood: Depressed Affect: Flat Anxiety Level: Moderate Thought Processes: Coherent, Relevant Judgement: Impaired Orientation: Person, Place, Time, Situation Obsessive Compulsive Thoughts/Behaviors:  None  Cognitive Functioning Concentration: Normal Memory: Recent Intact, Remote Intact Is patient IDD: No Insight: Fair Impulse Control: Fair Appetite: Good Have you had any weight changes? : No Change Sleep: Decreased Total Hours of Sleep: 3 Vegetative Symptoms: None  ADLScreening [facility] Assessment Services) Patient's cognitive ability adequate to safely complete daily activities?: Yes Patient able to express need for assistance with ADLs?: Yes Independently performs ADLs?: Yes (appropriate for developmental age)  Prior Inpatient Therapy Prior Inpatient Therapy: Yes Prior Therapy Dates: (2 yeras ago) Prior Therapy Facilty/Provider(s): ([facility] in [place]) Reason for Treatment: depression  Prior Outpatient Therapy Prior Outpatient Therapy: Yes Prior Therapy Dates: active Prior Therapy Facilty/Provider(s): [facility] Reason for Treatment: (depression) Does patient have an ACCT team?: No Does patient have Intensive In-House Services?  : No Does patient have [facility] services? : No Does patient have P4CC services?: No  ADL Screening (condition at time of admission) Patient's cognitive ability adequate to safely complete daily activities?: Yes Is the patient deaf or have difficulty hearing?: No Does the patient have difficulty seeing, even when wearing glasses/contacts?: No Does the patient have difficulty concentrating, remembering, or making decisions?: No Patient able to express need for assistance with ADLs?: Yes Does the patient have difficulty dressing or bathing?: No Independently performs ADLs?: Yes (appropriate for developmental age) Does the patient have difficulty walking or climbing stairs?: No Weakness of Legs: None Weakness of Arms/Hands: None  Home Assistive Devices/Equipment Home Assistive Devices/Equipment: None  Therapy Consults (therapy consults require a physician order) PT Evaluation Needed: No OT Evalulation Needed: No  SLP  Evaluation Needed: No Abuse/Neglect Assessment (Assessment to be complete while patient is alone) Abuse/Neglect Assessment Can Be Completed: Yes Physical Abuse: Denies Verbal Abuse: Denies Sexual Abuse: Denies Exploitation of patient/patient's resources: Denies Self-Neglect: Denies Values / Beliefs Cultural Requests During Hospitalization: None Spiritual Requests During Hospitalization: None Consults Spiritual Care Consult Needed: No Social Work Consult Needed: No Merchant navy officer (For Healthcare) Does Patient Have a Medical Advance Directive?: No Would patient like information on creating a medical advance directive?: No - Patient declined Nutrition Screen- [facility] Adult/[facility]/AP Has the patient recently lost weight without trying?: No Has the patient been eating poorly because of a decreased appetite?: No Malnutrition Screening Tool Score: 0        Disposition: NP Elta Guadeloupe recommends the pt be discharged with outpatient resources  Disposition Initial Assessment Completed for this Encounter: Yes Patient referred to: Other (Comment)([facility])  This service was provided via telemedicine using a 2-way, interactive audio and video technology.  Names of all persons participating in this telemedicine service and their role in this encounter. Name: Kewanna [name] Role: patient  Name: Theora Vankirk Role: TTS  Name:  Role:   Name:  Role:     Daphene Calamity 05/21/2018 8:37 AM

## 2018-05-21 NOTE — ED Notes (Signed)
Pt was given choice of bus pass or cab voucher to her address and pt told this RN that she was going to [place] and was waiting on her ride in the lobby and did not need either one.

## 2018-05-21 NOTE — ED Provider Notes (Signed)
[facility] EMERGENCY DEPARTMENT Provider Note   CSN: [id number] Arrival date & time: 05/21/18  1839     History   Chief Complaint Chief Complaint  Patient presents with  . Suicidal    HPI Patricia [name] is a 47 y.o. female with a PMH significant for psychosis and depression who presents with suicidal ideation.  She had suicidal ideation earlier today and was seen in the emergency department and cleared by tele-psych, but she returned to the emergency department because she said that she now has a plan, which would be "to shoot herself in the head."  She says she has been very stressed out lately, and discusses how she has been confused about whether or not she has a bus pass and how she catch her ride from different places.  She continues to live in the same apartment and feels safe there.  She says that she does not own a gun but she "knows people who do."  She says she has never tried to take her own life before, but she has had suicidal ideation frequently.    Past Medical History:  Diagnosis Date  . Depression     Patient Active Problem List   Diagnosis Date Noted  . Depression 05/21/2018    History reviewed. No pertinent surgical history.   OB History   None      Home Medications    Prior to Admission medications   Not on File    Family History No family history on file.  Social History Social History   Tobacco Use  . Smoking status: Never Smoker  . Smokeless tobacco: Never Used  Substance Use Topics  . Alcohol use: Yes    Comment: social   . Drug use: No     Allergies   Patient has no known allergies.   Review of Systems Review of Systems  Constitutional: Negative for activity change, appetite change and fever.  HENT: Negative for congestion.   Respiratory: Negative for cough.   Cardiovascular: Negative for chest pain.  Gastrointestinal: Negative for abdominal pain.  Genitourinary: Negative for difficulty  urinating.  Musculoskeletal: Negative for joint swelling.  Skin: Negative for rash.  Neurological: Negative for dizziness.  Psychiatric/Behavioral: Positive for confusion and suicidal ideas.     Physical Exam Updated Vital Signs BP 131/89 (BP Location: Right Arm)   Pulse 86   Temp 99.1 F (37.3 C) (Oral)   Resp 17   Ht 5\' 3"  (1.6 m)   Wt 59 kg   LMP 05/07/2018   SpO2 99%   BMI 23.04 kg/m   Physical Exam  Constitutional: She appears well-developed and well-nourished. No distress.  Rocking back and forth  HENT:  Head: Normocephalic and atraumatic.  Right Ear: External ear normal.  Left Ear: External ear normal.  Eyes: Conjunctivae and EOM are normal.  Neck: Normal range of motion.  Cardiovascular: Normal rate, regular rhythm and normal heart sounds.  Pulmonary/Chest: Effort normal and breath sounds normal. No respiratory distress.  Abdominal: Soft. Bowel sounds are normal.  Musculoskeletal: Normal range of motion. She exhibits no deformity.  Neurological: She is alert. No cranial nerve deficit.  Psychiatric: Her speech is normal. Her mood appears anxious. Cognition and memory are impaired. She expresses suicidal ideation. She expresses no homicidal ideation. She expresses suicidal plans.     ED Treatments / Results  Labs (all labs ordered are listed, but only abnormal results are displayed) Labs Reviewed  URINALYSIS, ROUTINE W REFLEX MICROSCOPIC -  Abnormal; Notable for the following components:      Result Value   Ketones, ur 5 (*)    All other components within normal limits    EKG None  Radiology No results found.  Procedures Procedures (including critical care time)  Medications Ordered in ED Medications - No data to display   Initial Impression / Assessment and Plan / ED Course  I have reviewed the triage vital signs and the nursing notes.  Pertinent labs & imaging results that were available during my care of the patient were reviewed by me and  considered in my medical decision making (see chart for details).     Due to patient's new suicidal plan and possible access to guns, we will consult telemetry psych for another evaluation.  Final Clinical Impressions(s) / ED Diagnoses   Final diagnoses:  Suicidal ideation    ED Discharge Orders    None       Lennox Solders, MD 05/21/18 2243    Melene Plan, DO 05/22/18 0001

## 2018-05-21 NOTE — ED Provider Notes (Signed)
Patient placed in Quick Look pathway, seen and evaluated   Chief Complaint: Suicidal  HPI:   Patient with history of psychosis presents after being discharged around 1 hour ago with suicidal ideations.  Patient reports she wants to shoot herself in the head.  She has been feeling really stressed and depressed.  She spoke with TTS this morning and reports she told them she did not know if she was suicidal, however she now she reports she wants to shoot herself in the head.  She denies any HI or AVH.  Patient was evaluated less than 24 hours and labs were conducted.  She reports a 3-day history of cough.  She also reports noticing a yellow vaginal discharge that began yesterday.  She is not sexually active.  She reports having this vaginal discharge in the past.  ROS: Suicidal ideations  Physical Exam:   Gen: No distress  Neuro: Awake and Alert  Skin: Warm    Focused Exam: Heart normal rate and rhythm, lungs clear to auscultation, abdominal exam nontender, patient actively endorsing suicidal ideations with plan.   Initiation of care has begun. The patient has been counseled on the process, plan, and necessity for staying for the completion/evaluation, and the remainder of the medical screening examination    Emi Holes, PA-C 05/21/18 Trevor Iha, MD 05/23/18 1555

## 2018-05-21 NOTE — ED Notes (Signed)
Belongings placed in locker 6.  

## 2018-05-21 NOTE — ED Notes (Signed)
Pt reports nausea, abd pain, body aches, headache, and generalized weakness for the past 2 days. Pt told triage this was going on for the last 2 weeks but now reports 2 days once in the back in the room. Pt reports her pain is a 7.

## 2018-05-21 NOTE — ED Notes (Signed)
Ordered breakfast tray for pt  

## 2018-05-22 ENCOUNTER — Other Ambulatory Visit: Payer: Self-pay

## 2018-05-22 ENCOUNTER — Encounter (HOSPITAL_COMMUNITY): Payer: Self-pay

## 2018-05-22 ENCOUNTER — Inpatient Hospital Stay (HOSPITAL_COMMUNITY)
Admission: AD | Admit: 2018-05-22 | Discharge: 2018-06-16 | DRG: 885 | Disposition: A | Discharge status: Home / Self Care | Payer: PPO | Source: Intra-hospital | Attending: Psychiatry | Admitting: Psychiatry

## 2018-05-22 ENCOUNTER — Encounter (HOSPITAL_COMMUNITY): Payer: Self-pay | Admitting: Registered Nurse

## 2018-05-22 DIAGNOSIS — F4310 Post-traumatic stress disorder, unspecified: Secondary | ICD-10-CM

## 2018-05-22 DIAGNOSIS — F419 Anxiety disorder, unspecified: Secondary | ICD-10-CM | POA: Diagnosis not present

## 2018-05-22 DIAGNOSIS — R197 Diarrhea, unspecified: Secondary | ICD-10-CM | POA: Diagnosis not present

## 2018-05-22 DIAGNOSIS — R45851 Suicidal ideations: Secondary | ICD-10-CM | POA: Diagnosis present

## 2018-05-22 DIAGNOSIS — G4700 Insomnia, unspecified: Secondary | ICD-10-CM | POA: Diagnosis not present

## 2018-05-22 DIAGNOSIS — F332 Major depressive disorder, recurrent severe without psychotic features: Secondary | ICD-10-CM | POA: Diagnosis not present

## 2018-05-22 DIAGNOSIS — F200 Paranoid schizophrenia: Secondary | ICD-10-CM

## 2018-05-22 DIAGNOSIS — R451 Restlessness and agitation: Secondary | ICD-10-CM | POA: Diagnosis not present

## 2018-05-22 DIAGNOSIS — R110 Nausea: Secondary | ICD-10-CM | POA: Diagnosis not present

## 2018-05-22 DIAGNOSIS — F250 Schizoaffective disorder, bipolar type: Secondary | ICD-10-CM | POA: Diagnosis not present

## 2018-05-22 DIAGNOSIS — Z91040 Latex allergy status: Secondary | ICD-10-CM | POA: Diagnosis not present

## 2018-05-22 DIAGNOSIS — Z23 Encounter for immunization: Secondary | ICD-10-CM

## 2018-05-22 LAB — URINALYSIS, COMPLETE (UACMP) WITH MICROSCOPIC
Bilirubin Urine: NEGATIVE
Glucose, UA: NEGATIVE mg/dL
Hgb urine dipstick: NEGATIVE
KETONES UR: NEGATIVE mg/dL
LEUKOCYTES UA: NEGATIVE
Nitrite: NEGATIVE
PROTEIN: NEGATIVE mg/dL
Specific Gravity, Urine: 1.013 (ref 1.005–1.030)
pH: 6 (ref 5.0–8.0)

## 2018-05-22 LAB — RAPID URINE DRUG SCREEN, HOSP PERFORMED
Amphetamines: NOT DETECTED
Barbiturates: NOT DETECTED
Benzodiazepines: NOT DETECTED
COCAINE: NOT DETECTED
Opiates: NOT DETECTED
Tetrahydrocannabinol: NOT DETECTED

## 2018-05-22 MED ORDER — MAGNESIUM HYDROXIDE 400 MG/5ML PO SUSP: 15.0000 mL | Freq: Every evening | ORAL | Status: DC | PRN

## 2018-05-22 MED ORDER — TRAZODONE HCL 50 MG PO TABS
50.0000 mg | ORAL_TABLET | Freq: Every evening | ORAL | Status: DC | PRN
  Filled 2018-05-22: qty 1

## 2018-05-22 MED ORDER — HYDROXYZINE HCL 25 MG PO TABS
25.0000 mg | ORAL_TABLET | Freq: Four times a day (QID) | ORAL | Status: DC | PRN
  Filled 2018-05-22 (×2): qty 1

## 2018-05-22 MED ORDER — FAMOTIDINE 20 MG PO TABS
20.0000 mg | ORAL_TABLET | Freq: Every day | ORAL | Status: DC
  Administered 2018-05-22 – 2018-06-16 (×26): 20 mg via ORAL
  Filled 2018-05-22 (×28): qty 1

## 2018-05-22 MED ORDER — PALIPERIDONE ER 3 MG PO TB24
3.0000 mg | ORAL_TABLET | Freq: Every day | ORAL | Status: DC
  Filled 2018-05-22 (×2): qty 1

## 2018-05-22 MED ORDER — LORAZEPAM 2 MG/ML IJ SOLN: 2.0000 mg | INTRAMUSCULAR | Status: DC | PRN

## 2018-05-22 MED ORDER — HALOPERIDOL LACTATE 5 MG/ML IJ SOLN: 2.0000 mg | Freq: Four times a day (QID) | INTRAMUSCULAR | Status: DC | PRN

## 2018-05-22 MED ORDER — ACETAMINOPHEN 325 MG PO TABS
650.0000 mg | ORAL_TABLET | Freq: Four times a day (QID) | ORAL | Status: DC | PRN
  Administered 2018-05-24: 650 mg via ORAL
  Administered 2018-05-25: 325 mg via ORAL
  Administered 2018-05-26 – 2018-06-13 (×4): 650 mg via ORAL
  Filled 2018-05-22 (×6): qty 2

## 2018-05-22 MED ORDER — PALIPERIDONE ER 3 MG PO TB24
3.0000 mg | ORAL_TABLET | Freq: Once | ORAL | Status: AC
  Administered 2018-05-22: 3 mg via ORAL
  Filled 2018-05-22: qty 1

## 2018-05-22 MED ORDER — LORAZEPAM 1 MG PO TABS: 1.0000 mg | ORAL_TABLET | ORAL | Status: DC | PRN

## 2018-05-22 NOTE — Plan of Care (Signed)
  Problem: Education: Goal: Emotional status will improve 05/22/2018 1500 by Dewayne Shorter, RN Outcome: Not Progressing 05/22/2018 1458 by Dewayne Shorter, RN Outcome: Not Progressing   Problem: Education: Goal: Mental status will improve 05/22/2018 1500 by Dewayne Shorter, RN Outcome: Not Progressing 05/22/2018 1458 by Dewayne Shorter, RN Outcome: Not Progressing   Problem: Education: Goal: Verbalization of understanding the information provided will improve 05/22/2018 1500 by Dewayne Shorter, RN Outcome: Not Progressing 05/22/2018 1458 by Dewayne Shorter, RN Outcome: Not Progressing

## 2018-05-22 NOTE — Consult Note (Signed)
Telepsych Consultation   Reason for Consult:  Depression Referring Physician:  Melene Plan, DO Location of Patient: [facility] Location of Provider: [facility]  Patient Identification: Patricia [name] MRN:  [id number] Principal Diagnosis: MDD (major depressive disorder), recurrent severe, without psychosis (HCC) Diagnosis:   Patient Active Problem List   Diagnosis Date Noted  . MDD (major depressive disorder), recurrent severe, without psychosis (HCC) [F33.2] 05/22/2018  . Depression [F32.9] 05/21/2018    Total Time spent with patient: 30 minutes  Subjective:   Patricia [name] is a 47 y.o. female patient presented to [facility] one hour post discharge from previous ED visit for medical concerns with complaints that her ride did not show up and she started to feel dizzy and stressed while walking to bus stop; then wanted to shoot herself.  Complaints that she has bed bugs in her home and did not want to return there.  HPI:  Patricia [name], 47 y.o., female patient seen via telepsych by this provider; chart reviewed and consulted with Dr. Lucianne Muss on 05/22/18.  On evaluation Patricia [name] reports "When I left yesterday and was at the bus stop; I just starting feeling really depressed and stressed and wanted to shoot myself."  Patient states that she does not have a gun "but I know plenty of people with guns."  Patient continues to endorse suicidal ideation with plan to shoot self.  Patient asked if the people she knew would let her have a gun that belong to them "I think so; but I don't know for sure."  Patient states that her stressors are "So many things.  My apartment has bed bugs.  I can't get my disability.  I can't see my kids.  It's a lot of things."  Patient states that she has a child that is 34 yr old; when asked where her child was she responded "I don't know."  Patient states that she lives alone.  Patient denies homicidal ideation, psychosis, and  paranoia.  [facility]  During evaluation Patricia [name] is alert/oriented x 4; calm/cooperative; and mood congruent with affect.  She does not appear to be responding to internal/external stimuli or delusional thoughts.  Patient denies harm/homicidal ideation, psychosis, and paranoia; but continues to make vague complaint of suicidal ideation with plan to shoot self in head.  Patient unable to contract for safety; or establish a safety plan; stating "I still feel the same; I said I was going to shoot myself in the head."  Patient answered question appropriately.    Past Psychiatric History: Depression; outpatient services at [facility]  Risk to Self: Suicidal Ideation: Yes-Currently Present Suicidal Intent: Yes-Currently Present Is patient at risk for suicide?: Yes Suicidal Plan?: Yes-Currently Present Specify Current Suicidal Plan: Shoot self in the head.  Access to Means: No What has been your use of drugs/alcohol within the last 12 months?: Pt denied.  How many times?: 0 Other Self Harm Risks: Pt denied.  Triggers for Past Attempts: None known Intentional Self Injurious Behavior: None Risk to Others: Homicidal Ideation: No Thoughts of Harm to Others: No Current Homicidal Intent: No Current Homicidal Plan: No Access to Homicidal Means: No Identified Victim: Denied.  History of harm to others?: No Assessment of Violence: None Noted Violent Behavior Description: N/A Does patient have access to weapons?: No Criminal Charges Pending?: No Does patient have a court date: No Prior Inpatient Therapy: Prior Inpatient Therapy: Yes Prior Therapy Dates:  2017 Prior Therapy Facilty/Provider(s): [facility] Reason for Treatment: depression Prior Outpatient Therapy: Prior Outpatient Therapy: Yes Prior Therapy Dates: active Prior Therapy Facilty[facility]Provider[facility]): [facility] Reason for  Treatment: Depression Does patient have an ACCT team?: No Does patient have Intensive In-House Services?  : No Does patient have [facility] services? : No Does patient have P4CC services?: No  Past Medical History:  Past Medical History:  Diagnosis Date  . Depression    History reviewed. No pertinent surgical history. Family History: History reviewed. No pertinent family history. Family Psychiatric  History: Unaware Social History:  Social History   Substance and Sexual Activity  Alcohol Use Yes   Comment: social      Social History   Substance and Sexual Activity  Drug Use No    Social History   Socioeconomic History  . Marital status: Divorced    Spouse name: Not on file  . Number of children: Not on file  . Years of education: Not on file  . Highest education level: Not on file  Occupational History  . Not on file  Social Needs  . Financial resource strain: Not on file  . Food insecurity:    Worry: Not on file    Inability: Not on file  . Transportation needs:    Medical: Not on file    Non-medical: Not on file  Tobacco Use  . Smoking status: Never Smoker  . Smokeless tobacco: Never Used  Substance and Sexual Activity  . Alcohol use: Yes    Comment: social   . Drug use: No  . Sexual activity: Not on file  Lifestyle  . Physical activity:    Days per week: Not on file    Minutes per session: Not on file  . Stress: Not on file  Relationships  . Social connections:    Talks on phone: Not on file    Gets together: Not on file    Attends religious service: Not on file    Active member of club or organization: Not on file    Attends meetings of clubs or organizations: Not on file    Relationship status: Not on file  Other Topics Concern  . Not on file  Social History Narrative  . Not on file   Additional Social History:    Allergies:   Allergies  Allergen Reactions  . Latex Itching and Other (See Comments)    "Makes me itch and burn"    Labs:   Results for orders placed or performed during the hospital encounter of 05/21/18 (from the past 48 hour(s))  Urinalysis, Routine w reflex microscopic- may I&O cath if menses     Status: Abnormal   Collection Time: 05/21/18  9:01 PM  Result Value Ref Range   Color, Urine YELLOW YELLOW   APPearance CLEAR CLEAR   Specific Gravity, Urine 1.020 1.005 - 1.030   pH 5.0 5.0 - 8.0   Glucose, UA NEGATIVE NEGATIVE mg/dL   Hgb urine dipstick NEGATIVE NEGATIVE   Bilirubin Urine NEGATIVE NEGATIVE   Ketones, ur 5 (A) NEGATIVE mg/dL   Protein, ur NEGATIVE NEGATIVE mg/dL   Nitrite NEGATIVE NEGATIVE   Leukocytes, UA NEGATIVE NEGATIVE    Comment: Performed at [facility], [address] [place], [place] [address]    Medications:  No current facility-administered medications for this encounter.    Current Outpatient Medications  Medication Sig Dispense Refill  . acetaminophen (TYLENOL) 325 MG tablet Take 325-650 mg by mouth every 6 (  six) hours as needed for mild pain, moderate pain or headache.    Marland Kitchen aspirin-acetaminophen-caffeine (EXCEDRIN MIGRAINE) 250-250-65 MG tablet Take 1 tablet by mouth every 6 (six) hours as needed for headache or migraine.      Musculoskeletal: Strength & Muscle Tone: within normal limits Gait & Station: normal Patient leans: N/A  Psychiatric Specialty Exam: Physical Exam  ROS  Blood pressure 111/69, pulse 80, temperature 98.6 F (37 C), temperature source Oral, resp. rate 16, height 5\' 3"  (1.6 m), weight 59 kg, last menstrual period 05/07/2018, SpO2 99 %.Body mass index is 23.04 kg/m.  General Appearance: Casual  Eye Contact:  Fair  Speech:  Clear and Coherent  Volume:  Normal  Mood:  Depressed  Affect:  Congruent and Depressed  Thought Process:  Coherent  Orientation:  Full (Time, Place, and Person)  Thought Content:  Logical  Suicidal Thoughts:  Yes.  with intent/plan  Homicidal Thoughts:  No  Memory:  Immediate;   Fair Recent;   Fair Remote;    Fair  Judgement:  Fair  Insight:  Lacking  Psychomotor Activity:  Normal  Concentration:  Concentration: Fair and Attention Span: Fair  Recall:  [employer] of Knowledge:  Fair  Language:  Good  Akathisia:  No  Handed:  Right  AIMS (if indicated):     Assets:  Communication Skills Housing  ADL's:  Intact  Cognition:  WNL  Sleep:        Treatment Plan Summary: Daily contact with patient to assess and evaluate symptoms and progress in treatment, Medication management and Plan Inpatient psychiatric treatment  Disposition: Recommend psychiatric Inpatient admission when medically cleared.  This service was provided via telemedicine using a 2-way, interactive audio and video technology.  Names of all persons participating in this telemedicine service and their role in this encounter. Name: Assunta Found, NP  Role: Tele psych Assessment  Name: Dr. Lucianne Muss Role: Psychiatrist  Name: Lawerance Cruel Role: Patient  Name:  Role:    Assunta Found, NP 05/22/2018 10:48 AM

## 2018-05-22 NOTE — Progress Notes (Signed)
Nursing Progress Note: 7p-7a D: Pt currently presents with a childlike/silly/hyperactive/concrete affect and behavior. Pt states "I don't need meds, sweetie. I am fine. Doctors don't know what they are talking about." Interacting minimally with the milieu. Pt reports good sleep during the previous night with current medication regimen.  A: Pt refused medications per providers orders. Pt's labs and vitals were monitored throughout the night. Pt supported emotionally and encouraged to express concerns and questions. Pt educated on medications.  R: Pt's safety ensured with 15 minute and environmental checks. Pt currently denies SI, HI, and AVH. Pt verbally contracts to seek staff if SI,HI, or AVH occurs and to consult with staff before acting on any harmful thoughts. Will continue to monitor.

## 2018-05-22 NOTE — Progress Notes (Signed)
Patient presented to [facility] after going to [facility] ED due to SI. Patient has a plan to shoot herself in the head with a gun, and says she can find access to guns. Patient still endorses SI, but verbally contracts for safety. Patient stated her stressors include the fact that she can't move out of her apartment that apparently has bed bugs, and she doesn't have a good relationship with her kid(s). Patient mumbled something about not knowing where her kids are, but according to prior assessments, she only has one 49 year old. Patient said "I don't know where my kids are... I only got to keep one." Denies HI AVH. Denies alcohol and drug use. Patient claims to have completed some college courses for psychology.  Patient was oriented to the unit. Skin assessment was performed with AJ, MHT and was found unremarkable. Safety is maintained with 15 minute checks. Will continue to monitor.

## 2018-05-22 NOTE — Tx Team (Signed)
Initial Treatment Plan 05/22/2018 2:51 PM Patricia [name] [id number]    PATIENT STRESSORS: Financial difficulties Marital or family conflict Occupational concerns   PATIENT STRENGTHS: Capable of independent living Motivation for treatment/growth Supportive family/friends   PATIENT IDENTIFIED PROBLEMS: Depression  Suicidal Ideation                   DISCHARGE CRITERIA:  Ability to meet basic life and health needs Improved stabilization in mood, thinking, and/or behavior Verbal commitment to aftercare and medication compliance  PRELIMINARY DISCHARGE PLAN: Outpatient therapy Return to previous living arrangement  PATIENT/FAMILY INVOLVEMENT: This treatment plan has been presented to and reviewed with the patient, Patricia [name].  The patient and family have been given the opportunity to ask questions and make suggestions.  Dewayne Shorter, RN 05/22/2018, 2:51 PM

## 2018-05-22 NOTE — Progress Notes (Signed)
Nurse, Marylene Land, informed of disposition.

## 2018-05-22 NOTE — ED Notes (Signed)
Pt transferred to [facility] 
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
## 2018-05-22 NOTE — Plan of Care (Signed)
  Problem: Education: Goal: Verbalization of understanding the information provided will improve Outcome: Not Progressing   Problem: Activity: Goal: Interest or engagement in activities will improve Outcome: Not Progressing   Problem: Activity: Goal: Sleeping patterns will improve Outcome: Not Progressing   

## 2018-05-22 NOTE — ED Notes (Signed)
Regular diet lunch tray ordered 

## 2018-05-22 NOTE — ED Notes (Signed)
Regular Diet was ordered for Breakfast. 

## 2018-05-23 MED ORDER — PROSIGHT PO TABS
1.0000 | ORAL_TABLET | Freq: Every day | ORAL | Status: DC
  Filled 2018-05-23 (×3): qty 1

## 2018-05-23 MED ORDER — PALIPERIDONE ER 6 MG PO TB24
6.0000 mg | ORAL_TABLET | Freq: Every day | ORAL | Status: DC
  Administered 2018-05-25 – 2018-05-28 (×4): 6 mg via ORAL
  Filled 2018-05-23 (×7): qty 1

## 2018-05-23 MED ORDER — ADULT MULTIVITAMIN W/MINERALS CH
1.0000 | ORAL_TABLET | Freq: Every day | ORAL | Status: DC
  Administered 2018-05-23 – 2018-06-16 (×25): 1 via ORAL
  Filled 2018-05-23 (×28): qty 1

## 2018-05-23 NOTE — Progress Notes (Signed)
D. Pt presents as somewhat childlike, but friendly and cooperative upon approach. Pt observed interacting well with others in the milieu and attending group. Per pt's self inventory, pt rates her depression, hopelessness and anxiety an 8/5/1, respectively. Pt endorses passive SI but agrees to contact staff before acting on any harmful thoughts. Pt denies A/V hallucinations. A. Labs and vitals monitored. Pt compliant with medications. Pt supported emotionally and encouraged to express concerns and ask questions.   R. Pt remains safe with 15 minute checks. Will continue POC.

## 2018-05-23 NOTE — Progress Notes (Signed)
[facility] MD Progress Note  05/23/2018 12:33 PM Patricia [name]  MRN:  [id number] Subjective: Patient is seen and examined.  Patient is a 47 year old female with a past psychiatric history significant for schizophrenia versus schizoaffective disorder.  She is seen in follow-up.  She was admitted on 05/22/2018 with suicidal ideation as well as psychotic symptoms.  In examination today the patient stated she had taken her medications, but nursing notes from 7 PM to 7 AM reflected "I do not need meds, sweetie.  I am fine.  Doctors do not know what they are talking about".  She remains psychotic and circumstantial.  Her in the course of the interview today she talked about transcranial magnetic stimulation, herbal therapy for depression, St. John's wort, and other topics unrelated to the discussion.  She deflected questions with regard to her previous hospitalization as well as her current symptoms.  She is childlike in the interview.  She told me that she was not suicidal currently, but I told nursing staff earlier that part of her admission process was because she said she wanted to shoot herself in the head.  Her affect is incongruent with these comments.  Vital signs are stable, she is afebrile.  Nursing notes reflect that she slept 6.75 hours last night. Principal Problem: <principal problem not specified> Diagnosis:   Patient Active Problem List   Diagnosis Date Noted  . MDD (major depressive disorder), recurrent severe, without psychosis (HCC) [F33.2] 05/22/2018  . Suicidal ideation [R45.851]   . Paranoid schizophrenia (HCC) [F20.0]   . Depression [F32.9] 05/21/2018   Total Time spent with patient: 20 minutes  Past Psychiatric History: See admission H&P  Past Medical History:  Past Medical History:  Diagnosis Date  . Depression    History reviewed. No pertinent surgical history. Family History: History reviewed. No pertinent family history. Family Psychiatric  History: See admission  H&P Social History:  Social History   Substance and Sexual Activity  Alcohol Use Yes  . Alcohol/week: 1.0 standard drinks  . Types: 1 Glasses of wine per week   Comment: social      Social History   Substance and Sexual Activity  Drug Use No    Social History   Socioeconomic History  . Marital status: Divorced    Spouse name: Not on file  . Number of children: Not on file  . Years of education: Not on file  . Highest education level: Not on file  Occupational History  . Not on file  Social Needs  . Financial resource strain: Not on file  . Food insecurity:    Worry: Not on file    Inability: Not on file  . Transportation needs:    Medical: Not on file    Non-medical: Not on file  Tobacco Use  . Smoking status: Never Smoker  . Smokeless tobacco: Never Used  Substance and Sexual Activity  . Alcohol use: Yes    Alcohol/week: 1.0 standard drinks    Types: 1 Glasses of wine per week    Comment: social   . Drug use: No  . Sexual activity: Not on file  Lifestyle  . Physical activity:    Days per week: Not on file    Minutes per session: Not on file  . Stress: Not on file  Relationships  . Social connections:    Talks on phone: Not on file    Gets together: Not on file    Attends religious service: Not on file  Active member of club or organization: Not on file    Attends meetings of clubs or organizations: Not on file    Relationship status: Not on file  Other Topics Concern  . Not on file  Social History Narrative  . Not on file   Additional Social History:                         Sleep: Fair  Appetite:  Good  Current Medications: Current Facility-Administered Medications  Medication Dose Route Frequency Provider Last Rate Last Dose  . acetaminophen (TYLENOL) tablet 650 mg  650 mg Oral Q6H PRN Antonieta Pert, MD      . famotidine (PEPCID) tablet 20 mg  20 mg Oral Daily Antonieta Pert, MD   20 mg at 05/23/18 1011  . haloperidol  lactate (HALDOL) injection 2 mg  2 mg Intramuscular Q6H PRN Antonieta Pert, MD      . hydrOXYzine (ATARAX/VISTARIL) tablet 25 mg  25 mg Oral Q6H PRN Antonieta Pert, MD      . LORazepam (ATIVAN) tablet 1 mg  1 mg Oral Q4H PRN Antonieta Pert, MD       Or  . LORazepam (ATIVAN) injection 2 mg  2 mg Intramuscular Q4H PRN Antonieta Pert, MD      . magnesium hydroxide (MILK OF MAGNESIA) suspension 15 mL  15 mL Oral QHS PRN Antonieta Pert, MD      . multivitamin with minerals tablet 1 tablet  1 tablet Oral Daily Antonieta Pert, MD   1 tablet at 05/23/18 1208  . paliperidone (INVEGA) 24 hr tablet 6 mg  6 mg Oral QHS Antonieta Pert, MD      . traZODone (DESYREL) tablet 50 mg  50 mg Oral QHS PRN Antonieta Pert, MD        Lab Results:  Results for orders placed or performed during the hospital encounter of 05/22/18 (from the past 48 hour(s))  Rapid urine drug screen (hospital performed)     Status: None   Collection Time: 05/22/18  5:24 PM  Result Value Ref Range   Opiates NONE DETECTED NONE DETECTED   Cocaine NONE DETECTED NONE DETECTED   Benzodiazepines NONE DETECTED NONE DETECTED   Amphetamines NONE DETECTED NONE DETECTED   Tetrahydrocannabinol NONE DETECTED NONE DETECTED   Barbiturates NONE DETECTED NONE DETECTED    Comment: (NOTE) DRUG SCREEN FOR MEDICAL PURPOSES ONLY.  IF CONFIRMATION IS NEEDED FOR ANY PURPOSE, NOTIFY LAB WITHIN 5 DAYS. LOWEST DETECTABLE LIMITS FOR URINE DRUG SCREEN Drug Class                     Cutoff (ng/mL) Amphetamine and metabolites    1000 Barbiturate and metabolites    200 Benzodiazepine                 200 Tricyclics and metabolites     300 Opiates and metabolites        300 Cocaine and metabolites        300 THC                            50 Performed at [facility], [address] [place], [place] [address]   Urinalysis, Complete w Microscopic     Status: Abnormal   Collection Time: 05/22/18  5:24 PM   Result Value Ref Range  Color, Urine YELLOW YELLOW   APPearance CLEAR CLEAR   Specific Gravity, Urine 1.013 1.005 - 1.030   pH 6.0 5.0 - 8.0   Glucose, UA NEGATIVE NEGATIVE mg/dL   Hgb urine dipstick NEGATIVE NEGATIVE   Bilirubin Urine NEGATIVE NEGATIVE   Ketones, ur NEGATIVE NEGATIVE mg/dL   Protein, ur NEGATIVE NEGATIVE mg/dL   Nitrite NEGATIVE NEGATIVE   Leukocytes, UA NEGATIVE NEGATIVE   RBC / HPF 0-5 0 - 5 RBC/hpf   Bacteria, UA RARE (A) NONE SEEN   Squamous Epithelial / LPF 0-5 0 - 5   Mucus PRESENT     Comment: Performed at [facility], [address] [place], [place] [address]    Blood Alcohol level:  Lab Results  Component Value Date   ETH <10 05/21/2018   ETH <5 09/12/2015    Metabolic Disorder Labs: No results found for: HGBA1C, MPG No results found for: PROLACTIN No results found for: CHOL, TRIG, HDL, CHOLHDL, VLDL, LDLCALC  Physical Findings: AIMS: Facial and Oral Movements Muscles of Facial Expression: None, normal Lips and Perioral Area: None, normal Jaw: None, normal Tongue: None, normal,Extremity Movements Upper (arms, wrists, hands, fingers): None, normal Lower (legs, knees, ankles, toes): None, normal, Trunk Movements Neck, shoulders, hips: None, normal, Overall Severity Severity of abnormal movements (highest score from questions above): None, normal Incapacitation due to abnormal movements: None, normal Patient's awareness of abnormal movements (rate only patient's report): No Awareness, Dental Status Current problems with teeth and/or dentures?: No Does patient usually wear dentures?: No  CIWA:  CIWA-Ar Total: 0 COWS:  COWS Total Score: 0  Musculoskeletal: Strength & Muscle Tone: within normal limits Gait & Station: normal Patient leans: N/A  Psychiatric Specialty Exam: Physical Exam  Nursing note and vitals reviewed. Constitutional: She is oriented to person, place, and time. She appears well-developed and  well-nourished.  HENT:  Head: Normocephalic and atraumatic.  Respiratory: Effort normal.  Neurological: She is alert and oriented to person, place, and time.    ROS  Blood pressure 112/73, pulse 91, temperature 98.5 F (36.9 C), resp. rate 18, height 5\' 3"  (1.6 m), weight 61.2 kg, last menstrual period 05/07/2018, SpO2 100 %.Body mass index is 23.91 kg/m.  General Appearance: Casual  Eye Contact:  Fair  Speech:  Normal Rate  Volume:  Normal  Mood:  Euphoric  Affect:  Congruent  Thought Process:  Goal Directed and Descriptions of Associations: Circumstantial  Orientation:  Full (Time, Place, and Person)  Thought Content:  Delusions  Suicidal Thoughts:  Yes.  without intent/plan  Homicidal Thoughts:  No  Memory:  Immediate;   Poor Recent;   Poor Remote;   Poor  Judgement:  Impaired  Insight:  Lacking  Psychomotor Activity:  Increased  Concentration:  Concentration: Fair and Attention Span: Fair  Recall:  [employer] of Knowledge:  Fair  Language:  Fair  Akathisia:  Negative  Handed:  Right  AIMS (if indicated):     Assets:  Desire for Improvement Physical Health Resilience  ADL's:  Intact  Cognition:  WNL  Sleep:  Number of Hours: 6.75     Treatment Plan Summary: Daily contact with patient to assess and evaluate symptoms and progress in treatment, Medication management and Plan : Patient is seen and examined.  Patient is a 47 year old female with the above-stated past psychiatric history who is seen in follow-up.  #1 schizophrenia versus schizoaffective disorder versus bipolar disorder with psychotic features-I am going to increase her paliperidone to 6  mg p.o. nightly.  Hopefully she will take this.  Her last hospitalization at [facility] reflected that she was in the hospital for 19 days.  Much of that time.  Was because she had refused medications.  Right now I do not think we can force medications on her, but this may become a possibility in the future.  We will try and  encourage her to be compliant.  #2 disposition planning-in progress.  Antonieta Pert, MD 05/23/2018, 12:33 PM

## 2018-05-23 NOTE — [facility] Group Notes (Signed)
  [facility] LCSW Group Therapy Note  Date/Time:  05/23/2018 11:15AM-12:00PM  Type of Therapy and Topic:  Group Therapy:  Reasons for Hospitalizations  Participation Level:  Active   Description of Group This process group involved patients discussing their thoughts about why people, in their experiences, may need to be hospitalized or may be seen by family members/people in the community as needing to be hospitalized.   Feelings related to these reasons were discussed and group members were allowed to vent their positive and negative reactions.  The group then brainstormed specific ways in which they could take care of themselves outside of the hospital in order to avoid rehospitalization.  CSW ensured that this list included such items as staying on medications, going to aftercare appointments, taking care of sleep hygiene, using a pillbox, open communication with support systems, and truthfully acknowledging symptoms to doctors, therapists, and supporters.  Therapeutic Goals 1. Patient will identify a variety of reasons that exist for people to become psychiatrically hospitalized, and will discuss why it may be necessary even though it is not desired/liked. 2. Patient will verbalize benefits of hospitalization to those who need it and ways in which those benefits can be replicated in an outpatient setting. 3. Patients will brainstorm together ways they can take care of themselves after discharge in order to avoid rehospitalization.  Summary of Patient Progress:  The patient expressed she has been hospitalized because she felt suicidal.  She spoke little in group initially, but then opened up and spoke freely.   In response to another patient's question, CSW provided psychoeducation about the diagnoses of Bipolar disorder, Schizophrenia, and Schizoaffective disorder.  She expressed an interest in learning more about her own diagnosis.  Therapeutic Modalities Cognitive Behavioral  Therapy Motivational Interviewing    Ambrose Mantle, LCSW 05/23/2018, 8:46 AM

## 2018-05-23 NOTE — Progress Notes (Signed)
Did not attend group 

## 2018-05-23 NOTE — Progress Notes (Signed)
D. Pt observed in the milieu early this evening, then went on to bed. Pt  denied SI/HI and AV hallucinations A. Labs and vitals monitored. Pt compliant with medications. Pt supported emotionally and encouraged to express concerns and ask questions.   R. Pt remains safe, resting in bed with eyes closed, with 15 minute checks. Will continue POC.
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
## 2018-05-23 NOTE — [facility] Suicide Risk Assessment (Signed)
[facility] INPATIENT:  Family/Significant Other Suicide Prevention Education  Suicide Prevention Education:  Patient Refusal for Family/Significant Other Suicide Prevention Education: The patient Christal L [name] has refused to provide written consent for family/significant other to be provided Family/Significant Other Suicide Prevention Education during admission and/or prior to discharge.  Physician notified.  Suicide Prevention Education was reviewed thoroughly with patient, including risk factors, warning signs, and what to do.  Mobile Crisis services were described and that telephone number pointed out, with encouragement to patient to put this number in personal cell phone.  Brochure was provided to patient to share with natural supports.  Patient acknowledged the ways in which they are at risk, and how working through each of their issues can gradually start to reduce their risk factors.  Patient was encouraged to think of the information in the context of people in their own lives.  Patient denied having access to firearms  Patient verbalized understanding of information provided.  Patient IS NOT YET ABLE TO ENDORSE A DESIRE TO LIVE.  She did agree to continue thinking about whether we can contact a family member.      Carloyn Jaeger Grossman-Orr 05/23/2018, 3:08 PM

## 2018-05-23 NOTE — [facility] Group Notes (Signed)
[facility] Group Notes:  (Nursing)  Date:  05/23/2018  Time:  1000 Type of Therapy:  Nurse Education  Participation Level:  Active  Participation Quality:  Appropriate  Affect:  Appropriate  Cognitive:  Appropriate  Insight:  Improving  Engagement in Group:  Improving  Modes of Intervention:  Discussion and Education  Summary of Progress/Problems:  Nurse led group played a non competitive learning/communication board game that fosters listening skills as well as self expression  Shela Nevin 05/23/2018, 4:44 PM

## 2018-05-24 LAB — HEMOGLOBIN A1C
Hgb A1c MFr Bld: 5.5 % (ref 4.8–5.6)
Mean Plasma Glucose: 111.15 mg/dL

## 2018-05-24 LAB — TSH: TSH: 0.855 u[IU]/mL (ref 0.350–4.500)

## 2018-05-24 MED ORDER — PALIPERIDONE ER 3 MG PO TB24
3.0000 mg | ORAL_TABLET | Freq: Once | ORAL | Status: AC
  Administered 2018-05-24: 3 mg via ORAL

## 2018-05-24 NOTE — Plan of Care (Signed)
Progress note  D: pt found in bed; pt compliant with medication administration. Pt states she slept well last night. Pt rates her depression/hopelessness/anxiety a 7/5/3 out of 10 respectively. Pt has complaints of being lightheaded and dizziness but denies any physical pain, rating this a 0/10. Pt states her goal for today is to get her kids but failed to state how she would achieve this. Pt denies any si/hi/ah/vh and verbally agrees to approach staff if these become apparent or before harming herself while at [facility]. A: pt provided support and encouragement. Pt given medications per protocol and standing orders. Q49m safety checks implemented and continued.  R: pt safe on the unit. Will continue to monitor.   Pt progressing in the following metrics  Problem: Activity: Goal: Interest or engagement in activities will improve Outcome: Progressing Goal: Sleeping patterns will improve Outcome: Progressing   Problem: Coping: Goal: Ability to demonstrate self-control will improve Outcome: Progressing   Problem: Health Behavior/Discharge Planning: Goal: Compliance with treatment plan for underlying cause of condition will improve Outcome: Progressing   Problem: Physical Regulation: Goal: Ability to maintain clinical measurements within normal limits will improve Outcome: Progressing   Problem: Safety: Goal: Periods of time without injury will increase Outcome: Progressing   Problem: Activity: Goal: Interest or engagement in leisure activities will improve Outcome: Progressing Goal: Imbalance in normal sleep/wake cycle will improve Outcome: Progressing

## 2018-05-24 NOTE — Progress Notes (Signed)
[facility] MD Progress Note  05/24/2018 12:39 PM Patricia [name]  MRN:  [id number] Subjective:  Patient is seen and examined.  Patient is a 47 year old female with a past psychiatric history significant for schizophrenia versus psychosis.  The patient originally presented to the [facility] emergency department with complaints of suicidal ideation.    Objective: Patient is seen and examined.  Patient is a 47 year old female with a past psychiatric history significant for schizophrenia.  She is seen in follow-up.  She still remains quite childlike and basically at times and appropriate.  She told me this morning she had taken her medication, but the 2 of [place] went to the nursing station together, and the nurses showed her that she had not taken the paliperidone last night.  She is at times disorganized.  She continues to be delusional.  She showed me her design of an electronic device for depression.  Social worker stated that she had to inform the patient that her father died previously.  Initially she stated that she was unaware that he had died.  She makes no mention of that today, and shows no grief.  She denied auditory or visual hallucinations.  She still is delusional.  We discussed compliance with medications.  I reminded her that she remained in the hospital in [facility] for 19 days until she became compliant.  She denied any suicidal or homicidal ideation.  She slept 3.75 hours last night.  Vital signs are stable, she remains afebrile. Principal Problem: <principal problem not specified> Diagnosis:   Patient Active Problem List   Diagnosis Date Noted  . MDD (major depressive disorder), recurrent severe, without psychosis (HCC) [F33.2] 05/22/2018  . Suicidal ideation [R45.851]   . Paranoid schizophrenia (HCC) [F20.0]   . Depression [F32.9] 05/21/2018   Total Time spent with patient: 15 minutes  Past Psychiatric History: See admission H&P  Past Medical History:  Past Medical History:   Diagnosis Date  . Depression    History reviewed. No pertinent surgical history. Family History: History reviewed. No pertinent family history. Family Psychiatric  History: See admission H&P Social History:  Social History   Substance and Sexual Activity  Alcohol Use Yes  . Alcohol/week: 1.0 standard drinks  . Types: 1 Glasses of wine per week   Comment: social      Social History   Substance and Sexual Activity  Drug Use No    Social History   Socioeconomic History  . Marital status: Divorced    Spouse name: Not on file  . Number of children: Not on file  . Years of education: Not on file  . Highest education level: Not on file  Occupational History  . Not on file  Social Needs  . Financial resource strain: Not on file  . Food insecurity:    Worry: Not on file    Inability: Not on file  . Transportation needs:    Medical: Not on file    Non-medical: Not on file  Tobacco Use  . Smoking status: Never Smoker  . Smokeless tobacco: Never Used  Substance and Sexual Activity  . Alcohol use: Yes    Alcohol/week: 1.0 standard drinks    Types: 1 Glasses of wine per week    Comment: social   . Drug use: No  . Sexual activity: Not on file  Lifestyle  . Physical activity:    Days per week: Not on file    Minutes per session: Not on file  . Stress: Not  on file  Relationships  . Social connections:    Talks on phone: Not on file    Gets together: Not on file    Attends religious service: Not on file    Active member of club or organization: Not on file    Attends meetings of clubs or organizations: Not on file    Relationship status: Not on file  Other Topics Concern  . Not on file  Social History Narrative  . Not on file   Additional Social History:                         Sleep: Poor  Appetite:  Fair  Current Medications: Current Facility-Administered Medications  Medication Dose Route Frequency Provider Last Rate Last Dose  . acetaminophen  (TYLENOL) tablet 650 mg  650 mg Oral Q6H PRN Antonieta Pert, MD   650 mg at 05/24/18 0736  . famotidine (PEPCID) tablet 20 mg  20 mg Oral Daily Antonieta Pert, MD   20 mg at 05/24/18 0734  . haloperidol lactate (HALDOL) injection 2 mg  2 mg Intramuscular Q6H PRN Antonieta Pert, MD      . hydrOXYzine (ATARAX/VISTARIL) tablet 25 mg  25 mg Oral Q6H PRN Antonieta Pert, MD      . LORazepam (ATIVAN) tablet 1 mg  1 mg Oral Q4H PRN Antonieta Pert, MD       Or  . LORazepam (ATIVAN) injection 2 mg  2 mg Intramuscular Q4H PRN Antonieta Pert, MD      . magnesium hydroxide (MILK OF MAGNESIA) suspension 15 mL  15 mL Oral QHS PRN Antonieta Pert, MD      . multivitamin with minerals tablet 1 tablet  1 tablet Oral Daily Antonieta Pert, MD   1 tablet at 05/24/18 0734  . paliperidone (INVEGA) 24 hr tablet 6 mg  6 mg Oral QHS Antonieta Pert, MD      . traZODone (DESYREL) tablet 50 mg  50 mg Oral QHS PRN Antonieta Pert, MD        Lab Results:  Results for orders placed or performed during the hospital encounter of 05/22/18 (from the past 48 hour(s))  Rapid urine drug screen (hospital performed)     Status: None   Collection Time: 05/22/18  5:24 PM  Result Value Ref Range   Opiates NONE DETECTED NONE DETECTED   Cocaine NONE DETECTED NONE DETECTED   Benzodiazepines NONE DETECTED NONE DETECTED   Amphetamines NONE DETECTED NONE DETECTED   Tetrahydrocannabinol NONE DETECTED NONE DETECTED   Barbiturates NONE DETECTED NONE DETECTED    Comment: (NOTE) DRUG SCREEN FOR MEDICAL PURPOSES ONLY.  IF CONFIRMATION IS NEEDED FOR ANY PURPOSE, NOTIFY LAB WITHIN 5 DAYS. LOWEST DETECTABLE LIMITS FOR URINE DRUG SCREEN Drug Class                     Cutoff (ng/mL) Amphetamine and metabolites    1000 Barbiturate and metabolites    200 Benzodiazepine                 200 Tricyclics and metabolites     300 Opiates and metabolites        300 Cocaine and metabolites        300 THC                             50  Performed at [facility], [address], [place], [place] [address]   Urinalysis, Complete w Microscopic     Status: Abnormal   Collection Time: 05/22/18  5:24 PM  Result Value Ref Range   Color, Urine YELLOW YELLOW   APPearance CLEAR CLEAR   Specific Gravity, Urine 1.013 1.005 - 1.030   pH 6.0 5.0 - 8.0   Glucose, UA NEGATIVE NEGATIVE mg/dL   Hgb urine dipstick NEGATIVE NEGATIVE   Bilirubin Urine NEGATIVE NEGATIVE   Ketones, ur NEGATIVE NEGATIVE mg/dL   Protein, ur NEGATIVE NEGATIVE mg/dL   Nitrite NEGATIVE NEGATIVE   Leukocytes, UA NEGATIVE NEGATIVE   RBC / HPF 0-5 0 - 5 RBC/hpf   Bacteria, UA RARE (A) NONE SEEN   Squamous Epithelial / LPF 0-5 0 - 5   Mucus PRESENT     Comment: Performed at [facility], [address], [place], [place] [address]  Hemoglobin A1c     Status: None   Collection Time: 05/24/18  6:30 AM  Result Value Ref Range   Hgb A1c MFr Bld 5.5 4.8 - 5.6 %    Comment: (NOTE) Pre diabetes:          5.7%-6.4% Diabetes:              >6.4% Glycemic control for   <7.0% adults with diabetes    Mean Plasma Glucose 111.15 mg/dL    Comment: Performed at [facility], [address], [place], [place] [address]  TSH     Status: None   Collection Time: 05/24/18  6:30 AM  Result Value Ref Range   TSH 0.855 0.350 - 4.500 uIU/mL    Comment: Performed by a 3rd Generation assay with a functional sensitivity of <=0.01 uIU/mL. Performed at [facility], [address], [place], [place] [address]     Blood Alcohol level:  Lab Results  Component Value Date   ETH <10 05/21/2018   ETH <5 09/12/2015    Metabolic Disorder Labs: Lab Results  Component Value Date   HGBA1C 5.5 05/24/2018   MPG 111.15 05/24/2018   No results found for: PROLACTIN No results found for: CHOL, TRIG, HDL, CHOLHDL, VLDL, LDLCALC  Physical Findings: AIMS: Facial and Oral Movements Muscles of Facial  Expression: None, normal Lips and Perioral Area: None, normal Jaw: None, normal Tongue: None, normal,Extremity Movements Upper (arms, wrists, hands, fingers): None, normal Lower (legs, knees, ankles, toes): None, normal, Trunk Movements Neck, shoulders, hips: None, normal, Overall Severity Severity of abnormal movements (highest score from questions above): None, normal Incapacitation due to abnormal movements: None, normal Patient's awareness of abnormal movements (rate only patient's report): No Awareness, Dental Status Current problems with teeth and/or dentures?: No Does patient usually wear dentures?: No  CIWA:  CIWA-Ar Total: 0 COWS:  COWS Total Score: 0  Musculoskeletal: Strength & Muscle Tone: within normal limits Gait & Station: normal Patient leans: N/A  Psychiatric Specialty Exam: Physical Exam  Nursing note and vitals reviewed. Constitutional: She is oriented to person, place, and time. She appears well-developed and well-nourished.  HENT:  Head: Normocephalic and atraumatic.  Respiratory: Effort normal.  Neurological: She is alert and oriented to person, place, and time.    ROS  Blood pressure 112/73, pulse 91, temperature 98.5 F (36.9 C), resp. rate 18, height 5\' 3"  (1.6 m), weight 61.2 kg, last menstrual period 05/07/2018, SpO2 100 %.Body mass index is 23.91 kg/m.  General Appearance: Casual  Eye Contact:  Fair  Speech:  Normal Rate  Volume:  Normal  Mood:  Euphoric  Affect:  Congruent  Thought Process:  Goal Directed and Descriptions of Associations: Loose  Orientation:  Full (Time, Place, and Person)  Thought Content:  Delusions, Ideas of Reference:   Paranoia Delusions and Paranoid Ideation  Suicidal Thoughts:  No  Homicidal Thoughts:  No  Memory:  Immediate;   Fair Recent;   Fair Remote;   Fair  Judgement:  Impaired  Insight:  Lacking  Psychomotor Activity:  Increased  Concentration:  Concentration: Fair and Attention Span: Fair  Recall:  [employer] of Knowledge:  Fair  Language:  Good  Akathisia:  Negative  Handed:  Right  AIMS (if indicated):     Assets:  Communication Skills Desire for Improvement Physical Health Resilience  ADL's:  Intact  Cognition:  WNL  Sleep:  Number of Hours: 3.75     Treatment Plan Summary: Daily contact with patient to assess and evaluate symptoms and progress in treatment, Medication management and Plan : Patient is seen and examined.  Patient is a 47 year old female with the above-stated past psychiatric history who is seen in follow-up.  #1 schizophrenia versus schizoaffective disorder versus bipolar disorder with psychotic features-I have given her paliperidone 3 mg this a.m., and I have encouraged her to be compliant with her medications.  She still has 6 mg of paliperidone for this evening at at bedtime.  No other changes in these medicines.  #2 disposition planning-in progress.  Antonieta Pert, MD 05/24/2018, 12:39 PM

## 2018-05-24 NOTE — Progress Notes (Signed)
Patient is resting in bed with eyes closed. Respirations even and non labored. No distress noted. Monitoring continues. 

## 2018-05-24 NOTE — Progress Notes (Signed)
D: Pt denies SI/HI/AVH.  Pt paranoid , bizarre at times. Pt refused he night dose of Invega. Pt stated she wanted to talk to the doctor about getting back on Wellbutrin . Pt kept to herself this evening and avoided writer this evening.   A: Pt was offered support and encouragement. Pt refused scheduled medications. Pt was encourage to attend groups. Q 15 minute checks were done for safety.   R: Pt has no complaints.Pt  Not receptive to treatment and safety maintained on unit.   Problem: Education: Goal: Emotional status will improve Outcome: Not Progressing   Problem: Education: Goal: Mental status will improve Outcome: Not Progressing   Problem: Activity: Goal: Interest or engagement in activities will improve Outcome: Not Progressing   Problem: Activity: Goal: Sleeping patterns will improve Outcome: Not Progressing   Problem: Health Behavior/Discharge Planning: Goal: Compliance with treatment plan for underlying cause of condition will improve Outcome: Not Progressing   Problem: Safety: Goal: Periods of time without injury will increase Outcome: Progressing

## 2018-05-24 NOTE — [facility] Group Notes (Signed)
Adult Psychoeducational Group Note  Date:  05/24/2018 Time:  9:01 AM  Group Topic/Focus:  Goals Group:   The focus of this group is to help patients establish daily goals to achieve during treatment and discuss how the patient can incorporate goal setting into their daily lives to aide in recovery.  Participation Level:  Active  Participation Quality:  Appropriate  Affect:  Appropriate  Cognitive:  Alert  Insight: Appropriate  Engagement in Group:  Engaged  Modes of Intervention:  Orientation  Pt attend orientation/goals group. Pt goal for today is to address issues that she have. Pt wants to talk to doctor and discuss discharge plan.   Additional Comments:    Dellia Nims 05/24/2018, 9:01 AM

## 2018-05-24 NOTE — [facility] Group Notes (Signed)
[facility] LCSW Group Therapy Note  Date/Time:  05/24/2018  11:00AM-12:00PM  Type of Therapy and Topic:  Group Therapy:  Music and Mood  Participation Level:  Active   Description of Group: In this process group, members listened to a variety of genres of music and identified that different types of music evoke different responses.  Patients were encouraged to identify music that was soothing for them and music that was energizing for them.  Patients discussed how this knowledge can help with wellness and recovery in various ways including managing depression and anxiety as well as encouraging healthy sleep habits.    Therapeutic Goals: 1. Patients will explore the impact of different varieties of music on mood 2. Patients will verbalize the thoughts they have when listening to different types of music 3. Patients will identify music that is soothing to them as well as music that is energizing to them 4. Patients will discuss how to use this knowledge to assist in maintaining wellness and recovery 5. Patients will explore the use of music as a coping skill  Summary of Patient Progress:  At the beginning of group, patient expressed that she was sad, "feel like I'm drowning," and said this while smiling incongruently.  She danced and sang quite a bit in group and at the end said her sadness was now at a 7.  Therapeutic Modalities: Solution Focused Brief Therapy Activity   Ambrose Mantle, LCSW

## 2018-05-25 DIAGNOSIS — F250 Schizoaffective disorder, bipolar type: Secondary | ICD-10-CM

## 2018-05-25 DIAGNOSIS — F419 Anxiety disorder, unspecified: Secondary | ICD-10-CM

## 2018-05-25 DIAGNOSIS — R197 Diarrhea, unspecified: Secondary | ICD-10-CM

## 2018-05-25 DIAGNOSIS — R451 Restlessness and agitation: Secondary | ICD-10-CM

## 2018-05-25 DIAGNOSIS — G4700 Insomnia, unspecified: Secondary | ICD-10-CM

## 2018-05-25 MED ORDER — SERTRALINE HCL 50 MG PO TABS
50.0000 mg | ORAL_TABLET | Freq: Every day | ORAL | Status: DC
  Administered 2018-05-25 – 2018-06-16 (×23): 50 mg via ORAL
  Filled 2018-05-25 (×27): qty 1

## 2018-05-25 MED ORDER — LOPERAMIDE HCL 2 MG PO CAPS: 2.0000 mg | ORAL_CAPSULE | ORAL | Status: DC | PRN

## 2018-05-25 NOTE — Progress Notes (Signed)
Recreation Therapy Notes  INPATIENT RECREATION THERAPY ASSESSMENT  Patient Details Name: LOKELANI LUTES MRN: [id number] DOB: 02-02-1971 Today's Date: 05/25/2018       Information Obtained From: Patient  Able to Participate in Assessment/Interview: Yes  Patient Presentation: Alert  Reason for Admission (Per Patient): Suicidal Ideation  Patient Stressors: Family, Relationship, Work, Other (Comment)(Financial)  Coping Skills:   Isolation, Arguments, Aggression, Exercise, Impulsivity, Talk, Art, Avoidance  Leisure Interests (2+):  Art - Draw, Art - Coloring  Frequency of Recreation/Participation: Weekly  Awareness of Community Resources:  Yes  Community Resources:  Library  Current Use: Yes  If no, Barriers?:    Expressed Interest in [employer] Information: Yes  County of Residence:  [place]  Patient Main Form of Transportation: Therapist, music  Patient Strengths:  Like to laugh; Draw  Patient Identified Areas of Improvement:  Getting money  Patient Goal for Hospitalization:  "To address some of my problems"  Current SI (including self-harm):  Yes(Rated a 7 out of 10; contracts for safety)  Current HI:  No  Current AVH: No  Staff Intervention Plan: Group Attendance, Collaborate with Interdisciplinary Treatment Team  Consent to Intern Participation: N/A    Caroll Rancher, LRT/CTRS  Caroll Rancher A 05/25/2018, 3:02 PM
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
## 2018-05-25 NOTE — Progress Notes (Signed)
D: Pt was in bed in her room upon initial approach.  Pt presents with depressed affect and mood.  She describes her day as "okay."  She denies having a goal tonight.  She reports the best part of her day was "going outside" where she "sat in the sun by the fountain."  Pt endorses SI "sometimes" with a plan of "shooting myself."  Pt denies HI, denies hallucinations, reports back pain of 7/10.  Pt was isolative to her room at the beginning of the shift but came to the dayroom after taking HS medications.  Pt did not attend evening group.  A: Introduced self to pt.  Actively listened to pt and offered support and encouragement. Medication administered per order.  PRN medication administered for pain.  Heat packs offered for pain, pt declined.  Q15 minute safety checks maintained.  R: Pt is safe on the unit.  Pt would only take one tab of Tylenol 325 mg instead of the 650 mg ordered.  She states "I only take one at time."  Pt verbally contracts for safety.  Will continue to monitor and assess.

## 2018-05-25 NOTE — [facility] Group Notes (Signed)
[facility] LCSW Group Therapy Note  Date/Time: 05/25/18, 1315  Type of Therapy and Topic:  Group Therapy:  Overcoming Obstacles  Participation Level:  none  Description of Group:    In this group patients will be encouraged to explore what they see as obstacles to their own wellness and recovery. They will be guided to discuss their thoughts, feelings, and behaviors related to these obstacles. The group will process together ways to cope with barriers, with attention given to specific choices patients can make. Each patient will be challenged to identify changes they are motivated to make in order to overcome their obstacles. This group will be process-oriented, with patients participating in exploration of their own experiences as well as giving and receiving support and challenge from other group members.  Therapeutic Goals: 1. Patient will identify personal and current obstacles as they relate to admission. 2. Patient will identify barriers that currently interfere with their wellness or overcoming obstacles.  3. Patient will identify feelings, thought process and behaviors related to these barriers. 4. Patient will identify two changes they are willing to make to overcome these obstacles:    Summary of Patient Progress: Pt came to group but was called out near the beginning to go and meet with the MD.      Therapeutic Modalities:   Cognitive Behavioral Therapy Solution Focused Therapy Motivational Interviewing Relapse Prevention Therapy  Daleen Squibb, LCSW

## 2018-05-25 NOTE — Tx Team (Signed)
Interdisciplinary Treatment and Diagnostic Plan Update  05/25/2018 Time of Session: [place] MRN: [id number]  Principal Diagnosis: <principal problem not specified>  Secondary Diagnoses: Active Problems:   MDD (major depressive disorder), recurrent severe, without psychosis ([place])   Paranoid schizophrenia ([place])   Current Medications:  Current Facility-Administered Medications  Medication Dose Route Frequency Provider Last Rate Last Dose  . acetaminophen (TYLENOL) tablet 650 mg  650 mg Oral Q6H PRN Sharma Covert, MD   650 mg at 05/24/18 0736  . famotidine (PEPCID) tablet 20 mg  20 mg Oral Daily Sharma Covert, MD   20 mg at 05/25/18 0962  . haloperidol lactate (HALDOL) injection 2 mg  2 mg Intramuscular Q6H PRN Sharma Covert, MD      . hydrOXYzine (ATARAX/VISTARIL) tablet 25 mg  25 mg Oral Q6H PRN Sharma Covert, MD      . LORazepam (ATIVAN) tablet 1 mg  1 mg Oral Q4H PRN Sharma Covert, MD       Or  . LORazepam (ATIVAN) injection 2 mg  2 mg Intramuscular Q4H PRN Sharma Covert, MD      . magnesium hydroxide (MILK OF MAGNESIA) suspension 15 mL  15 mL Oral QHS PRN Sharma Covert, MD      . multivitamin with minerals tablet 1 tablet  1 tablet Oral Daily Sharma Covert, MD   1 tablet at 05/25/18 [phone]  . paliperidone (INVEGA) 24 hr tablet 6 mg  6 mg Oral QHS Sharma Covert, MD      . traZODone (DESYREL) tablet 50 mg  50 mg Oral QHS PRN Sharma Covert, MD       PTA Medications: Medications Prior to Admission  Medication Sig Dispense Refill Last Dose  . acetaminophen (TYLENOL) 325 MG tablet Take 325-650 mg by mouth every 6 (six) hours as needed for mild pain, moderate pain or headache.   Unk at [facility]  . aspirin-acetaminophen-caffeine (EXCEDRIN MIGRAINE) [phone] MG tablet Take 1 tablet by mouth every 6 (six) hours as needed for headache or migraine.   Unk at [facility]    Patient Stressors: Financial difficulties Marital or family  conflict Occupational concerns  Patient Strengths: Capable of independent living Motivation for treatment/growth Supportive family/friends  Treatment Modalities: Medication Management, Group therapy, Case management,  1 to 1 session with clinician, Psychoeducation, Recreational therapy.   Physician Treatment Plan for Primary Diagnosis: <principal problem not specified> Long Term Goal(s): Improvement in symptoms so as ready for discharge Improvement in symptoms so as ready for discharge   Short Term Goals: Ability to identify changes in lifestyle to reduce recurrence of condition will improve Ability to verbalize feelings will improve Ability to disclose and discuss suicidal ideas Ability to demonstrate self-control will improve Ability to identify and develop effective coping behaviors will improve Ability to maintain clinical measurements within normal limits will improve Compliance with prescribed medications will improve Ability to identify changes in lifestyle to reduce recurrence of condition will improve Ability to verbalize feelings will improve Ability to disclose and discuss suicidal ideas Ability to demonstrate self-control will improve Ability to identify and develop effective coping behaviors will improve Ability to maintain clinical measurements within normal limits will improve Compliance with prescribed medications will improve  Medication Management: Evaluate patient's response, side effects, and tolerance of medication regimen.  Therapeutic Interventions: 1 to 1 sessions, Unit Group sessions and Medication administration.  Evaluation of Outcomes: Not Met  Physician Treatment Plan for Secondary Diagnosis: Active Problems:  MDD (major depressive disorder), recurrent severe, without psychosis ([place])   Paranoid schizophrenia ([place])  Long Term Goal(s): Improvement in symptoms so as ready for discharge Improvement in symptoms so as ready for discharge   Short Term  Goals: Ability to identify changes in lifestyle to reduce recurrence of condition will improve Ability to verbalize feelings will improve Ability to disclose and discuss suicidal ideas Ability to demonstrate self-control will improve Ability to identify and develop effective coping behaviors will improve Ability to maintain clinical measurements within normal limits will improve Compliance with prescribed medications will improve Ability to identify changes in lifestyle to reduce recurrence of condition will improve Ability to verbalize feelings will improve Ability to disclose and discuss suicidal ideas Ability to demonstrate self-control will improve Ability to identify and develop effective coping behaviors will improve Ability to maintain clinical measurements within normal limits will improve Compliance with prescribed medications will improve     Medication Management: Evaluate patient's response, side effects, and tolerance of medication regimen.  Therapeutic Interventions: 1 to 1 sessions, Unit Group sessions and Medication administration.  Evaluation of Outcomes: Not Met   RN Treatment Plan for Primary Diagnosis: <principal problem not specified> Long Term Goal(s): Knowledge of disease and therapeutic regimen to maintain health will improve  Short Term Goals: Ability to identify and develop effective coping behaviors will improve and Compliance with prescribed medications will improve  Medication Management: RN will administer medications as ordered by provider, will assess and evaluate patient's response and provide education to patient for prescribed medication. RN will report any adverse and/or side effects to prescribing provider.  Therapeutic Interventions: 1 on 1 counseling sessions, Psychoeducation, Medication administration, Evaluate responses to treatment, Monitor vital signs and CBGs as ordered, Perform/monitor CIWA, COWS, AIMS and Fall Risk screenings as ordered,  Perform wound care treatments as ordered.  Evaluation of Outcomes: Not Met   LCSW Treatment Plan for Primary Diagnosis: <principal problem not specified> Long Term Goal(s): Safe transition to appropriate next level of care at discharge, Engage patient in therapeutic group addressing interpersonal concerns.  Short Term Goals: Engage patient in aftercare planning with referrals and resources, Increase social support, Facilitate acceptance of mental health diagnosis and concerns and Increase skills for wellness and recovery  Therapeutic Interventions: Assess for all discharge needs, 1 to 1 time with Social worker, Explore available resources and support systems, Assess for adequacy in community support network, Educate family and significant other(s) on suicide prevention, Complete Psychosocial Assessment, Interpersonal group therapy.  Evaluation of Outcomes: Not Met   Progress in Treatment: Attending groups: Yes. Participating in groups: Yes. Taking medication as prescribed: Yes. Toleration medication: Yes. Family/Significant other contact made: No, will contact:  when given permission Patient understands diagnosis: No. Discussing patient identified problems/goals with staff: Yes. Medical problems stabilized or resolved: Yes. Denies suicidal/homicidal ideation: Yes. Issues/concerns per patient self-inventory: No. Other: none  New problem(s) identified: No, Describe:  none  New Short Term/Long Term Goal(s):  Patient Goals:  "address my issues that led to my depression: no good place to live, trouble getting disability, don't know where by children."  Discharge Plan or Barriers:   Reason for Continuation of Hospitalization: Delusions  Depression Hallucinations Medication stabilization  Estimated Length of Stay: 3-5 days.  Attendees: Patient: Patricia [name] 05/25/2018   Physician: Dr. Nancy Fetter, MD 05/25/2018   Nursing:  05/25/2018   RN Care Manager: 05/25/2018    Social Worker: Lurline Idol, LCSW 05/25/2018   Recreational Therapist:  05/25/2018   Other:  05/25/2018  Other:  05/25/2018   Other: 05/25/2018        Scribe for Treatment Team: Joanne Chars, [place] 05/25/2018 11:20 AM

## 2018-05-25 NOTE — Plan of Care (Signed)
Progress Note  D: pt found in bed; compliant with medication administration. Pt states she slept well. Pt rates her depression/hopelessness/anxiety an 8/5/2 out of 10 respectively. Pt complains of lightheadedness, dizziness, and shakes. Pt also complains of pain on her self inventory that she rates at a 6.5/10. Pt denied any physical pain to this Clinical research associate. Pt states her goal for today is to identify a better place to live with her children but isn't sure how she will achieve this. Pt endorses si, but doesn't have a plan and verbally agrees to approach staff if these or any hi/ah/vh become apparent, or before harming herself while at [facility].  A: pt provided support and encouragement. Pt given medication per protocol and standing orders. Q39m safety checks implemented and continued.  R: pt safe on the unit. Will continue to monitor.   Pt progressing in the following metrics  Problem: Safety: Goal: Ability to disclose and discuss suicidal ideas will improve Outcome: Progressing   Problem: Self-Concept: Goal: Will verbalize positive feelings about self Outcome: Progressing Goal: Level of anxiety will decrease Outcome: Progressing   Problem: Education: Goal: Ability to make informed decisions regarding treatment will improve Outcome: Progressing   Problem: Coping: Goal: Coping ability will improve Outcome: Progressing   Problem: Health Behavior/Discharge Planning: Goal: Identification of resources available to assist in meeting health care needs will improve Outcome: Progressing   Problem: Medication: Goal: Compliance with prescribed medication regimen will improve Outcome: Progressing

## 2018-05-25 NOTE — Progress Notes (Signed)
Recreation Therapy Notes  Date: 10.14.19 Time: 1000 Location: 500 Hall Dayroom  Group Topic: Communication  Goal Area(s) Addresses:  Patient will identify triggers. Patient will identify ways to avoid triggers. Patient will identify ways to deal with triggers head on.  Intervention: Worksheet, pencils  Activity: Triggers.  Patients were to identify their 3 biggest triggers, strategies to avoid those triggers and strategies to address triggers head on.  Education: Communication, Discharge Planning  Education Outcome: Acknowledges understanding/In group clarification offered/Needs additional education.   Clinical Observations/Feedback: Pt did not attend group.    Caroll Rancher, LRT/CTRS         Caroll Rancher A 05/25/2018 11:34 AM

## 2018-05-26 MED ORDER — ONDANSETRON 4 MG PO TBDP: 4.0000 mg | ORAL_TABLET | Freq: Three times a day (TID) | ORAL | Status: DC | PRN

## 2018-05-26 MED ORDER — LOPERAMIDE HCL 2 MG PO CAPS: 4.0000 mg | ORAL_CAPSULE | ORAL | Status: DC | PRN

## 2018-05-26 NOTE — Progress Notes (Signed)
Patient denies HI and AVH but does endorse depression anxiety and suicidal ideations anxiety and depression.  Patient has been compliant with medications and attended groups.    Assess patient for safety, offer medications as prescribed, engage patient in 1:1 staff talks.   Continue to monitor as planned.  Patient able to contract for safety.

## 2018-05-26 NOTE — Progress Notes (Signed)
D: Pt passive SI-contracts for safety. deniesHI/AVH. Pt is pleasant and cooperative. Pt visible in the dayroom, but keeps to herself. Pt did talk with Clinical research associate but remained guarded A: Pt was offered support and encouragement. Pt was given scheduled medications. Pt was encourage to attend groups. Q 15 minute checks were done for safety.  R:Pt attends groups and interacts well with peers and staff. Pt is taking medication. Pt has no complaints.Pt receptive to treatment and safety maintained on unit.  Problem: Education: Goal: Emotional status will improve Outcome: Progressing   Problem: Education: Goal: Mental status will improve Outcome: Progressing   Problem: Activity: Goal: Interest or engagement in activities will improve Outcome: Progressing   Problem: Activity: Goal: Sleeping patterns will improve Outcome: Progressing   Problem: Coping: Goal: Ability to demonstrate self-control will improve Outcome: Progressing

## 2018-05-26 NOTE — Progress Notes (Signed)
Adult Psychoeducational Group Note  Date:  05/26/2018 Time:  9:20 PM  Group Topic/Focus:  Wrap-Up Group:   The focus of this group is to help patients review their daily goal of treatment and discuss progress on daily workbooks.  Participation Level:  Active  Participation Quality:  Appropriate  Affect:  Appropriate  Cognitive:  Appropriate  Insight: Appropriate  Engagement in Group:  Engaged  Modes of Intervention:  Discussion  Additional Comments:  Patient attended group and said that her day was a 5.  Her goal for today was to find new coping skills and she did.   Tonica Brasington W Okla Qazi 05/26/2018, 9:20 PM

## 2018-05-26 NOTE — [facility] Group Notes (Signed)
LCSW Group Therapy Note  05/26/2018 1:15pm  Type of Therapy/Topic:  Group Therapy:  Feelings about Diagnosis  Participation Level:  Active   Description of Group:   This group will allow patients to explore their thoughts and feelings about diagnoses they have received. Patients will be guided to explore their level of understanding and acceptance of these diagnoses. Facilitator will encourage patients to process their thoughts and feelings about the reactions of others to their diagnosis and will guide patients in identifying ways to discuss their diagnosis with significant others in their lives. This group will be process-oriented, with patients participating in exploration of their own experiences, giving and receiving support, and processing challenge from other group members.   Therapeutic Goals: 1. Patient will demonstrate understanding of diagnosis as evidenced by identifying two or more symptoms of the disorder 2. Patient will be able to express two feelings regarding the diagnosis 3. Patient will demonstrate their ability to communicate their needs through discussion and/or role play  Summary of Patient Progress:       Therapeutic Modalities:   Cognitive Behavioral Therapy Brief Therapy Feelings Identification    Ida Rogue, LCSW 05/26/2018 2:27 PM

## 2018-05-26 NOTE — Progress Notes (Signed)
Recreation Therapy Notes  Date: 10.15.19 Time: 1000 Location: 500 Hall Dayroom  Group Topic: Wellness  Goal Area(s) Addresses:  Patient will define components of whole wellness. Patient will verbalize benefit of whole wellness.  Intervention: Music, Exercise  Activity: Exercise.  LRT led patients in [name] series of stretches to get them warmed up and loose.  Each patient would then lead the group in an exercise of their choice.  The group will complete 3 to 4 rounds of exercise.  Patients could take water breaks as needed.  Education: Wellness, Discharge Planning.   Education Outcome: Acknowledges education/In group clarification offered/Needs additional education.   Clinical Observations/Feedback: Pt did not attend group.    Patricia [name], LRT/CTRS         Patricia [name] 05/26/2018 11:36 AM 
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
## 2018-05-27 NOTE — [facility] Group Notes (Signed)
LCSW Group Therapy Note   05/27/2018 1:15pm   Type of Therapy and Topic:  Group Therapy:  Positive Affirmations   Participation Level:  Minimal  Description of Group: This group addressed positive affirmation toward self and others. Patients went around the room and identified two positive things about themselves and two positive things about a peer in the room. Patients reflected on how it felt to share something positive with others, to identify positive things about themselves, and to hear positive things from others. Patients were encouraged to have a daily reflection of positive characteristics or circumstances.  Therapeutic Goals 1. Patient will verbalize two of their positive qualities 2. Patient will demonstrate empathy for others by stating two positive qualities about a peer in the group 3. Patient will verbalize their feelings when voicing positive self affirmations and when voicing positive affirmations of others 4. Patients will discuss the potential positive impact on their wellness/recovery of focusing on positive traits of self and others. Summary of Patient Progress:  "I listen to music to de-stress." Stayed the entire time, engaged throughout.  Therapeutic Modalities Cognitive Behavioral Therapy Motivational Interviewing  Ida Rogue, [place] 05/27/2018 11:12 AM

## 2018-05-27 NOTE — Progress Notes (Signed)
Patient has been isolative to room this shift.  Patient denies SI, HI, and AVH Patient has been compliant with medications and has had no behavioral dsycontrol.  Assess patient for safety, offer medications as prescribed, engage patient in 1:1 staff talks.  Patient able to contract for safety, continue to monitor as planned.
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
## 2018-05-27 NOTE — Progress Notes (Signed)
Did not attend group 

## 2018-05-27 NOTE — Progress Notes (Signed)
Recreation Therapy Notes  Date: 10.16.19 Time: 1000 Location: 500 [address] Dayroom  Group Topic: Communication  Goal Area(s) Addresses:  Patient will effectively communicate with peers in group.  Patient will verbalize benefit of healthy communication. Patient will verbalize positive effect of healthy communication on post d/c goals.   Intervention: Geometrical drawings, pencils, blank paper  Activity: Back to Back Drawings.  Patients were paired off into groups of two. Patients are seated back to back.  One person in each group was given a picture to describe to their partner.  The partner is to draw the picture as it is described to them.  The person drawing could not ask any questions of the person giving the instructions.  Once finished, partners will compare pictures and then switch roles.  LRT will then give each group a different picture.  Education: Communication, Discharge Planning  Education Outcome: Acknowledges understanding/In group clarification offered/Needs additional education.   Clinical Observations/Feedback:  Pt did not attend group.    Tramaine Sauls, LRT/CTRS         Jaskirat Zertuche A 05/27/2018 12:33 PM 

## 2018-05-28 MED ORDER — ASPIRIN-ACETAMINOPHEN-CAFFEINE 250-250-65 MG PO TABS
1.0000 | ORAL_TABLET | Freq: Three times a day (TID) | ORAL | Status: DC | PRN
  Administered 2018-05-29 – 2018-06-12 (×17): 1 via ORAL
  Filled 2018-05-28 (×17): qty 1

## 2018-05-28 MED ORDER — TOPIRAMATE 100 MG PO TABS
100.0000 mg | ORAL_TABLET | Freq: Every day | ORAL | Status: DC
  Administered 2018-05-28 – 2018-06-15 (×17): 100 mg via ORAL
  Filled 2018-05-28 (×23): qty 1
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
## 2018-05-28 NOTE — Progress Notes (Signed)
Did not attend group 

## 2018-05-28 NOTE — Progress Notes (Signed)
D: Patient denies SI, HI or AVH today. Patient presents as cautious and flat but pleasant and cooperative.  She reports that she slept well and has a good appetite.  She is isolating some to her room but is visualized in the dayroom interacting with staff and her peers.  She has attended/participated in groups and denies any physical complaints.  A: Patient given emotional support from RN. Patient encouraged to come to staff with concerns and/or questions. Patient's medication routine continued. Patient's orders and plan of care reviewed.   R: Patient remains appropriate and cooperative. Will continue to monitor patient q15 minutes for safety.

## 2018-05-28 NOTE — Progress Notes (Signed)
D: Patient observed resting in bed, sleeping soundly most of evening. Awakened easily for hs medications. Patient forwards minimal information, mostly due to sleepiness.  Patient's affect blunted, mood preoccupied but pleasant. Difficult to assess thought patterns as patient took meds and went back to sleep. Reporting back pain of a 7/10, no other physical complaints.   A: Medicated per orders, prn tylenol given for discomfort. Medication education provided. Level III obs in place for safety. Emotional support offered.  Encouraged to attend and participate in unit programming.   R: Patient verbalizes understanding of POC. On reassess, patient was asleep. Patient endorsing passive SI "earlier today" but denies HI/AVH and remains safe on level III obs. Will continue to monitor throughout the night.

## 2018-05-28 NOTE — Tx Team (Signed)
Interdisciplinary Treatment and Diagnostic Plan Update  05/28/2018 Time of Session: [place] MRN: [id number]  Principal Diagnosis: Schizoaffective disorder, bipolar type ([place])  Secondary Diagnoses: Principal Problem:   Schizoaffective disorder, bipolar type ([place])   Current Medications:  Current Facility-Administered Medications  Medication Dose Route Frequency Provider Last Rate Last Dose  . acetaminophen (TYLENOL) tablet 650 mg  650 mg Oral Q6H PRN Sharma Covert, MD   650 mg at 05/26/18 1050  . famotidine (PEPCID) tablet 20 mg  20 mg Oral Daily Sharma Covert, MD   20 mg at 05/28/18 5852  . haloperidol lactate (HALDOL) injection 2 mg  2 mg Intramuscular Q6H PRN Sharma Covert, MD      . hydrOXYzine (ATARAX/VISTARIL) tablet 25 mg  25 mg Oral Q6H PRN Sharma Covert, MD      . loperamide (IMODIUM) capsule 4 mg  4 mg Oral PRN Pennelope Bracken, MD      . LORazepam (ATIVAN) tablet 1 mg  1 mg Oral Q4H PRN Sharma Covert, MD       Or  . LORazepam (ATIVAN) injection 2 mg  2 mg Intramuscular Q4H PRN Sharma Covert, MD      . magnesium hydroxide (MILK OF MAGNESIA) suspension 15 mL  15 mL Oral QHS PRN Sharma Covert, MD      . multivitamin with minerals tablet 1 tablet  1 tablet Oral Daily Sharma Covert, MD   1 tablet at 05/28/18 [phone]  . ondansetron (ZOFRAN-ODT) disintegrating tablet 4 mg  4 mg Oral Q8H PRN Pennelope Bracken, MD      . paliperidone (INVEGA) 24 hr tablet 6 mg  6 mg Oral QHS Sharma Covert, MD   6 mg at 05/27/18 2118  . sertraline (ZOLOFT) tablet 50 mg  50 mg Oral Daily Pennelope Bracken, MD   50 mg at 05/28/18 0811  . traZODone (DESYREL) tablet 50 mg  50 mg Oral QHS PRN Sharma Covert, MD       PTA Medications: Medications Prior to Admission  Medication Sig Dispense Refill Last Dose  . acetaminophen (TYLENOL) 325 MG tablet Take 325-650 mg by mouth every 6 (six) hours as needed for mild pain,  moderate pain or headache.   Unk at [facility]  . aspirin-acetaminophen-caffeine (EXCEDRIN MIGRAINE) [phone] MG tablet Take 1 tablet by mouth every 6 (six) hours as needed for headache or migraine.   Unk at [facility]    Patient Stressors: Financial difficulties Marital or family conflict Occupational concerns  Patient Strengths: Capable of independent living Motivation for treatment/growth Supportive family/friends  Treatment Modalities: Medication Management, Group therapy, Case management,  1 to 1 session with clinician, Psychoeducation, Recreational therapy.   Physician Treatment Plan for Primary Diagnosis: Schizoaffective disorder, bipolar type ([place]) Long Term Goal(s): Improvement in symptoms so as ready for discharge Improvement in symptoms so as ready for discharge   Short Term Goals: Ability to identify changes in lifestyle to reduce recurrence of condition will improve Ability to verbalize feelings will improve Ability to disclose and discuss suicidal ideas Ability to demonstrate self-control will improve Ability to identify and develop effective coping behaviors will improve Ability to maintain clinical measurements within normal limits will improve Compliance with prescribed medications will improve Ability to identify changes in lifestyle to reduce recurrence of condition will improve Ability to verbalize feelings will improve Ability to disclose and discuss suicidal ideas Ability to demonstrate self-control will improve Ability to identify and develop effective  coping behaviors will improve Ability to maintain clinical measurements within normal limits will improve Compliance with prescribed medications will improve  Medication Management: Evaluate patient's response, side effects, and tolerance of medication regimen.  Therapeutic Interventions: 1 to 1 sessions, Unit Group sessions and Medication administration.  Evaluation of Outcomes: Not Met   10/17: "I have blurry  vision. I feel light-headed, tired all the time. I have bad headaches. I really feel sad because I can't get all my children & I don't know where they are. I'm sleeping too much at night & I nap during the day too. I think I'm depressed, not manic or Schizophrenic. My twin brother has Asperger & ADHD. Can you all at least help me find my kids. I was dating a Nature conservation officer guy for 7 years & I keep getting pregnant. The OBGYN kept on taking all the kids as I was told. I don't know how to deal with these things".  -Schizoaffective disorder, bipolar type -Continue Invega 82m po qDay -Continue zoloft 524mpo qDay.  Mood stabilization.             - Initiated Topamax 100 mg po Q bedtime.  Physician Treatment Plan for Secondary Diagnosis: Principal Problem:   Schizoaffective disorder, bipolar type ([place] Long Term Goal(s): Improvement in symptoms so as ready for discharge Improvement in symptoms so as ready for discharge   Short Term Goals: Ability to identify changes in lifestyle to reduce recurrence of condition will improve Ability to verbalize feelings will improve Ability to disclose and discuss suicidal ideas Ability to demonstrate self-control will improve Ability to identify and develop effective coping behaviors will improve Ability to maintain clinical measurements within normal limits will improve Compliance with prescribed medications will improve Ability to identify changes in lifestyle to reduce recurrence of condition will improve Ability to verbalize feelings will improve Ability to disclose and discuss suicidal ideas Ability to demonstrate self-control will improve Ability to identify and develop effective coping behaviors will improve Ability to maintain clinical measurements within normal limits will improve Compliance with prescribed medications will improve     Medication Management: Evaluate patient's response, side effects, and tolerance of  medication regimen.  Therapeutic Interventions: 1 to 1 sessions, Unit Group sessions and Medication administration.  Evaluation of Outcomes: Not Met   RN Treatment Plan for Primary Diagnosis: Schizoaffective disorder, bipolar type (HCMalcolmLong Term Goal(s): Knowledge of disease and therapeutic regimen to maintain health will improve  Short Term Goals: Ability to identify and develop effective coping behaviors will improve and Compliance with prescribed medications will improve  Medication Management: RN will administer medications as ordered by provider, will assess and evaluate patient's response and provide education to patient for prescribed medication. RN will report any adverse and/or side effects to prescribing provider.  Therapeutic Interventions: 1 on 1 counseling sessions, Psychoeducation, Medication administration, Evaluate responses to treatment, Monitor vital signs and CBGs as ordered, Perform/monitor CIWA, COWS, AIMS and Fall Risk screenings as ordered, Perform wound care treatments as ordered.  Evaluation of Outcomes: Not Met   LCSW Treatment Plan for Primary Diagnosis: Schizoaffective disorder, bipolar type (HCBreckin[facility] CenterLong Term Goal(s): Safe transition to appropriate next level of care at discharge, Engage patient in therapeutic group addressing interpersonal concerns.  Short Term Goals: Engage patient in aftercare planning with referrals and resources, Increase social support, Facilitate acceptance of mental health diagnosis and concerns and Increase skills for wellness and recovery  Therapeutic Interventions: Assess for all discharge needs, 1 to 1 time with Social  worker, Explore available resources and support systems, Assess for adequacy in community support network, Educate family and significant other(s) on suicide prevention, Complete Psychosocial Assessment, Interpersonal group therapy.  Evaluation of Outcomes: Not Met   Progress in Treatment: Attending groups:  Yes. Participating in groups: Yes. Taking medication as prescribed: Yes. Toleration medication: Yes. Family/Significant other contact made: No, will contact:  when given permission Patient understands diagnosis: No. Discussing patient identified problems/goals with staff: Yes. Medical problems stabilized or resolved: Yes. Denies suicidal/homicidal ideation: Yes. Issues/concerns per patient self-inventory: No. Other: none  New problem(s) identified: No, Describe:  none  New Short Term/Long Term Goal(s):  Patient Goals:  "address my issues that led to my depression: no good place to live, trouble getting disability, don't know where by children."  Discharge Plan or Barriers:   Reason for Continuation of Hospitalization: Delusions  Depression Hallucinations Medication stabilization  Estimated Length of Stay: 10/22  Attendees: Patient:  05/25/2018   Physician: Dr. Nancy Fetter, MD 05/25/2018   Nursing:  Almyra Brace 05/25/2018   RN Care Manager: 05/25/2018   Social Worker: Ripley Fraise, [place] 05/25/2018   Recreational Therapist:  05/25/2018   Other:  05/25/2018   Other:  05/25/2018   Other: 05/25/2018        Scribe for Treatment Team: Trish Mage, LCSW 05/28/2018 9:15 AM

## 2018-05-28 NOTE — Progress Notes (Signed)
Recreation Therapy Notes  Date: 10.17.19 Time: 1000 Location: 500 Hall Dayroom  Group Topic: Stress Management  Goal Area(s) Addresses:  Patient will verbalize importance of using healthy stress management.  Patient will identify positive emotions associated with healthy stress management.   Behavioral Response: None  Intervention: Stress Management  Activity :  Deep Breathing & Guided Imagery.  LRT introduced the stress management techniques of deep breathing and guided imagery.  LRT led patients in the proper way to practice deep breathing.  LRT then played a guided imagery that allowed patients to further relax on the beach at sunset.  Education: Stress Management, Discharge Planning.   Education Outcome: Acknowledges edcuation/In group clarification offered/Needs additional education  Clinical Observations/Feedback: Pt came in group during processing.  Pt sat and listened as her peers shared their experience with meditation.  Pt did ask about yoga and aroma therapy.      Caroll Rancher, LRT/CTRS      Lillia Abed, Leondro Coryell A 05/28/2018 11:08 AM

## 2018-05-28 NOTE — Progress Notes (Signed)
D: Pt was in bed in her room upon initial approach.  Pt presents with depressed affect and mood.  She states "I'm okay, just real tired."  Pt reports the best part of her day was "dinner."  Pt denies SI/HI, denies hallucinations, denies pain.  Pt has been isolative to her room for the majority of the evening.  She did not attend evening group.    A: Introduced self to pt.  Actively listened to pt and offered support and encouragement. Medication administered per order.  Q15 minute safety checks maintained.  R: Pt is safe on the unit.  Pt is compliant with medication.  Pt verbally contracts for safety and reports she will inform staff of needs and concerns.  Will continue to monitor and assess.

## 2018-05-28 NOTE — BHH Group Notes (Signed)
LCSW Group Therapy Note  05/28/2018 2:58 PM  Type of Therapy/Topic:  Group Therapy:  Emotion Regulation  Participation Level:  Active   Description of Group:   The purpose of this group is to assist patients in learning to regulate negative emotions and experience positive emotions. Patients will be guided to discuss ways in which they have been vulnerable to their negative emotions. These vulnerabilities will be juxtaposed with experiences of positive emotions or situations, and patients will be challenged to use positive emotions to combat negative ones. Special emphasis will be placed on coping with negative emotions in conflict situations, and patients will process healthy conflict resolution skills.  Therapeutic Goals: 1. Patient will identify two positive emotions or experiences to reflect on in order to balance out   negative emotions 2. Patient will label two or more emotions that they find the most difficult to experience 3. Patient will demonstrate positive conflict resolution skills through discussion and/or role plays  Summary of Patient Progress: Hunter described her challenging emotion as sadness.  She states that she cries and isolates, and she has not figured out what to do about this when it is happening.    Therapeutic Modalities:   Cognitive Behavioral Therapy Feelings Identification Dialectical Behavioral Therapy   [name] MSW Intern 05/28/2018 2:58 PM

## 2018-05-29 MED ORDER — OLANZAPINE 10 MG PO TABS
10.0000 mg | ORAL_TABLET | Freq: Every day | ORAL | Status: DC
  Administered 2018-05-29 – 2018-05-30 (×2): 10 mg via ORAL
  Filled 2018-05-29 (×3): qty 1

## 2018-05-29 NOTE — Progress Notes (Signed)
D: Patient observed resting in bed, asleep this evening. Patient was up for evening group however. Patient awakened for hs meds at 2120 and therefore assessment difficult to complete. Patient's affect flat, mood preoccupied. Patient's appearance bizarre, disheleved.  Denies pain, physical complaints at this time.   A: Medicated per orders, no prns needed or requested. Medication education provided. Level III obs in place for safety. Emotional support offered.  Encouraged to attend and participate in unit programming.     R: Patient verbalizes understanding of POC. Patient denies HI/AVH however endorsing some passive SI. No plan, intent and verbal contract is in place. Patient remains safe on level III obs. Will continue to monitor throughout the night.

## 2018-05-29 NOTE — B[facility] Group Notes (Signed)
Group Therapy: Chaplain-Led Processing Group  Participation Level:  Active  Participation Quality:  Attentive  Affect:  Flat  Cognitive:  Oriented  Insight:  Limited  Engagement in Therapy:  Limited  Modes of Intervention:  Discussion, Socialization  Summary of Progress/Problems:  Chaplain was here to lead a group on themes of hope and courage. She attended the entire session. Patricia [name] described courage as "hope for justice", "knowing my own story" and being able to reach out. Patricia [name] shared a picture of a duck in flight stating it was beautiful. She explained that it takes courage to see beauty in things.  [name] MSW Intern 05/29/2018 1:23 PM
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
## 2018-05-29 NOTE — Progress Notes (Signed)
Pt did attend group, and actively participated. 

## 2018-05-29 NOTE — Plan of Care (Signed)
  Problem: Coping: Goal: Ability to demonstrate self-control will improve Outcome: Progressing   Problem: Safety: Goal: Periods of time without injury will increase Outcome: Progressing   

## 2018-05-29 NOTE — Progress Notes (Signed)
Recreation Therapy Notes  Date: 10.18.19 Time: 1000 Location: 500 Hall Dayroom  Group Topic: Communication, Team Building, Problem Solving  Goal Area(s) Addresses:  Patient will effectively work with peer towards shared goal.  Patient will identify skill used to make activity successful.  Patient will identify how skills used during activity can be used to reach post d/c goals.   Behavioral Response: Engaged  Intervention: STEM Activity   Activity: Glass blower/designer. In teams, patients were asked to build the tallest freestanding tower possible out of 15 pipe cleaners. Systematically resources were removed, for example patient ability to use both hands and patient ability to verbally communicate.    Education: Pharmacist, community, Building control surveyor.   Education Outcome: Acknowledges education/In group clarification offered/Needs additional education.   Clinical Observations/Feedback:  Pt was bright and worked well with her peers.  Pt expressed the skills from the activity could be used with her support system by "taking something from each person to come up with a plan".  Pt was appropriate and able to focus on activity.    Caroll Rancher, LRT/CTRS

## 2018-05-29 NOTE — Progress Notes (Signed)
Pt presents with a flat affect and depressed mood. Pt rated on her self inventory sheet: depression 7/10, anxiety 2, and hopelessness 4.5. Pt expressed passive SI with no plan or intent. Pt verbally contracts for safety. Pt thoughts are noted to be disorganized and speech tangential. Pt visible on the unit and attends scheduled groups. Pt compliant with taking meds and denies any side effects.  Medications reviewed with pt. Medications administered as ordered per MD. Verbal support provided. Pt encouraged to attend groups. 15 minute checks performed for safety. Suicide risk assessment completed.  Pt compliant with tx plan. No concerns verbalized by pt.

## 2018-05-29 NOTE — BHH Group Notes (Signed)
Adult Psychoeducational Group Note  Date:  05/29/2018 Time:  9:08 PM  Group Topic/Focus:  Wrap-Up Group:   The focus of this group is to help patients review their daily goal of treatment and discuss progress on daily workbooks.  Participation Level:  Active  Participation Quality:  Appropriate and Attentive  Affect:  Appropriate  Cognitive:  Alert  Insight: Appropriate and Good  Engagement in Group:  Engaged  Modes of Intervention:  Discussion and Education  Additional Comments:  Pt attended and participated in wrap up group this evening. Pt rated their day a 7.5/10, due to them having areally good day. Pt completed their goal, which was to elevate their mood. Going to groups and going outside helped to elevate their mood.   [name] 05/29/2018, 9:08 PM

## 2018-05-30 NOTE — Progress Notes (Signed)
DAR Note: Pt A & O to self and place. Denies SI, HI, AVH and pain when assessed. Presents with blunted affect, preoccupied but animated and forwards during interactions. Visible in scheduled groups  and was engaged when prompted. Compliant with medications when offered. Denies drug reactions when assessed.  Scheduled medications given per order. Emotional support and encouragement provided to pt. Safety checks continues at Q 15 minutes intervals without self harm gestures.   Pt receptive to care. Tolerates all PO intake well. Remains safe on and off unit.
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
## 2018-05-30 NOTE — Progress Notes (Signed)
Did not attend. 

## 2018-05-30 NOTE — Progress Notes (Signed)
D: Pt was in bed in her room upon initial approach.  Pt presents with depressed affect and mood.  She describes her day as "all right" and states "I don't remember" when asked what her goal is.  Pt denies SI/HI, denies hallucinations, denies pain.  Pt has been isolative to her room for the majority of the evening and she did not attend evening group.   A: Introduced self to pt.  Met with pt 1:1.  Actively listened to pt and offered support and encouragement. Medications administered per order.  Q15 minute safety checks maintained.  R: Pt is safe on the unit.  Pt is compliant with medications.  Pt verbally contracts for safety.  Will continue to monitor and assess.

## 2018-05-30 NOTE — [facility] Group Notes (Signed)
  [facility] LCSW Group Therapy Note  Date/Time:  05/30/2018 11:15AM-12:00PM  Type of Therapy and Topic:  Group Therapy:  Feelings About Hospitalization  Participation Level:  Active   Description of Group This process group involved patients discussing their feelings related to being hospitalized, as well as the benefits they see to being in the hospital.  These feelings and benefits were itemized.  The group then brainstormed specific ways in which they could seek those same benefits when they discharge and return home.  Therapeutic Goals 1. Patient will identify and describe positive and negative feelings related to hospitalization 2. Patient will verbalize benefits of hospitalization to themselves personally 3. Patients will brainstorm together ways they can obtain similar benefits in the outpatient setting, identify barriers to wellness and possible solutions  Summary of Patient Progress:  The patient expressed her primary feelings about being hospitalized are "mixed" because she is here due to being in a low place in her life and needed to be here.  However, she is not thrilled with all aspects of treatment.  After group pt approached CSW and reported that she has a variety of PhD's and would like to use her sign language skills in the [facility] to help the babies communicate, would like CSW to refer her there for some volunteer work.   She was not told this was not possible in order to not upset her, but instead was told that the information would be passed in a handoff to her weekday social worker.  Therapeutic Modalities Cognitive Behavioral Therapy Motivational Interviewing    Ambrose Mantle, LCSW 05/30/2018, 8:42 AM

## 2018-05-31 MED ORDER — OLANZAPINE 7.5 MG PO TABS
15.0000 mg | ORAL_TABLET | Freq: Every day | ORAL | Status: DC
  Administered 2018-05-31 – 2018-06-15 (×14): 15 mg via ORAL
  Filled 2018-05-31 (×2): qty 2
  Filled 2018-05-31: qty 6
  Filled 2018-05-31 (×16): qty 2

## 2018-05-31 NOTE — Progress Notes (Signed)
D: Pt was in hallway upon initial approach.  Pt presents with appropriate affect and depressed mood.  She reports her day was "good."  She denies having a goal so Probation officer and pt made goal for pt to "be safe and sleep well."  Pt denies SI/HI, denies hallucinations, reports pain from headache of 7/10.  Pt has been more visible in milieu tonight.  Pt attended evening group.  She had a verbal confrontation with peer.  Staff redirected both pts and pt responded well to redirection.  A: Introduced self to pt.  Met with pt 1:1.  Actively listened to pt and offered support and encouragement. Medications administered per order.  PRN medication administered for headache.  Q15 minute safety checks maintained.  R: Pt is safe on the unit.  Pt is compliant with medications.  Pt verbally contracts for safety.  Will continue to monitor and assess.

## 2018-05-31 NOTE — Progress Notes (Signed)
Patient ID: Patricia [name], female   DOB: April 01, 1971, 47 y.o.   MRN: [id number]    D: Pt has been withdrawn on the unit today, she did not interact much with staff or peers. Pt did attend all groups with minimal participation. Pt refused to fill out her pt self inventory sheet. Pt reported that she was not depressed, not hopeless, and was not having any anxiety. Pt reported being negative SI/HI, no AH/VH noted. A: 15 min checks continued for patient safety. R: Pt safety maintained.
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
## 2018-05-31 NOTE — Progress Notes (Signed)
Adult Psychoeducational Group Note  Date:  05/31/2018 Time:  8:31 PM  Group Topic/Focus:  Wrap-Up Group:   The focus of this group is to help patients review their daily goal of treatment and discuss progress on daily workbooks.  Participation Level:  Active  Participation Quality:  Appropriate  Affect:  Appropriate  Cognitive:  Appropriate  Insight: Appropriate  Engagement in Group:  Engaged  Modes of Intervention:  Discussion  Additional Comments:  The patient expressed that she rates today a 9.The patient also said that she attended group and preparing for discharge.  Octavio Manns 05/31/2018, 8:31 PM

## 2018-05-31 NOTE — B[facility] Group Notes (Signed)
[facility] LCSW Group Therapy Note  Date/Time:  05/31/2018  11:00AM-12:00PM  Type of Therapy and Topic:  Group Therapy:  Music and Mood  Participation Level:  Active   Description of Group: In this process group, members listened to a variety of genres of music and identified that different types of music evoke different responses.  Patients were encouraged to identify music that was soothing for them and music that was energizing for them.  Patients discussed how this knowledge can help with wellness and recovery in various ways including managing depression and anxiety as well as encouraging healthy sleep habits.    Therapeutic Goals: 1. Patients will explore the impact of different varieties of music on mood 2. Patients will verbalize the thoughts they have when listening to different types of music 3. Patients will identify music that is soothing to them as well as music that is energizing to them 4. Patients will discuss how to use this knowledge to assist in maintaining wellness and recovery 5. Patients will explore the use of music as a coping skill  Summary of Patient Progress:  At the beginning of group, patient expressed that she felt "groggy" at about a 5-6 on a 1-10 scale.  Every time a song played she did not appear to like, or someone said something she did not appear to agree with, she would roll her eyes in an exaggerated fashion as though irritated.  However, she smiled when called on directly and said she felt "soothed" at the end of group.  Therapeutic Modalities: Solution Focused Brief Therapy Activity   Ambrose Mantle, LCSW
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
## 2018-06-01 NOTE — [facility] Group Notes (Signed)
[facility] LCSW Group Therapy Note  Date/Time: 06/01/18, 1315  Type of Therapy and Topic:  Group Therapy:  Overcoming Obstacles  Participation Level:  none  Description of Group:    In this group patients will be encouraged to explore what they see as obstacles to their own wellness and recovery. They will be guided to discuss their thoughts, feelings, and behaviors related to these obstacles. The group will process together ways to cope with barriers, with attention given to specific choices patients can make. Each patient will be challenged to identify changes they are motivated to make in order to overcome their obstacles. This group will be process-oriented, with patients participating in exploration of their own experiences as well as giving and receiving support and challenge from other group members.  Therapeutic Goals: 1. Patient will identify personal and current obstacles as they relate to admission. 2. Patient will identify barriers that currently interfere with their wellness or overcoming obstacles.  3. Patient will identify feelings, thought process and behaviors related to these barriers. 4. Patient will identify two changes they are willing to make to overcome these obstacles:    Summary of Patient Progress: Pt came to group late and then left shortly afterwards without participating.      Therapeutic Modalities:   Cognitive Behavioral Therapy Solution Focused Therapy Motivational Interviewing Relapse Prevention Therapy  Daleen Squibb, LCSW

## 2018-06-01 NOTE — Tx Team (Signed)
Interdisciplinary Treatment and Diagnostic Plan Update  06/01/2018 Time of Session: 0840  Patricia [name] MRN: [id number]  Principal Diagnosis: Schizoaffective disorder, bipolar type [facility])  Secondary Diagnoses: Principal Problem:   Schizoaffective disorder, bipolar type (HCC) Active Problems:   Paranoid schizophrenia (HCC)   Current Medications:  Current Facility-Administered Medications  Medication Dose Route Frequency Provider Last Rate Last Dose  . acetaminophen (TYLENOL) tablet 650 mg  650 mg Oral Q6H PRN Antonieta Pert, MD   650 mg at 05/28/18 2124  . aspirin-acetaminophen-caffeine (EXCEDRIN MIGRAINE) per tablet 1 tablet  1 tablet Oral Q8H PRN Armandina Stammer I, NP   1 tablet at 06/01/18 6295  . famotidine (PEPCID) tablet 20 mg  20 mg Oral Daily Antonieta Pert, MD   20 mg at 06/01/18 2841  . haloperidol lactate (HALDOL) injection 2 mg  2 mg Intramuscular Q6H PRN Antonieta Pert, MD      . hydrOXYzine (ATARAX/VISTARIL) tablet 25 mg  25 mg Oral Q6H PRN Antonieta Pert, MD      . loperamide (IMODIUM) capsule 4 mg  4 mg Oral PRN Micheal Likens, MD      . LORazepam (ATIVAN) tablet 1 mg  1 mg Oral Q4H PRN Antonieta Pert, MD       Or  . LORazepam (ATIVAN) injection 2 mg  2 mg Intramuscular Q4H PRN Antonieta Pert, MD      . magnesium hydroxide (MILK OF MAGNESIA) suspension 15 mL  15 mL Oral QHS PRN Antonieta Pert, MD      . multivitamin with minerals tablet 1 tablet  1 tablet Oral Daily Antonieta Pert, MD   1 tablet at 06/01/18 0817  . OLANZapine (ZYPREXA) tablet 15 mg  15 mg Oral QHS Antonieta Pert, MD   15 mg at 05/31/18 2111  . ondansetron (ZOFRAN-ODT) disintegrating tablet 4 mg  4 mg Oral Q8H PRN Micheal Likens, MD      . sertraline (ZOLOFT) tablet 50 mg  50 mg Oral Daily Micheal Likens, MD   50 mg at 06/01/18 0817  . topiramate (TOPAMAX) tablet 100 mg  100 mg Oral QHS Armandina Stammer I, NP   100 mg at 05/31/18 2111   . traZODone (DESYREL) tablet 50 mg  50 mg Oral QHS PRN Antonieta Pert, MD       PTA Medications: Medications Prior to Admission  Medication Sig Dispense Refill Last Dose  . acetaminophen (TYLENOL) 325 MG tablet Take 325-650 mg by mouth every 6 (six) hours as needed for mild pain, moderate pain or headache.   Unk at [facility]  . aspirin-acetaminophen-caffeine (EXCEDRIN MIGRAINE) [phone] MG tablet Take 1 tablet by mouth every 6 (six) hours as needed for headache or migraine.   Unk at [facility]    Patient Stressors: Financial difficulties Marital or family conflict Occupational concerns  Patient Strengths: Capable of independent living Motivation for treatment/growth Supportive family/friends  Treatment Modalities: Medication Management, Group therapy, Case management,  1 to 1 session with clinician, Psychoeducation, Recreational therapy.   Physician Treatment Plan for Primary Diagnosis: Schizoaffective disorder, bipolar type (HCC) Long Term Goal(s): Improvement in symptoms so as ready for discharge Improvement in symptoms so as ready for discharge   Short Term Goals: Ability to identify changes in lifestyle to reduce recurrence of condition will improve Ability to verbalize feelings will improve Ability to disclose and discuss suicidal ideas Ability to demonstrate self-control will improve Ability to identify and develop effective coping behaviors will  improve Ability to maintain clinical measurements within normal limits will improve Compliance with prescribed medications will improve Ability to identify changes in lifestyle to reduce recurrence of condition will improve Ability to verbalize feelings will improve Ability to disclose and discuss suicidal ideas Ability to demonstrate self-control will improve Ability to identify and develop effective coping behaviors will improve Ability to maintain clinical measurements within normal limits will improve Compliance with prescribed  medications will improve  Medication Management: Evaluate patient's response, side effects, and tolerance of medication regimen.  Therapeutic Interventions: 1 to 1 sessions, Unit Group sessions and Medication administration.  Evaluation of Outcomes: Progressing   10/17: "I have blurry vision. I feel light-headed, tired all the time. I have bad headaches. I really feel sad because I can't get all my children & I don't know where they are. I'm sleeping too much at night & I nap during the day too. I think I'm depressed, not manic or Schizophrenic. My twin brother has Asperger & ADHD. Can you all at least help me find my kids. 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 The OBGYN kept on taking all the kids as I was told. I don't know how to deal with these things".  -Schizoaffective disorder, bipolar type -Continue Invega 6mg  po qDay -Continue zoloft 50mg  po qDay.  Mood stabilization.             - Initiated Topamax 100 mg po Q bedtime.  Physician Treatment Plan for Secondary Diagnosis: Principal Problem:   Schizoaffective disorder, bipolar type (HCC) Active Problems:   Paranoid schizophrenia (HCC)  Long Term Goal(s): Improvement in symptoms so as ready for discharge Improvement in symptoms so as ready for discharge   Short Term Goals: Ability to identify changes in lifestyle to reduce recurrence of condition will improve Ability to verbalize feelings will improve Ability to disclose and discuss suicidal ideas Ability to demonstrate self-control will improve Ability to identify and develop effective coping behaviors will improve Ability to maintain clinical measurements within normal limits will improve Compliance with prescribed medications will improve Ability to identify changes in lifestyle to reduce recurrence of condition will improve Ability to verbalize feelings will improve Ability to disclose and discuss suicidal  ideas Ability to demonstrate self-control will improve Ability to identify and develop effective coping behaviors will improve Ability to maintain clinical measurements within normal limits will improve Compliance with prescribed medications will improve     Medication Management: Evaluate patient's response, side effects, and tolerance of medication regimen.  Therapeutic Interventions: 1 to 1 sessions, Unit Group sessions and Medication administration.  Evaluation of Outcomes: Progressing   RN Treatment Plan for Primary Diagnosis: Schizoaffective disorder, bipolar type (HCC) Long Term Goal(s): Knowledge of disease and therapeutic regimen to maintain health will improve  Short Term Goals: Ability to identify and develop effective coping behaviors will improve and Compliance with prescribed medications will improve  Medication Management: RN will administer medications as ordered by provider, will assess and evaluate patient's response and provide education to patient for prescribed medication. RN will report any adverse and/or side effects to prescribing provider.  Therapeutic Interventions: 1 on 1 counseling sessions, Psychoeducation, Medication administration, Evaluate responses to treatment, Monitor vital signs and CBGs as ordered, Perform/monitor CIWA, COWS, AIMS and Fall Risk screenings as ordered, Perform wound care treatments as ordered.  Evaluation of Outcomes: Progressing   LCSW Treatment Plan for Primary Diagnosis: Schizoaffective disorder, bipolar type (HCC) Long Term Goal(s): Safe transition to appropriate  next level of care at discharge, Engage patient in therapeutic group addressing interpersonal concerns.  Short Term Goals: Engage patient in aftercare planning with referrals and resources, Increase social support, Facilitate acceptance of mental health diagnosis and concerns and Increase skills for wellness and recovery  Therapeutic Interventions: Assess for all discharge  needs, 1 to 1 time with Social worker, Explore available resources and support systems, Assess for adequacy in community support network, Educate family and significant other(s) on suicide prevention, Complete Psychosocial Assessment, Interpersonal group therapy.  Evaluation of Outcomes: Progressing   Progress in Treatment: Attending groups: Yes. Participating in groups: Yes. Taking medication as prescribed: Yes. Toleration medication: Yes. Family/Significant other contact made: No, will contact:  Pt declined consent Patient understands diagnosis: No. Discussing patient identified problems/goals with staff: Yes. Medical problems stabilized or resolved: Yes. Denies suicidal/homicidal ideation: Yes. Issues/concerns per patient self-inventory: No. Other: none  New problem(s) identified: No, Describe:  none  New Short Term/Long Term Goal(s):  Patient Goals:  "address my issues that led to my depression: no good place to live, trouble getting disability, don't know where by children."  Discharge Plan or Barriers:   Reason for Continuation of Hospitalization: Delusions  Depression Hallucinations Medication stabilization  Estimated Length of Stay: 2-4 days.  Attendees: Patient: 06/01/2018   Physician: Dr. Altamese Kobuk, MD 06/01/2018   Nursing:  06/01/2018   RN Care Manager: 06/01/2018   Social Worker: Daleen Squibb, LCSW 06/01/2018   Recreational Therapist:  06/01/2018   Other:  06/01/2018   Other:  06/01/2018   Other: 06/01/2018            Scribe for Treatment Team: Lorri Frederick, LCSW 06/01/2018 11:31 AM

## 2018-06-01 NOTE — Plan of Care (Addendum)
Progress Note  D: pt found in the hallway; compliant with medication administration. Pt states she had a headache that she rates a 6.5/10. Pt denied any si/hi/ah/vh and verbally agrees to approach staff if these become apparent or before harming herself while at [facility]. Pt described having si "1 time" on her patient inventory. Pt rates her depression/hopelessness/anxiety a 7/5/2 out of 10 respectively. Pt states she slept well.  A: pt provided support and encouragement. Pt given medications per protocol and standing orders. Q26m safety checks implemented and continued.  R: pt safe on the unit. Will continue to monitor.   Pt progressing in the following metrics  Problem: Coping: Goal: Ability to verbalize frustrations and anger appropriately will improve Outcome: Progressing Goal: Ability to demonstrate self-control will improve Outcome: Progressing   Problem: Health Behavior/Discharge Planning: Goal: Compliance with treatment plan for underlying cause of condition will improve Outcome: Progressing   Problem: Physical Regulation: Goal: Ability to maintain clinical measurements within normal limits will improve Outcome: Progressing   Problem: Safety: Goal: Periods of time without injury will increase Outcome: Progressing   Problem: Education: Goal: Knowledge of the prescribed therapeutic regimen will improve Outcome: Progressing

## 2018-06-01 NOTE — Progress Notes (Signed)
Recreation Therapy Notes  Date: 10.21.19 Time: 1000 Location: 500 Hall Dayroom  Group Topic: Coping Skills  Goal Area(s) Addresses:  Patient will be able to identify positive coping skills. Patient will be able to identify benefit of using coping skills post d/c.  Intervention: Worksheet, pencils, white board, marker, eraser  Activity: Mind map.  LRT and patients filled in the first 8 boxes together with things (anger, death/loss, break up, family, anxiety, depression, fear and illness) that would require the use of coping skills.  Education: Pharmacologist, Building control surveyor.   Education Outcome: Acknowledges understanding/In group clarification offered/Needs additional education.   Clinical Observations/Feedback: Pt did not attend group.     Kittie Krizan Sammuel Bailiff, LRT/CTRS         Caroll Rancher A 06/01/2018 12:03 PM
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
## 2018-06-02 NOTE — Progress Notes (Signed)
DAR NOTE: Patient presents with anxious affect and depressed mood.  Pt has been visible in the day interacting with peers and staff. Pt stated she is been having a good day with good mood. Complained of headache, but denied HI/SI, AVH and contracted for safety. Rates depression at 6, hopelessness at 4, and anxiety at 1.  Maintained on routine safety checks.  Medications given as prescribed.  Support and encouragement offered as needed.  Attended group and participated.  Will continue to monitor.

## 2018-06-02 NOTE — [facility] Group Notes (Signed)
[facility] LCSW Group Therapy Note  Date/Time: 06/02/18, 1100  Type of Therapy/Topic:  Group Therapy:  Feelings about Diagnosis  Participation Level:  Active   Mood:pleasant   Description of Group:    This group will allow patients to explore their thoughts and feelings about diagnoses they have received. Patients will be guided to explore their level of understanding and acceptance of these diagnoses. Facilitator will encourage patients to process their thoughts and feelings about the reactions of others to their diagnosis, and will guide patients in identifying ways to discuss their diagnosis with significant others in their lives. This group will be process-oriented, with patients participating in exploration of their own experiences as well as giving and receiving support and challenge from other group members.   Therapeutic Goals: 1. Patient will demonstrate understanding of diagnosis as evidence by identifying two or more symptoms of the disorder:  2. Patient will be able to express two feelings regarding the diagnosis 3. Patient will demonstrate ability to communicate their needs through discussion and/or role plays  Summary of Patient Progress: Pt active participant during group today regarding diagnosis, acceptance of diagnosis, and stigma.  Pt shared that she does accept her diagnosis and is trying to work with her MD so that she can manage it successfully.        Therapeutic Modalities:   Cognitive Behavioral Therapy Brief Therapy Feelings Identification   Daleen Squibb, LCSW

## 2018-06-02 NOTE — Progress Notes (Signed)
Recreation Therapy Notes  Date: 10.22.19 Time: 1000 Location: 500 Hall Dayroom  Group Topic: Wellness  Goal Area(s) Addresses:  Patient will define components of whole wellness. Patient will verbalize benefit of whole wellness.  Behavioral Response: Engaged  Intervention: Music  Activity: Exercise.  LRT lead patients in a series of stretches to get them warmed up.  Each patient got the opportunity to lead the group in an exercise. The group was to complete 30 minutes of exercises to get the blood flowing and heart rate up.  Patients were given a water break half way through the workout but could stop at any point if they needed water or needed a break.  Education: Wellness, Building control surveyor.   Education Outcome: Acknowledges education/In group clarification offered/Needs additional education.   Clinical Observations/Feedback:  Pt was engaged and completed the majority of the exercises.  Pt did leave at one point but returned.  Pt was appropriate and stated that she could do some meditation after the exercises.     Caroll Rancher, LRT/CTRS     Lillia Abed, Delvonte Berenson A 06/02/2018 11:19 AM

## 2018-06-03 DIAGNOSIS — R110 Nausea: Secondary | ICD-10-CM

## 2018-06-03 NOTE — Progress Notes (Signed)
Recreation Therapy Notes  Date: 10.23.19 Time: 1000 Location: 500 Hall Dayroom  Group Topic: Triggers  Goal Area(s) Addresses:  Patient will identify the things that trigger them.  Patient will identify strategies to avoid/reduce exposure to triggers. Patient will identify strategies to deal with each trigger head on.  Behavioral Response: Engaged  Intervention: Worksheet, pencils  Activity: Triggers.  LRT introduced triggers to patients.  Patients were to identify their triggers, how they avoid their triggers and how they deal with their triggers head on.  Education: Triggers, Discharge Planning  Education Outcome: Acknowledges understanding/In group clarification offered/Needs additional education.   Clinical Observations/Feedback:  Pt stated her triggers were "someone trying to fight me, someone lying about me and posers".  Pt expressed she avoids them by "comfronting them, stay away from hostile people and stay away from posers".  Pt explained she deals with her triggers head on by talking to the person calmly.      Caroll Rancher, LRT/CTRS     Lillia Abed, Audy Dauphine A 06/03/2018 11:28 AM

## 2018-06-03 NOTE — Plan of Care (Signed)
Progress Note  D: pt found in her room; compliant with medication administration. Pt states she slept well. Pt rates her depression/hopelessness/anxiety a 6.5/5/4 out of 10 respectively. Pt has complaints of cravings, agitation, chills, nausea, irritability, and acid reflux. Pt states she is also suffering from a headache that she rates at a 7/10, which is causing lightheadedness and dizziness. Pt states she would love to have aromatherapy as her goal but failed to state how she should achieve this. Pt denies any si/hi/ah/vh and verbally agrees to approach staff if these become apparent or before harming herself while at [facility]. A: pt provided support and encouragement. Pt given medication per protocol and standing orders. Q50m safety checks implemented and continued.  R: pt safe on the unit. Will continue to monitor.   Pt progressing in the following metrics  Problem: Safety: Goal: Ability to disclose and discuss suicidal ideas will improve Outcome: Progressing   Problem: Self-Concept: Goal: Level of anxiety will decrease Outcome: Progressing   Problem: Coping: Goal: Coping ability will improve Outcome: Progressing   Problem: Medication: Goal: Compliance with prescribed medication regimen will improve Outcome: Progressing   Problem: Self-Concept: Goal: Ability to disclose and discuss suicidal ideas will improve Outcome: Progressing Goal: Will verbalize positive feelings about self Outcome: Progressing   Problem: Education: Goal: Knowledge of the prescribed therapeutic regimen will improve Outcome: Progressing   Problem: Health Behavior/Discharge Planning: Goal: Compliance with prescribed medication regimen will improve Outcome: Progressing

## 2018-06-03 NOTE — Progress Notes (Signed)
Pt was observed in the dayroom, seen talking on the phone. Pt appears animated/restless in affect and mood. Pt denies SI/HI/AVH at this time. Pt c/o of ongoing migraine; Rates pain 5/10. Encourage to push fluids. PRN Excedrin requested and given. Support and encouragement provided.Will continue with POC.

## 2018-06-03 NOTE — Progress Notes (Signed)
Did not attend group 

## 2018-06-03 NOTE — [facility] Group Notes (Signed)
[facility] Group Notes:  (Nursing/MHT/Case Management/Adjunct)  Date:  06/03/2018  Time:  4:00 PM  Type of Therapy:  Nurse Education  Participation Level:  Active  Participation Quality:  Appropriate and Attentive  Affect:  Appropriate  Cognitive:  Alert and Appropriate  Insight:  Appropriate  Engagement in Group:  Engaged  Modes of Intervention:  Discussion and Education  Summary of Progress/Problems: pt's discussed personal develop from admission til now. Pt's also discussed planning for discharge and beyond.   Suszanne Conners Abbie Jablon 06/03/2018, 4:41 PM

## 2018-06-03 NOTE — Progress Notes (Signed)
Adult Psychoeducational Group Note  Date:  06/03/2018 Time:  3:24 AM  Group Topic/Focus:  Wrap-Up Group:   The focus of this group is to help patients review their daily goal of treatment and discuss progress on daily workbooks.  Participation Level:  Active  Participation Quality:  Appropriate  Affect:  Appropriate  Cognitive:  Appropriate  Insight: Good  Engagement in Group:  Engaged  Modes of Intervention:  Discussion  Additional Comments:  Pt stated her goal for today was to focus on her treatment plan. Pt stated she accomplished her goal today. Pt rated her over all day a 7 out of 10. Pt stated she attend all groups held today. Pt stated her phone call to her family improved her day.  Felipa Furnace 06/03/2018, 3:24 AM

## 2018-06-03 NOTE — Progress Notes (Signed)
Patient ID: Patricia [name], female   DOB: 12-11-1970, 47 y.o.   MRN: [id number] D: Pt calm and cooperative with care, was visible in the day room earlier in shift interacting with staff and peers, denies SI/HI/AVH. Pt is currently in bed observed to be sleeping with no signs of distress,  A: Pt given all meds as scheduled, and is being maintained on Q15 minute checks for safety.    R: Will continue to monitor on Q15 minute checks.
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
## 2018-06-03 NOTE — BHH Group Notes (Signed)
LCSW Group Therapy Note  06/03/2018 9:16 AM  Type of Therapy/Topic:  Group Therapy:  Emotion Regulation  Participation Level:  Active   Description of Group:   The purpose of this group is to assist patients in learning to regulate negative emotions and experience positive emotions. Patients will be guided to discuss ways in which they have been vulnerable to their negative emotions. These vulnerabilities will be juxtaposed with experiences of positive emotions or situations, and patients will be challenged to use positive emotions to combat negative ones. Special emphasis will be placed on coping with negative emotions in conflict situations, and patients will process healthy conflict resolution skills.  Therapeutic Goals: 1. Patient will identify two positive emotions or experiences to reflect on in order to balance out negative emotions 2. Patient will label two or more emotions that they find the most difficult to experience 3. Patient will demonstrate positive conflict resolution skills through discussion and/or role plays  Summary of Patient Progress: Patricia [name] attended the entire session. Patricia [name] affect was congruent and appropriate with mild grandiosity. Patricia [name] participated with cues. She named negative and positive traits of self-exteem as "bad grades" and "graduating". Continued to show delusional thought content.       Therapeutic Modalities:   Cognitive Behavioral Therapy Feelings Identification Dialectical Behavioral Therapy  [name] MSW Intern 06/03/2018 9:16 AM

## 2018-06-04 NOTE — Progress Notes (Deleted)
Recreation Therapy Notes  Date: 10.24.19 Time: 1000 Location: [address] Dayroom  Group Topic: Leisure Education  Goal Area(s) Addresses:  Patient will identify positive leisure activities.  Patient will identify one positive benefit of participation in leisure activities.   Behavioral Response: Engaged  Intervention: Magazines, scissors, Holiday representative paper, glue sticks  Activity: Leisure Heritage manager.  Patients were to create a leisure collage of activities they can do to relax or to engage with family/friends.  Education:  Leisure Education, Building control surveyor  Education Outcome: Acknowledges education/In group clarification offered/Needs additional education  Clinical Observations/Feedback: Pt was bright and laughing during group.  Pt expressed some of the leisure activities she likes do are play with her cats and dogs, go out to eat "so someone else can cook for me", dancing, shopping and go on a cruise to [place].    Caroll Rancher, LRT/CTRS     Caroll Rancher A 06/04/2018 11:43 AM

## 2018-06-04 NOTE — Progress Notes (Signed)
Pt was observed in the room, seen resting in bed with eyes open. Pt appears flat in affect and mood. Pt is bizarre in dress; guarded with interaction.Pt denies SI/HI/AVH at this time. Pt c/o of ongoing migraine; Rates pain 7/10. Encourage to push fluids. PRN Excedrin requested and given. Support and encouragement provided.Will continue with POC.
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
## 2018-06-04 NOTE — Progress Notes (Signed)
Recreation Therapy Notes  Date: 10.24.19 Time: 1000 Location: [address] Dayroom  Group Topic: Leisure Education  Goal Area(s) Addresses:  Patient will identify positive leisure activities.  Patient will identify one positive benefit of participation in leisure activities.   Behavioral Response: Engaged  Intervention: Magazines, scissors, Holiday representative paper, glue sticks  Activity: Leisure Heritage manager.  Patients were to create a leisure collage of activities they can do to relax or to engage with family/friends.  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    Caroll Rancher, LRT/CTRS      Caroll Rancher A 06/04/2018 11:58 AM

## 2018-06-04 NOTE — Progress Notes (Signed)
Adult Psychoeducational Group Note  Date:  06/04/2018 Time:  9:02 PM   Group Topic/Focus:  Wrap-Up Group:   The focus of this group is to help patients review their daily goal of treatment and discuss progress on daily workbooks.  Participation Level:  Did Not Attend  Participation Quality:  Did not attend  Affect:  Did not attend  Cognitive:  Did not attend  Insight: None  Engagement in Group:  Did not attend  Modes of Intervention:  Did not attend  Additional Comments:  Patient did not attend wrap up group tonight.   Kahmya Pinkham L Mikle Sternberg 06/04/2018, 9:02 PM

## 2018-06-04 NOTE — Plan of Care (Signed)
  Problem: Activity: Goal: Interest or engagement in activities will improve Outcome: Progressing   Problem: Safety: Goal: Periods of time without injury will increase Outcome: Progressing  DAR NOTE: Patient presents with anxious affect and depressed mood.  Denies suicidal thoughts, auditory and visual hallucinations.  Rates depression at 7, hopelessness at 0, and anxiety at 6.  Maintained on routine safety checks.  Medications given as prescribed.  Support and encouragement offered as needed.  Attended group and participated.    Patient observed socializing with peers in the dayroom.

## 2018-06-04 NOTE — [facility] Group Notes (Signed)
[facility] LCSW Group Therapy Note  Date/Time: 06/04/18, 1315  Type of Therapy/Topic:  Group Therapy:  Balance in Life  Participation Level:  moderate  Description of Group:    This group will address the concept of balance and how it feels and looks when one is unbalanced. Patients will be encouraged to process areas in their lives that are out of balance, and identify reasons for remaining unbalanced. Facilitators will guide patients utilizing problem- solving interventions to address and correct the stressor making their life unbalanced. Understanding and applying boundaries will be explored and addressed for obtaining  and maintaining a balanced life. Patients will be encouraged to explore ways to assertively make their unbalanced needs known to significant others in their lives, using other group members and facilitator for support and feedback.  Therapeutic Goals: 1. Patient will identify two or more emotions or situations they have that consume much of in their lives. 2. Patient will identify signs/triggers that life has become out of balance:  3. Patient will identify two ways to set boundaries in order to achieve balance in their lives:  4. Patient will demonstrate ability to communicate their needs through discussion and/or role plays  Summary of Patient Progress:Pt shared that finances and work are areas that a currently out of balance.  Pt made several comments during group discussion and was attentive throughout.          Therapeutic Modalities:   Cognitive Behavioral Therapy Solution-Focused Therapy Assertiveness Training  Daleen Squibb, [place]

## 2018-06-05 MED ORDER — INFLUENZA VAC SPLIT QUAD 0.5 ML IM SUSY
0.5000 mL | PREFILLED_SYRINGE | INTRAMUSCULAR | Status: DC
  Filled 2018-06-05: qty 0.5
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
## 2018-06-05 NOTE — Tx Team (Signed)
Interdisciplinary Treatment and Diagnostic Plan Update  06/05/2018 Time of Session: 0845 Patricia [name] MRN: [id number]  Principal Diagnosis: Schizoaffective disorder, bipolar type [facility])  Secondary Diagnoses: Principal Problem:   Schizoaffective disorder, bipolar type (HCC)   Current Medications:  Current Facility-Administered Medications  Medication Dose Route Frequency Provider Last Rate Last Dose  . acetaminophen (TYLENOL) tablet 650 mg  650 mg Oral Q6H PRN Antonieta Pert, MD   650 mg at 06/01/18 2117  . aspirin-acetaminophen-caffeine (EXCEDRIN MIGRAINE) per tablet 1 tablet  1 tablet Oral Q8H PRN Armandina Stammer I, NP   1 tablet at 06/05/18 1054  . famotidine (PEPCID) tablet 20 mg  20 mg Oral Daily Antonieta Pert, MD   20 mg at 06/05/18 4098  . haloperidol lactate (HALDOL) injection 2 mg  2 mg Intramuscular Q6H PRN Antonieta Pert, MD      . hydrOXYzine (ATARAX/VISTARIL) tablet 25 mg  25 mg Oral Q6H PRN Antonieta Pert, MD      . Melene Muller ON 06/06/2018] Influenza vac split quadrivalent PF (FLUARIX) injection 0.5 mL  0.5 mL Intramuscular Tomorrow-1000 Jolyne Loa T, MD      . loperamide (IMODIUM) capsule 4 mg  4 mg Oral PRN Micheal Likens, MD      . LORazepam (ATIVAN) tablet 1 mg  1 mg Oral Q4H PRN Antonieta Pert, MD       Or  . LORazepam (ATIVAN) injection 2 mg  2 mg Intramuscular Q4H PRN Antonieta Pert, MD      . magnesium hydroxide (MILK OF MAGNESIA) suspension 15 mL  15 mL Oral QHS PRN Antonieta Pert, MD      . multivitamin with minerals tablet 1 tablet  1 tablet Oral Daily Antonieta Pert, MD   1 tablet at 06/05/18 0804  . OLANZapine (ZYPREXA) tablet 15 mg  15 mg Oral QHS Antonieta Pert, MD   15 mg at 06/04/18 2111  . ondansetron (ZOFRAN-ODT) disintegrating tablet 4 mg  4 mg Oral Q8H PRN Micheal Likens, MD      . sertraline (ZOLOFT) tablet 50 mg  50 mg Oral Daily Micheal Likens, MD   50 mg at 06/05/18  0804  . topiramate (TOPAMAX) tablet 100 mg  100 mg Oral QHS Armandina Stammer I, NP   100 mg at 06/04/18 2111  . traZODone (DESYREL) tablet 50 mg  50 mg Oral QHS PRN Antonieta Pert, MD       PTA Medications: Medications Prior to Admission  Medication Sig Dispense Refill Last Dose  . acetaminophen (TYLENOL) 325 MG tablet Take 325-650 mg by mouth every 6 (six) hours as needed for mild pain, moderate pain or headache.   Unk at [facility]  . aspirin-acetaminophen-caffeine (EXCEDRIN MIGRAINE) [phone] MG tablet Take 1 tablet by mouth every 6 (six) hours as needed for headache or migraine.   Unk at [facility]    Patient Stressors: Financial difficulties Marital or family conflict Occupational concerns  Patient Strengths: Capable of independent living Motivation for treatment/growth Supportive family/friends  Treatment Modalities: Medication Management, Group therapy, Case management,  1 to 1 session with clinician, Psychoeducation, Recreational therapy.   Physician Treatment Plan for Primary Diagnosis: Schizoaffective disorder, bipolar type (HCC) Long Term Goal(s): Improvement in symptoms so as ready for discharge Improvement in symptoms so as ready for discharge   Short Term Goals: Ability to identify changes in lifestyle to reduce recurrence of condition will improve Ability to verbalize feelings will improve Ability to disclose  and discuss suicidal ideas Ability to demonstrate self-control will improve Ability to identify and develop effective coping behaviors will improve Ability to maintain clinical measurements within normal limits will improve Compliance with prescribed medications will improve Ability to identify changes in lifestyle to reduce recurrence of condition will improve Ability to verbalize feelings will improve Ability to disclose and discuss suicidal ideas Ability to demonstrate self-control will improve Ability to identify and develop effective coping behaviors will  improve Ability to maintain clinical measurements within normal limits will improve Compliance with prescribed medications will improve  Medication Management: Evaluate patient's response, side effects, and tolerance of medication regimen.  Therapeutic Interventions: 1 to 1 sessions, Unit Group sessions and Medication administration.  Evaluation of Outcomes: Not Progressing   10/17: "I have blurry vision. I feel light-headed, tired all the time. I have bad headaches. I really feel sad because I can't get all my children & I don't know where they are. I'm sleeping too much at night & I nap during the day too. I think I'm depressed, not manic or Schizophrenic. My twin brother has Asperger & ADHD. Can you all at least help me find my kids. 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 The OBGYN kept on taking all the kids as I was told. I don't know how to deal with these things".  -Schizoaffective disorder, bipolar type -Continue Invega 6mg  po qDay -Continue zoloft 50mg  po qDay.  Mood stabilization.             - Initiated Topamax 100 mg po Q bedtime.  Physician Treatment Plan for Secondary Diagnosis: Principal Problem:   Schizoaffective disorder, bipolar type (HCC)  Long Term Goal(s): Improvement in symptoms so as ready for discharge Improvement in symptoms so as ready for discharge   Short Term Goals: Ability to identify changes in lifestyle to reduce recurrence of condition will improve Ability to verbalize feelings will improve Ability to disclose and discuss suicidal ideas Ability to demonstrate self-control will improve Ability to identify and develop effective coping behaviors will improve Ability to maintain clinical measurements within normal limits will improve Compliance with prescribed medications will improve Ability to identify changes in lifestyle to reduce recurrence of condition will improve Ability to verbalize feelings  will improve Ability to disclose and discuss suicidal ideas Ability to demonstrate self-control will improve Ability to identify and develop effective coping behaviors will improve Ability to maintain clinical measurements within normal limits will improve Compliance with prescribed medications will improve     Medication Management: Evaluate patient's response, side effects, and tolerance of medication regimen.  Therapeutic Interventions: 1 to 1 sessions, Unit Group sessions and Medication administration.  Evaluation of Outcomes: Not Progressing   RN Treatment Plan for Primary Diagnosis: Schizoaffective disorder, bipolar type (HCC) Long Term Goal(s): Knowledge of disease and therapeutic regimen to maintain health will improve  Short Term Goals: Ability to identify and develop effective coping behaviors will improve and Compliance with prescribed medications will improve  Medication Management: RN will administer medications as ordered by provider, will assess and evaluate patient's response and provide education to patient for prescribed medication. RN will report any adverse and/or side effects to prescribing provider.  Therapeutic Interventions: 1 on 1 counseling sessions, Psychoeducation, Medication administration, Evaluate responses to treatment, Monitor vital signs and CBGs as ordered, Perform/monitor CIWA, COWS, AIMS and Fall Risk screenings as ordered, Perform wound care treatments as ordered.  Evaluation of Outcomes: Progressing   LCSW Treatment Plan for  Primary Diagnosis: Schizoaffective disorder, bipolar type (HCC) Long Term Goal(s): Safe transition to appropriate next level of care at discharge, Engage patient in therapeutic group addressing interpersonal concerns.  Short Term Goals: Engage patient in aftercare planning with referrals and resources, Increase social support, Facilitate acceptance of mental health diagnosis and concerns and Increase skills for wellness and  recovery  Therapeutic Interventions: Assess for all discharge needs, 1 to 1 time with Social worker, Explore available resources and support systems, Assess for adequacy in community support network, Educate family and significant other(s) on suicide prevention, Complete Psychosocial Assessment, Interpersonal group therapy.  Evaluation of Outcomes: Not Progressing   Progress in Treatment: Attending groups: Yes. Participating in groups: Yes. Taking medication as prescribed: Yes. Toleration medication: Yes. Family/Significant other contact made: No, will contact:  Pt declined consent Patient understands diagnosis: No. Discussing patient identified problems/goals with staff: Yes. Medical problems stabilized or resolved: Yes. Denies suicidal/homicidal ideation: Yes. Issues/concerns per patient self-inventory: No. Other: none  New problem(s) identified: No, Describe:  none  New Short Term/Long Term Goal(s):  Patient Goals:  "address my issues that led to my depression: no good place to live, trouble getting disability, don't know where by children."  Discharge Plan or Barriers:   Reason for Continuation of Hospitalization: Delusions  Depression Hallucinations Medication stabilization  Estimated Length of Stay: 3-5  days.  Attendees: Patient: 06/05/2018   Physician: Dr. Altamese [place], MD 06/05/2018   Nursing: Roddie Mc, RN 06/05/2018   RN Care Manager: 06/05/2018   Social Worker: Daleen Squibb, LCSW 06/05/2018   Recreational Therapist:  06/05/2018   Other:  06/05/2018   Other:  06/05/2018   Other: 06/05/2018             Scribe for Treatment Team: Lorri Frederick, LCSW 06/05/2018 11:51 AM

## 2018-06-05 NOTE — Progress Notes (Signed)
Recreation Therapy Notes  Date: 10.25.19 Time: 1000 Location: [address] Dayroom  Group Topic: Communication, Team Building, Problem Solving  Goal Area(s) Addresses:  Patient will effectively work with peer towards shared goal.  Patient will identify skill used to make activity successful.  Patient will identify how skills used during activity can be used to reach post d/c goals.   Behavioral Response: Engaged  Intervention: STEM Activity   Activity: In team's, using 10 red plastic cups, patients were asked to build a pyramid.    Education: Pharmacist, community, Building control surveyor.   Education Outcome: Acknowledges education/In group clarification offered/Needs additional education.   Clinical Observations/Feedback: Pt was active and worked well with peers.  Pt left to meet with NP but returned and continued to assist the group.  Pt expressed the skills from the activity reminded her of a saying from girl scouts.  Pt was pleasant and able to focus on activity.    Caroll Rancher, LRT/CTRS     Lillia Abed, Ranger Petrich A 06/05/2018 10:59 AM

## 2018-06-05 NOTE — Progress Notes (Signed)
Adult Psychoeducational Group Note  Date:  06/05/2018 Time:  8:34 PM  Group Topic/Focus:  Wrap-Up Group:   The focus of this group is to help patients review their daily goal of treatment and discuss progress on daily workbooks.  Participation Level:  Active  Participation Quality:  Appropriate  Affect:  Appropriate  Cognitive:  Appropriate  Insight: Appropriate  Engagement in Group:  Engaged  Modes of Intervention:  Discussion  Additional Comments:  Patient attended wrap up group and said that her day was a 7.  The coping skills she used today were coloring and socializing.   Aubri Gathright W Sachiko Methot 06/05/2018, 8:34 PM

## 2018-06-06 NOTE — Plan of Care (Signed)
  Problem: Safety: Goal: Periods of time without injury will increase Outcome: Progressing   Problem: Activity: Goal: Interest or engagement in leisure activities will improve Outcome: Progressing   Problem: Coping: Goal: Coping ability will improve Outcome: Progressing

## 2018-06-06 NOTE — Progress Notes (Signed)
D:  Harriet has bee isolative to her room this evening.  She did attend evening wrap up group.  She was pleasant and cooperative.  She did complain that her toenails were peeling and she is worried about them having a fungus.  Encouraged her to show to the doctor tomorrow.  She denied SI/HI or A/V hallucinations.  At hs, she declined her medications because she was having stomach pain and didn't want it to get worse.  She reported that she gets this pain a lot and just wanted to go to sleep.  Encouraged her to come up and get the medications if she changed her mind.  She is currently resting with her eyes closed and appears to be asleep. A:  1:1 with RN for support and encouragement.  Medications as ordered.  Q 15 minute checks maintained for safety.  Encouraged participation in group and unit activities.   R:  Yasenia remains safe on the unit.  We will continue to monitor the progress towards her goals.

## 2018-06-06 NOTE — Progress Notes (Signed)
D:  Chanon was in the day room all evening.  She reported going to groups today and that she had a good day.  She denied SI/HI or A/V hallucinations.  She denied any pain this evening and appeared to be in no physical distress.  She took her hs medications without difficulty this evening.  She is currently resting with her eyes closed and appears to be asleep. A:  1:1 with RN for support and encouragement.  Medications as ordered.  Q 15 minute checks maintained for safety.  Encouraged participation in group and unit activities.   R:  Datra remains safe on the unit.  We will continue to monitor the progress towards her goals.
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
## 2018-06-06 NOTE — Plan of Care (Signed)
  Problem: Activity: Goal: Sleeping patterns will improve Outcome: Completed/Met Note:  She has been sleeping well since being here at the hospital.

## 2018-06-06 NOTE — Progress Notes (Signed)
D. Pt in bed for much of the morning- up for meds -complained of a headache and went back to bed, staying in bed and slept until lunch. Per pt's self inventory, pt rates her depression, hopelessness and anxiety a 6.5, 3.5, 5.5, respectively . Pt currently denies SI/HI  A. Labs and vitals monitored. Pt compliant with medications. Pt supported emotionally and encouraged to express concerns and ask questions.   R. Pt remains safe with 15 minute checks. Will continue POC.

## 2018-06-06 NOTE — [facility] Group Notes (Signed)
  [facility] LCSW Group Therapy Note  Date/Time:  06/06/2018 11:15AM-12:00PM  Type of Therapy and Topic:  Group Therapy:  Feelings About Hospitalization  Participation Level:  Minimal   Description of Group This process group involved patients discussing their feelings related to being hospitalized, as well as the benefits they see to being in the hospital.  These feelings and benefits were itemized.  The group then brainstormed specific ways in which they could seek those same benefits when they discharge and return home.  Therapeutic Goals 1. Patient will identify and describe positive and negative feelings related to hospitalization 2. Patient will verbalize benefits of hospitalization to themselves personally 3. Patients will brainstorm together ways they can obtain similar benefits in the outpatient setting, identify barriers to wellness and possible solutions  Summary of Patient Progress:  The patient expressed her primary feelings about being hospitalized are "safe" but she also expressed having anxiety and said she would like [place] to do aromatherapy.  Therapeutic Modalities Cognitive Behavioral Therapy Motivational Interviewing    Ambrose Mantle, LCSW 06/06/2018, 1:09 PM

## 2018-06-07 MED ORDER — NICOTINE POLACRILEX 2 MG MT GUM: 2.0000 mg | CHEWING_GUM | OROMUCOSAL | Status: DC | PRN

## 2018-06-07 NOTE — Plan of Care (Signed)
  Problem: Activity: Goal: Interest or engagement in activities will improve Outcome: Progressing Note:  Patricia [name] was out of her room more this evening, we will continue to encourage participation in group and unit activities.

## 2018-06-07 NOTE — [facility] Group Notes (Signed)
[facility] LCSW Group Therapy Note  Date/Time:  06/07/2018  11:00AM-12:00PM  Type of Therapy and Topic:  Group Therapy:  Music and Mood  Participation Level:  Active   Description of Group: In this process group, members listened to a variety of genres of music and identified that different types of music evoke different responses.  Patients were encouraged to identify music that was soothing for them and music that was energizing for them.  Patients discussed how this knowledge can help with wellness and recovery in various ways including managing depression and anxiety as well as encouraging healthy sleep habits.    Therapeutic Goals: 1. Patients will explore the impact of different varieties of music on mood 2. Patients will verbalize the thoughts they have when listening to different types of music 3. Patients will identify music that is soothing to them as well as music that is energizing to them 4. Patients will discuss how to use this knowledge to assist in maintaining wellness and recovery 5. Patients will explore the use of music as a coping skill  Summary of Patient Progress:  At the beginning of group, patient expressed that she felt sad and at the end of group felt better.  She had visible affect changes with different songs.  Therapeutic Modalities: Solution Focused Brief Therapy Activity   Ambrose Mantle, LCSW

## 2018-06-07 NOTE — [facility] Group Notes (Signed)
[facility] Group Notes:  (Nursing)  Date:  06/07/2018  Time: 4 00 PM Type of Therapy:  Nurse Education  Participation Level:  Active  Participation Quality:  Appropriate  Affect:  Appropriate  Cognitive:  Alert  Insight:  Appropriate  Engagement in Group:  Engaged  Modes of Intervention:  Discussion and Education  Summary of Progress/Problems: Nurse led relaxation group  Shela Nevin 06/07/2018, 6:51 PM

## 2018-06-07 NOTE — Plan of Care (Signed)
  Problem: Education: Goal: Knowledge of [facility] Education information/materials will improve Outcome: Progressing   Problem: Activity: Goal: Interest or engagement in activities will improve Outcome: Progressing   Problem: Safety: Goal: Periods of time without injury will increase Outcome: Progressing  Patient oriented to the unit. Patient will spend time in the dayroom. Patient remains safe and will continue to monitor.

## 2018-06-07 NOTE — Progress Notes (Signed)
D. Pt observed interacting appropriately with staff and peers in the dayroom- friendly during interactions. Pt remains delusional at times, but has been calm and cooperative -no  behavior issues noted.Per pt's self inventory, pt rates her depression, hopelessness and anxiety a 6/4/4, respectively. Pt currently denies SI/HI and AVH A. Labs and vitals monitored. Pt compliant with medications. Pt supported emotionally and encouraged to express concerns and ask questions.   R. Pt remains safe with 15 minute checks. Will continue POC.
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
## 2018-06-07 NOTE — [facility] Group Notes (Signed)
[facility] Group Notes:  (Nursing/MHT/Case Management/Adjunct)  Date:  06/07/2018  Time:  9:00 - 10:00 am  Type of Therapy:  Psychoeducational Skills and Goals Group  Participation Level:  Active  Participation Quality:  Appropriate  Affect:  Appropriate  Cognitive:  Appropriate  Insight:  Appropriate  Engagement in Group:  Engaged  Modes of Intervention:  Discussion and Education   Summary of Progress/Problems:  Patient participated in group appropriately , was alert and supportive.    Earline Mayotte 06/07/2018, 10:36 AM

## 2018-06-08 MED ORDER — PNEUMOCOCCAL VAC POLYVALENT 25 MCG/0.5ML IJ INJ
0.5000 mL | INJECTION | INTRAMUSCULAR | Status: AC
  Administered 2018-06-09: 0.5 mL via INTRAMUSCULAR

## 2018-06-08 NOTE — Progress Notes (Signed)
Recreation Therapy Notes  Date: 10.28.19 Time: 1000 Location: [address] Dayroom  Group Topic:  Goal Setting  Goal Area(s) Addresses:  Patient will be able to identify at least 3 life goals.   Patient will be able to identify benefit of investing in life goals.  Patient will be able to identify benefit of setting life goals.   Behavioral Response:  Engaged  Intervention: Worksheet, pencils  Activity: Life Goals.  Patients were to identify goals they wanted to accomplish in a wee, month, year and five years.  Patients were to then identify the obstacles they would face, what they needed in order to be successful and what they could start doing to work towards their goals.  Education:  Discharge Planning, Coping Skills, Life Goals   Education Outcome: Acknowledges Education/In Group Clarification Provided/Needs Additional Education  Clinical Observations: Pt expressed her goal for the next week was to get a new apartment; go to school in a year and get a degree in the next five years. Pt identified her obstacles as money; what she needs to be successful is make money and what she could do now to help reach her goals is to get a job.    Caroll Rancher, LRT/CTRS      Lillia Abed, Alamin Mccuiston A 06/08/2018 10:39 AM

## 2018-06-08 NOTE — Progress Notes (Signed)
D: Patient is alert, pleasant, and cooperative. Patient presents with a fixed smile, anxious affect, brief eye contact, and cautious/minimal interaction. Patient denies SI, HI, AVH, and verbally contracts for safety. Patient requested medication for a headache.   A: Scheduled medications administered per MD order. PRN headache medication administered per MD order. Support provided. Patient educated on safety on the unit and medications. Routine safety checks every 15 minutes. Patient stated understanding to tell nurse about any new physical symptoms. Patient understands to tell staff of any needs.     R: No adverse drug reactions noted. Patient verbally contracts for safety. Patient remains safe at this time and will continue to monitor.

## 2018-06-08 NOTE — [facility] Group Notes (Signed)
[facility] LCSW Group Therapy Note  Date/Time:06/08/18, 1315  Type of Therapy and Topic:  Group Therapy:  Overcoming Obstacles  Participation Level:  minimal  Description of Group:    In this group patients will be encouraged to explore what they see as obstacles to their own wellness and recovery. They will be guided to discuss their thoughts, feelings, and behaviors related to these obstacles. The group will process together ways to cope with barriers, with attention given to specific choices patients can make. Each patient will be challenged to identify changes they are motivated to make in order to overcome their obstacles. This group will be process-oriented, with patients participating in exploration of their own experiences as well as giving and receiving support and challenge from other group members.  Therapeutic Goals: 1. Patient will identify personal and current obstacles as they relate to admission. 2. Patient will identify barriers that currently interfere with their wellness or overcoming obstacles.  3. Patient will identify feelings, thought process and behaviors related to these barriers. 4. Patient will identify two changes they are willing to make to overcome these obstacles:    Summary of Patient Progress: Pt mostly quiet during group.  When asked by CSW she identified family and money as her current obstacles.  Pt was attentive.      Therapeutic Modalities:   Cognitive Behavioral Therapy Solution Focused Therapy Motivational Interviewing Relapse Prevention Therapy  Daleen Squibb, LCSW
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
## 2018-06-08 NOTE — Plan of Care (Signed)
  Problem: Activity: Goal: Interest or engagement in activities will improve Outcome: Progressing   Problem: Coping: Goal: Ability to verbalize frustrations and anger appropriately will improve Outcome: Progressing Goal: Ability to demonstrate self-control will improve Outcome: Progressing   D: Pt alert and oriented on the unit. Pt denies SI/HI, A/VH. Pt also participated during unit groups and activities. Pt rated her depression as a 5, hopelessness a 3, and anxiety a 0, all on a 0 to 10 scale. Pt is pleasant and cooperative.  A: Education, support and encouragement provided, q15 minute safety checks remain in effect. Medications administered per MD orders. R: No reactions/side effects to medicine noted. Pt denies any concerns at this time, and verbally contracts for safety. Pt ambulating on the unit with no issues. Pt remains safe on and off the unit.

## 2018-06-08 NOTE — Progress Notes (Addendum)
CSW met with pt to discuss discharge plan.  Pt gave CSW two names of relatives: Odette Horns, [phone], and Nadara Mustard and Arlys John, uncle and aunt.  [phone].  Pt said the lease at her apartment is expired in early November and she does not want to go back due to bed bugs anyways.  She thinks she could stay with one of the above people.   Winferd Humphrey, MSW, LCSW Clinical Social Worker 06/08/2018 3:54 PM   CSW spoke with Odette Horns.  He did not know who Vincente Liberty was.   CSW left message for Nadara Mustard and Arlys John. Winferd Humphrey, MSW, LCSW Clinical Social Worker 06/08/2018 3:58 PM

## 2018-06-09 NOTE — [facility] Group Notes (Signed)
[facility] LCSW Group Therapy Note  Date/Time: 06/09/18, 1100  Type of Therapy/Topic:  Group Therapy:  Balance in Life  Participation Level:  minimal  Description of Group:    This group will address the concept of balance and how it feels and looks when one is unbalanced. Patients will be encouraged to process areas in their lives that are out of balance, and identify reasons for remaining unbalanced. Facilitators will guide patients utilizing problem- solving interventions to address and correct the stressor making their life unbalanced. Understanding and applying boundaries will be explored and addressed for obtaining  and maintaining a balanced life. Patients will be encouraged to explore ways to assertively make their unbalanced needs known to significant others in their lives, using other group members and facilitator for support and feedback.  Therapeutic Goals: 1. Patient will identify two or more emotions or situations they have that consume much of in their lives. 2. Patient will identify signs/triggers that life has become out of balance:  3. Patient will identify two ways to set boundaries in order to achieve balance in their lives:  4. Patient will demonstrate ability to communicate their needs through discussion and/or role plays  Summary of Patient Progress:Pt mostly quiet during group.  Did state that she agrees with her current diagnosis when CSW asked her about his.  Pt pleasant but at one point asked CSW to repeat a question "because I wasn't paying attention."          Therapeutic Modalities:   Cognitive Behavioral Therapy Solution-Focused Therapy Assertiveness Training  Daleen Squibb, LCSW

## 2018-06-09 NOTE — Progress Notes (Signed)
Nursing Progress Note: 7p-7a D: Pt currently presents with a anxious/pleasant affect and behavior. Pt states "I am having a good day. I really want to go home. I'm going to live with my family." Interacting appropriately with the milieu. Pt reports good sleep during the previous night with current medication regimen. Pt did attend wrap-up group.  A: Pt provided with medications per providers orders. Pt's labs and vitals were monitored throughout the night. Pt supported emotionally and encouraged to express concerns and questions. Pt educated on medications.  R: Pt's safety ensured with 15 minute and environmental checks. Pt currently denies SI, HI, and AVH. Pt verbally contracts to seek staff if SI,HI, or AVH occurs and to consult with staff before acting on any harmful thoughts. Will continue to monitor.

## 2018-06-09 NOTE — Progress Notes (Signed)
D: Patient has been pleasant with staff.  She requested a pneumonia shot and it was administered.  Patient has been compliant with her medications, and her behavior has been appropriate.  She denies any thoughts of self harm.  She complained of a headache earlier, and she was given excedrin with good results.  She is sleeping and eating well.  She rates her depression as a 5; anxiety as a 4; hopelessness as a 3.  Discharge plan is ongoing.    A: Continue to monitor medication management and MD orders.  Safety checks completed every 15 minutes per protocol.  Offer support and encouragement as needed.  R: Patient is receptive to staff; her behavior is appropriate.

## 2018-06-09 NOTE — Progress Notes (Signed)
Recreation Therapy Notes  Date: 10.29.19 Time: 1000 Location: 500 [address] Dayroom  Group Topic: Leisure Education  Goal Area(s) Addresses:  Patient will identify positive leisure activities.  Patient will identify one positive benefit of participation in leisure activities.   Behavioral Response: Engaged  Intervention: [employer], dry erase marker, eraser, various leisure activities  Activity: Pictionary. Patients were given the opportunity to pull a leisure activity from the container.  Patients were to then draw the activity on the board.  The remaining patients were to guess what the activity was.  The person that correctly guesses the activity gets the next turn.   Education:  Leisure Education, Building control surveyor  Education Outcome: Acknowledges education/In group clarification offered/Needs additional education  Clinical Observations/Feedback:  Pt arrived late to group.  Pt engaged in group with prompting.  Once pt became engaged, she stayed active throughout the group.  Pt was bright and interacted well with her peers.   Caroll Rancher, LRT/CTRS         Caroll Rancher A 06/09/2018 12:23 PM
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
## 2018-06-10 NOTE — B[facility] Group Notes (Signed)
LCSW Group Therapy Note  06/10/2018 4:45 PM  Type of Therapy/Topic:  Group Therapy:  Emotion Regulation  Participation Level:  Active   Description of Group:   The purpose of this group is to assist patients in learning to regulate negative emotions and experience positive emotions. Patients will be guided to discuss ways in which they have been vulnerable to their negative emotions. These vulnerabilities will be juxtaposed with experiences of positive emotions or situations, and patients will be challenged to use positive emotions to combat negative ones. Special emphasis will be placed on coping with negative emotions in conflict situations, and patients will process healthy conflict resolution skills.  Therapeutic Goals: 1. Patient will identify two positive emotions or experiences to reflect on in order to balance out   negative emotions 2. Patient will label two or more emotions that they find the most difficult to experience 3. Patient will demonstrate positive conflict resolution skills through discussion and/or role plays  Summary of Patient Progress: Patricia [name] attended the entire session. Her affect was appropriate and congruent. Patricia [name] was engaged in the yoga activity. She stated that "it was good".    Therapeutic Modalities:   Cognitive Behavioral Therapy Feelings Identification Dialectical Behavioral Therapy   [name] MSW Intern 06/10/2018 4:45 PM

## 2018-06-10 NOTE — Progress Notes (Signed)
CSW made several calls over last few days to identified family members:  Silver Huguenin, [phone].  Rey said he is 47 years old and did not recognize pt name.  Does not think she is a relative. Dimas Aguas and Lars Masson, uncle/aunt, [phone].  CSW called and left two messages but did not receive any return calls. Garner Nash, MSW, LCSW Clinical Social Worker 06/10/2018 11:12 AM

## 2018-06-10 NOTE — Tx Team (Signed)
Interdisciplinary Treatment and Diagnostic Plan Update  06/10/2018 Time of Session: 0845 Patricia [name] MRN: [id number]  Principal Diagnosis: Schizoaffective disorder, bipolar type [facility])  Secondary Diagnoses: Principal Problem:   Schizoaffective disorder, bipolar type (HCC)   Current Medications:  Current Facility-Administered Medications  Medication Dose Route Frequency Provider Last Rate Last Dose  . acetaminophen (TYLENOL) tablet 650 mg  650 mg Oral Q6H PRN Antonieta Pert, MD   650 mg at 06/01/18 2117  . aspirin-acetaminophen-caffeine (EXCEDRIN MIGRAINE) per tablet 1 tablet  1 tablet Oral Q8H PRN Armandina Stammer I, NP   1 tablet at 06/09/18 2126  . famotidine (PEPCID) tablet 20 mg  20 mg Oral Daily Antonieta Pert, MD   20 mg at 06/10/18 7829  . haloperidol lactate (HALDOL) injection 2 mg  2 mg Intramuscular Q6H PRN Antonieta Pert, MD      . hydrOXYzine (ATARAX/VISTARIL) tablet 25 mg  25 mg Oral Q6H PRN Antonieta Pert, MD      . loperamide (IMODIUM) capsule 4 mg  4 mg Oral PRN Micheal Likens, MD      . LORazepam (ATIVAN) tablet 1 mg  1 mg Oral Q4H PRN Antonieta Pert, MD       Or  . LORazepam (ATIVAN) injection 2 mg  2 mg Intramuscular Q4H PRN Antonieta Pert, MD      . magnesium hydroxide (MILK OF MAGNESIA) suspension 15 mL  15 mL Oral QHS PRN Antonieta Pert, MD      . multivitamin with minerals tablet 1 tablet  1 tablet Oral Daily Antonieta Pert, MD   1 tablet at 06/10/18 0735  . OLANZapine (ZYPREXA) tablet 15 mg  15 mg Oral QHS Antonieta Pert, MD   15 mg at 06/09/18 2127  . ondansetron (ZOFRAN-ODT) disintegrating tablet 4 mg  4 mg Oral Q8H PRN Micheal Likens, MD      . sertraline (ZOLOFT) tablet 50 mg  50 mg Oral Daily Micheal Likens, MD   50 mg at 06/10/18 0735  . topiramate (TOPAMAX) tablet 100 mg  100 mg Oral QHS Armandina Stammer I, NP   100 mg at 06/09/18 2127  . traZODone (DESYREL) tablet 50 mg  50 mg Oral QHS  PRN Antonieta Pert, MD       PTA Medications: Medications Prior to Admission  Medication Sig Dispense Refill Last Dose  . acetaminophen (TYLENOL) 325 MG tablet Take 325-650 mg by mouth every 6 (six) hours as needed for mild pain, moderate pain or headache.   Unk at [facility]  . aspirin-acetaminophen-caffeine (EXCEDRIN MIGRAINE) [phone] MG tablet Take 1 tablet by mouth every 6 (six) hours as needed for headache or migraine.   Unk at [facility]    Patient Stressors: Financial difficulties Marital or family conflict Occupational concerns  Patient Strengths: Capable of independent living Motivation for treatment/growth Supportive family/friends  Treatment Modalities: Medication Management, Group therapy, Case management,  1 to 1 session with clinician, Psychoeducation, Recreational therapy.   Physician Treatment Plan for Primary Diagnosis: Schizoaffective disorder, bipolar type (HCC) Long Term Goal(s): Improvement in symptoms so as ready for discharge Improvement in symptoms so as ready for discharge   Short Term Goals: Ability to identify changes in lifestyle to reduce recurrence of condition will improve Ability to verbalize feelings will improve Ability to disclose and discuss suicidal ideas Ability to demonstrate self-control will improve Ability to identify and develop effective coping behaviors will improve Ability to maintain clinical measurements within normal  limits will improve Compliance with prescribed medications will improve Ability to identify changes in lifestyle to reduce recurrence of condition will improve Ability to verbalize feelings will improve Ability to disclose and discuss suicidal ideas Ability to demonstrate self-control will improve Ability to identify and develop effective coping behaviors will improve Ability to maintain clinical measurements within normal limits will improve Compliance with prescribed medications will improve  Medication Management:  Evaluate patient's response, side effects, and tolerance of medication regimen.  Therapeutic Interventions: 1 to 1 sessions, Unit Group sessions and Medication administration.  Evaluation of Outcomes: Progressing   10/17: "I have blurry vision. I feel light-headed, tired all the time. I have bad headaches. I really feel sad because I can't get all my children & I don't know where they are. I'm sleeping too much at night & I nap during the day too. I think I'm depressed, not manic or Schizophrenic. My twin brother has Asperger & ADHD. Can you all at least help me find my kids. 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 The OBGYN kept on taking all the kids as I was told. I don't know how to deal with these things".  -Schizoaffective disorder, bipolar type -Continue Invega 6mg  po qDay -Continue zoloft 50mg  po qDay.  Mood stabilization.             - Initiated Topamax 100 mg po Q bedtime.  Physician Treatment Plan for Secondary Diagnosis: Principal Problem:   Schizoaffective disorder, bipolar type (HCC)  Long Term Goal(s): Improvement in symptoms so as ready for discharge Improvement in symptoms so as ready for discharge   Short Term Goals: Ability to identify changes in lifestyle to reduce recurrence of condition will improve Ability to verbalize feelings will improve Ability to disclose and discuss suicidal ideas Ability to demonstrate self-control will improve Ability to identify and develop effective coping behaviors will improve Ability to maintain clinical measurements within normal limits will improve Compliance with prescribed medications will improve Ability to identify changes in lifestyle to reduce recurrence of condition will improve Ability to verbalize feelings will improve Ability to disclose and discuss suicidal ideas Ability to demonstrate self-control will improve Ability to identify and develop effective coping  behaviors will improve Ability to maintain clinical measurements within normal limits will improve Compliance with prescribed medications will improve     Medication Management: Evaluate patient's response, side effects, and tolerance of medication regimen.  Therapeutic Interventions: 1 to 1 sessions, Unit Group sessions and Medication administration.  Evaluation of Outcomes: Progressing   RN Treatment Plan for Primary Diagnosis: Schizoaffective disorder, bipolar type (HCC) Long Term Goal(s): Knowledge of disease and therapeutic regimen to maintain health will improve  Short Term Goals: Ability to identify and develop effective coping behaviors will improve and Compliance with prescribed medications will improve  Medication Management: RN will administer medications as ordered by provider, will assess and evaluate patient's response and provide education to patient for prescribed medication. RN will report any adverse and/or side effects to prescribing provider.  Therapeutic Interventions: 1 on 1 counseling sessions, Psychoeducation, Medication administration, Evaluate responses to treatment, Monitor vital signs and CBGs as ordered, Perform/monitor CIWA, COWS, AIMS and Fall Risk screenings as ordered, Perform wound care treatments as ordered.  Evaluation of Outcomes: Progressing   LCSW Treatment Plan for Primary Diagnosis: Schizoaffective disorder, bipolar type (HCC) Long Term Goal(s): Safe transition to appropriate next level of care at discharge, Engage patient in therapeutic group addressing interpersonal concerns.  Short Term Goals: Engage patient in aftercare planning with referrals and resources, Increase social support, Facilitate acceptance of mental health diagnosis and concerns and Increase skills for wellness and recovery  Therapeutic Interventions: Assess for all discharge needs, 1 to 1 time with Social worker, Explore available resources and support systems, Assess for  adequacy in community support network, Educate family and significant other(s) on suicide prevention, Complete Psychosocial Assessment, Interpersonal group therapy.  Evaluation of Outcomes: Progressing   Progress in Treatment: Attending groups: Yes. Participating in groups: Yes. Taking medication as prescribed: Yes. Toleration medication: Yes. Family/Significant other contact made: No, will contact:  Pt declined consent Patient understands diagnosis: No. Discussing patient identified problems/goals with staff: Yes. Medical problems stabilized or resolved: Yes. Denies suicidal/homicidal ideation: Yes. Issues/concerns per patient self-inventory: No. Other: none  New problem(s) identified: No, Describe:  none  New Short Term/Long Term Goal(s):  Patient Goals:  "address my issues that led to my depression: no good place to live, trouble getting disability, don't know where by children."  Discharge Plan or Barriers:   Reason for Continuation of Hospitalization: Delusions  Depression Hallucinations Medication stabilization  Estimated Length of Stay: 1  Day.  Attendees: Patient: 06/10/2018   Physician: Dr Altamese Bandera, MD 06/10/2018   Nursing: Dossie Arbour, RN 06/10/2018   RN Care Manager: 06/10/2018   Social Worker: Daleen Squibb, LCSW 06/10/2018   Recreational Therapist:  06/10/2018   Other:  06/10/2018   Other:  06/10/2018   Other: 06/10/2018                Scribe for Treatment Team: Lorri Frederick, LCSW 06/10/2018 4:08 PM

## 2018-06-10 NOTE — Progress Notes (Signed)
Pt met with Tahleal Garrett from [facility] to discuss services after discharge.  Pt hesitant to agree, worried that she will be made to live somewhere she doesn't want to go.  After much discussion, pt agreed to think about it.  Tahleal to check back tomorrow.  , MSW, LCSW Clinical Social Worker 06/10/2018 4:07 PM 
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
## 2018-06-10 NOTE — Plan of Care (Signed)
  Problem: Education: Goal: Mental status will improve Outcome: Progressing   Problem: Activity: Goal: Interest or engagement in activities will improve Outcome: Progressing   Problem: Coping: Goal: Ability to demonstrate self-control will improve Outcome: Progressing   D: Pt alert and oriented on the unit. Pt engaging with RN staff and other pts. Pt denies SI/HI, A/VH. Pt attended and participated in groups and unit activities. Pt is pleasant and cooperative. A: Education, support and encouragement provided, q15 minute safety checks remain in effect. Medications administered per MD orders. R: No reactions/side effects to medicine noted. Pt denies any concerns at this time, and verbally contracts for safety. Pt ambulating on the unit with no issues. Pt remains safe on and off the unit.

## 2018-06-10 NOTE — Progress Notes (Signed)
Recreation Therapy Notes  Date: 10.30.19 Time: 1000 Location: 500 Hall Dayroom   Group Topic: Stress Management  Goal Area(s) Addresses:  Patient will verbalize importance of using healthy stress management.  Patient will identify positive emotions associated with healthy stress management.   Behavioral Response: Engaged  Intervention: Stress Management  Activity :  Guided Imagery & Meditation.  LRT introduced the stress management techniques of guided imagery and meditation.  LRT read a script for patients to envision themselves out looking at the starry sky at night.  LRT then played a meditation to help patients build up their resilience to uncomfortable situations.  Education:  Stress Management, Discharge Planning.   Education Outcome: Acknowledges edcuation/In group clarification offered/Needs additional education  Clinical Observations/Feedback: Pt actively participated in each technique.  Pt stated stress can be caused by financial worry.  Pt also expressed one of the signs of stress were fast heart rate.  Pt was pleasant and active during activity.    Caroll Rancher, LRT/CTRS         Caroll Rancher A 06/10/2018 11:37 AM

## 2018-06-10 NOTE — Progress Notes (Signed)
Did not attend group 

## 2018-06-10 NOTE — Progress Notes (Addendum)
CSW spoke with [employer] of [place] [facility].  They do have housing options for pt and will start looking into them with plan for discharge on Friday.  Peer support specialist Jefm Petty will come by [facility] to meet pt. Garner Nash, MSW, LCSW Clinical Social Worker 06/10/2018 11:10 AM   CSW spoke to Frost at [employer] who will make referral to social worker about further connection to community resources. Garner Nash, MSW, LCSW Clinical Social Worker 06/10/2018 1:22 PM

## 2018-06-11 NOTE — Plan of Care (Signed)
  Problem: Activity: Goal: Interest or engagement in activities will improve Outcome: Progressing   Problem: Coping: Goal: Ability to verbalize frustrations and anger appropriately will improve Outcome: Progressing  DAR NOTE: Patient presents with calm affect and pleasant mood.  Denies suicidal thoughts, pain, auditory and visual hallucinations.  Described energy level as normal and concentration as good.  Rates depression at 3.5, hopelessness at 2, and anxiety at 0.  Maintained on routine safety checks.  Medications given as prescribed.  Support and encouragement offered as needed.  Attended group and participated.  States goal for today is "keep mood elevated."  Patient observed socializing with peers in the dayroom.  Offered no complaint.
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
## 2018-06-11 NOTE — Progress Notes (Signed)
CSW spoke to [place], RN, [employer], [phone], and made referral.  They will open pt for case management. Garner Nash, MSW, LCSW Clinical Social Worker 06/11/2018 9:16 AM

## 2018-06-11 NOTE — [facility] Group Notes (Signed)
[facility] Group Notes:  (Nursing/MHT/Case Management/Adjunct)  Date:  06/11/2018  Time:  6:05 PM  Type of Therapy:  Psychoeducational Skills  Participation Level:  Active  Participation Quality:  Appropriate and Attentive  Affect:  Appropriate  Cognitive:  Alert and Appropriate  Insight:  Appropriate and Good  Engagement in Group:  Engaged  Modes of Intervention:  Activity and Socialization  Summary of Progress/Problems:  Patient attended and participated.  Patricia [name] 06/11/2018, 6:05 PM

## 2018-06-11 NOTE — [facility] Group Notes (Signed)
[facility] LCSW Group Therapy Note  Date/Time: 06/11/18, 1345  Type of Therapy/Topic:  Group Therapy:  Balance in Life  Participation Level:  moderate  Description of Group:    This group will address the concept of balance and how it feels and looks when one is unbalanced. Patients will be encouraged to process areas in their lives that are out of balance, and identify reasons for remaining unbalanced. Facilitators will guide patients utilizing problem- solving interventions to address and correct the stressor making their life unbalanced. Understanding and applying boundaries will be explored and addressed for obtaining  and maintaining a balanced life. Patients will be encouraged to explore ways to assertively make their unbalanced needs known to significant others in their lives, using other group members and facilitator for support and feedback.  Therapeutic Goals: 1. Patient will identify two or more emotions or situations they have that consume much of in their lives. 2. Patient will identify signs/triggers that life has become out of balance:  3. Patient will identify two ways to set boundaries in order to achieve balance in their lives:  4. Patient will demonstrate ability to communicate their needs through discussion and/or role plays  Summary of Patient Progress: Pt shared that work and finances are out of balance in her life.  She did not participate much in the group discussion but did respond to CSW questions and was attentive to the group discussion.          Therapeutic Modalities:   Cognitive Behavioral Therapy Solution-Focused Therapy Assertiveness Training  Daleen Squibb, [place]

## 2018-06-11 NOTE — Progress Notes (Signed)
Did not attend group 

## 2018-06-11 NOTE — Progress Notes (Signed)
Recreation Therapy Notes  Date: 10.31.19 Time: 1000 Location: 500 Hall Dayroom   Group Topic: Communication, Team Building, Problem Solving  Goal Area(s) Addresses:  Patient will effectively work with peer towards shared goal.  Patient will identify skill used to make activity successful.  Patient will identify how skills used during activity can be used to reach post d/c goals.   Behavioral Response: Engaged  Intervention: STEM Activity   Activity: [employer]. Patients were provided the following materials: 5 drinking straws, 5 rubber bands, 5 paper clips, 2 index cards, 2 drinking cups, and 2 toilet paper rolls. Using the provided materials patients were asked to build a launching mechanisms to launch a ping pong ball approximately 12 feet. Patients were divided into teams of 3-5.   Education: Pharmacist, community, Building control surveyor.   Education Outcome: Acknowledges education/In group clarification offered/Needs additional education.   Clinical Observations/Feedback: Pt was engaged and worked well with her peers.  Pt explained they had to use everyone's input to complete the activity.  Pt expressed that she could use communication from the activity with her support system first to let them know what was going on with her.    Caroll Rancher, LRT/CTRS      Caroll Rancher A 06/11/2018 11:17 AM

## 2018-06-12 ENCOUNTER — Other Ambulatory Visit: Payer: Self-pay

## 2018-06-12 MED ORDER — OLANZAPINE 15 MG PO TABS: 15.0000 mg | ORAL_TABLET | Freq: Every day | ORAL | 0 refills | Status: DC

## 2018-06-12 MED ORDER — TOPIRAMATE 100 MG PO TABS: 100.0000 mg | ORAL_TABLET | Freq: Every day | ORAL | 0 refills | Status: DC

## 2018-06-12 MED ORDER — TRAZODONE HCL 50 MG PO TABS: 50.0000 mg | ORAL_TABLET | Freq: Every evening | ORAL | 0 refills | Status: DC | PRN

## 2018-06-12 MED ORDER — ASPIRIN-ACETAMINOPHEN-CAFFEINE 250-250-65 MG PO TABS: 1.0000 | ORAL_TABLET | Freq: Three times a day (TID) | ORAL | 0 refills | Status: DC | PRN

## 2018-06-12 MED ORDER — HYDROXYZINE HCL 25 MG PO TABS: 25.0000 mg | ORAL_TABLET | Freq: Four times a day (QID) | ORAL | 0 refills | Status: DC | PRN

## 2018-06-12 MED ORDER — SERTRALINE HCL 50 MG PO TABS: 50.0000 mg | ORAL_TABLET | Freq: Every day | ORAL | 0 refills | Status: DC

## 2018-06-12 NOTE — Progress Notes (Signed)
D: Pt is preparing for discharge. Pt plans to stay with family.  A: Scheduled medication administered to pt, per MD orders. Support and encouragement provided. Frequent verbal contact made. Safety checks conducted q15 minutes.   R: Pt verbally contracts for safety. Pt remains safe at this time. Will continue to monitor.

## 2018-06-12 NOTE — Plan of Care (Signed)
Pt was able to identify coping skills at completion of recreation therapy group sessions.   Roseanna Koplin, LRT/CTRS 

## 2018-06-12 NOTE — [facility] Group Notes (Signed)
Date: 06/12/18, 1315  Type of Therapy and Topic: Chaplain group, "Hope is.." Chaplain engaged group in discussion about hope and what it looks like in each patients life and current situation.  Participation level:minimal  Modes of Intervention: Discussion, Education and Socialization  Summary of Progress/Problems:Pt present and somewhat paying attention but did not participate much in the group discussion.   Lorri Frederick, LCSW 06/12/2018 1:36 PM  [facility] LCSW Group Therapy Note

## 2018-06-12 NOTE — Progress Notes (Signed)
Recreation Therapy Notes  INPATIENT RECREATION TR PLAN  Patient Details Name: Patricia [name] MRN: [id number] DOB: 1971/04/25 Today's Date: 06/12/2018  Rec Therapy Plan Is patient appropriate for Therapeutic Recreation?: Yes Treatment times per week: about 3 days Estimated Length of Stay: 5-7 days TR Treatment/Interventions: Group participation (Comment)  Discharge Criteria Pt will be discharged from therapy if:: Discharged Treatment plan/goals/alternatives discussed and agreed upon by:: Patient/family  Discharge Summary Short term goals set: See patient care plan Short term goals met: Complete Progress toward goals comments: Groups attended Which groups?: Wellness, Goal setting, Stress management, Leisure education(Team building, Triggers) Reason goals not met: None Therapeutic equipment acquired: N/A Reason patient discharged from therapy: Discharge from hospital Pt/family agrees with progress & goals achieved: Yes Date patient discharged from therapy: 06/12/18     Victorino Sparrow, LRT/CTRS  Ria Comment, Aziza Stuckert A 06/12/2018, 9:29 AM

## 2018-06-12 NOTE — Progress Notes (Addendum)
CSW spoke to [employer] from [facility].  He reports that after CSW left on Wednesday, pt again told him that she did not want to work with him.  CSW had spoken to pt on Thursday and she agreed to work with [facility] with the understanding that if she found a family member to stay with, this would be the first choice.  Georga Kaufmann will come by today and if pt will sign the consent, he will take her to look for shelter placement. Winferd Humphrey, MSW, LCSW Clinical Social Worker 06/12/2018 9:17 AM   Jacki Cones from [facility] met with pt and she did agree to work with him.  She gave pt a phone number for a cousin and Georga Kaufmann was able to reach the cousin who was unable to offer help but gave his the name of pt brother, Joellyn Quails, [phone].  Tahleal spoke with brother who asked to speak with CSW and reported pt had been in group homes before and he felt she needed to be placed again.  CSW talked with pt about adding brother to ROI and pt refused and said she does not get along with this brother and does not want him involved.   Winferd Humphrey, MSW, LCSW Clinical Social Worker 06/12/2018 11:50 AM
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
## 2018-06-12 NOTE — Plan of Care (Signed)
D: Pt denies SI/HI/AV hallucinations. Pt is pleasant and cooperative. Pt goal for today is to work on going to group. A: Pt was offered support and encouragement. Pt was given scheduled medications. Pt was encourage to attend groups. Q 15 minute checks were done for safety.  R:Patient interacts well with peers and staff. Pt is taking medication. Pt has no complaints.Pt receptive to treatment and safety maintained on unit.    Problem: Education: Goal: Knowledge of [facility] Education information/materials will improve Outcome: Progressing Goal: Emotional status will improve Outcome: Progressing Goal: Mental status will improve Outcome: Progressing Goal: Verbalization of understanding the information provided will improve Outcome: Progressing   Problem: Activity: Goal: Interest or engagement in activities will improve Outcome: Progressing   Problem: Coping: Goal: Ability to verbalize frustrations and anger appropriately will improve Outcome: Progressing Goal: Ability to demonstrate self-control will improve Outcome: Progressing   Problem: Health Behavior/Discharge Planning: Goal: Identification of resources available to assist in meeting health care needs will improve Outcome: Progressing Goal: Compliance with treatment plan for underlying cause of condition will improve Outcome: Progressing   Problem: Safety: Goal: Periods of time without injury will increase Outcome: Progressing   Problem: Health Behavior/Discharge Planning: Goal: Compliance with therapeutic regimen will improve Outcome: Progressing   Problem: Safety: Goal: Ability to disclose and discuss suicidal ideas will improve Outcome: Progressing   Problem: Self-Concept: Goal: Will verbalize positive feelings about self Outcome: Progressing Goal: Level of anxiety will decrease Outcome: Progressing   Problem: Coping: Goal: Coping ability will improve Outcome: Progressing   Problem:  Medication: Goal: Compliance with prescribed medication regimen will improve Outcome: Progressing

## 2018-06-12 NOTE — Progress Notes (Signed)
Recreation Therapy Notes  Date: 11.1.19 Time: 1000 Location: 500 Hall  Group Topic: Communication  Goal Area(s) Addresses:  Patient will effectively communicate with peers in group.  Patient will effectively communicate to achieve shared group goal. Patient will identify techniques that made activity effective for group.   Behavioral Response: Engaged  Intervention: Round rubber discs  Activity: Sharks in [employer].  Each person was given a rubber disc and one disc was given to the group.  Patients were to use the discs to move the group from the starting point to the end of the hall and back.  If anyone stepped off their disc, the group would have to start over.  Education: Communication, Discharge Planning  Education Outcome: Acknowledges understanding/In group clarification offered/Needs additional education.   Clinical Observations/Feedback: Pt was bright and active throughout group session.  Pt followed her peers and seemed to trust their instruction.      Caroll Rancher, LRT/CTRS     Lillia Abed, Treyveon Mochizuki A 06/12/2018 11:03 AM

## 2018-06-12 NOTE — Progress Notes (Signed)
  [facility] Adult Case Management Discharge Plan :  Will you be returning to the same living situation after discharge:  No. Pt working with TCT for housing. At discharge, do you have transportation home?: No. TCT Do you have the ability to pay for your medications: Yes,  medicare  Release of information consent forms completed and in the chart;  Patient's signature needed at discharge.  Patient to Follow up at: Follow-up Information    [facility] Follow up.   Specialty:  Behavioral Health Why:  Your Transition Care Team will pick you up from the hospital to initiate services. Contact information: [address] [place] [address] [phone]           Next level of care provider has access to [facility] Link:no  Safety Planning and Suicide Prevention discussed: No.Pt declined consent.   Have you used any form of tobacco in the last 30 days? (Cigarettes, Smokeless Tobacco, Cigars, and/or Pipes): No  Has patient been referred to the Quitline?: N/A patient is not a smoker  Patient has been referred for addiction treatment: N/A  Lorri Frederick, LCSW 06/12/2018, 10:22 AM

## 2018-06-12 NOTE — Patient Outreach (Signed)
[facility]) Care Management  06/12/2018  Patricia [name] 05/01/1971 [id number]  Transition of care  Referral date: 06/11/18 Referral source: [facility] Insurance: health team advantage Attempt #1  Telephone call to patient regarding referral. Unable to reach patient. HIPAA compliant voice message left with call back phone number.   PLAN: RNCM will attempt 2nd telephone call to patient within 4 business days. RNCM will send outreach letter.   George Ina RN,BSN, CCM [facility] Telephonic  [phone]

## 2018-06-12 NOTE — BHH Suicide Risk Assessment (Signed)
[facility] Discharge Suicide Risk Assessment   Principal Problem: Schizoaffective disorder, bipolar type [facility]) Discharge Diagnoses:  Patient Active Problem List   Diagnosis Date Noted  . Suicidal ideation [R45.851]   . Schizoaffective disorder, bipolar type (HCC) [F25.0]   . Depression [F32.9] 05/21/2018    Total Time spent with patient: 30 minutes  Psychiatric Specialty Exam:   Blood pressure (!) 141/88, pulse 99, temperature 98.7 F (37.1 C), temperature source Oral, resp. rate 20, height 5\' 3"  (1.6 m), weight 61.2 kg, SpO2 100 %.Body mass index is 23.91 kg/m.   Mental Status Per Nursing Assessment::   On Admission:  Suicidal ideation indicated by patient, Suicide plan, Self-harm thoughts, Intention to act on suicide plan, Belief that plan would result in death  Demographic Factors:  Low socioeconomic status, Living alone and Unemployed  Loss Factors: Financial problems/change in socioeconomic status  Historical Factors: Impulsivity  Risk Reduction Factors:   Positive therapeutic relationship and Positive coping skills or problem solving skills  Continued Clinical Symptoms:  Schizophrenia:   Paranoid or undifferentiated type  Cognitive Features That Contribute To Risk:  None    Suicide Risk:  Minimal: No identifiable suicidal ideation.  Patients presenting with no risk factors but with morbid ruminations; may be classified as minimal risk based on the severity of the depressive symptoms  Follow-up Information    [facility] Follow up.   Specialty:  Behavioral Health Why:  Your Transition Care Team will pick you up from the hospital to initiate services. Contact information: [address] [place] [address] [phone]           Plan Of Care/Follow-up recommendations:  Activity:  as tolerated Diet:  normal Tests:  NA Other:  see above for DC plan  Micheal Likens, MD 06/12/2018, 9:39 AM

## 2018-06-13 NOTE — Progress Notes (Signed)
Adult Psychoeducational Group Note  Date:  06/13/2018 Time:  8:26 PM  Group Topic/Focus:  Wrap-Up Group:   The focus of this group is to help patients review their daily goal of treatment and discuss progress on daily workbooks.  Participation Level:  Active  Participation Quality:  Appropriate  Affect:  Appropriate  Cognitive:  Appropriate  Insight: Appropriate  Engagement in Group:  Engaged  Modes of Intervention:  Discussion  Additional Comments: The patient expressed that she rates today a 7.The patient also said that she attended group.  Octavio Manns 06/13/2018, 8:26 PM

## 2018-06-13 NOTE — BHH Counselor (Addendum)
Clinical Social Work Note  Apparently per nursing report, [facility]'s Transitional Care Team did not pick up patient yesterday due to something going on that had nothing to do with patient.  They have not called or made contact today.  CSW has attempted to contact them through the [facility] and the [employer] crisis #, and has been unsuccessful in getting any phone number that would aid in being in touch with someone on the Transitional Care Team to pick up patient to follow up.  Since her housing is being arranged by that team, we are unable to make arrangements for her at this time.  Ambrose Mantle, LCSW 06/13/2018, 3:56 PM  CSW was able to make contact with [facility], who stated there is no on-call for Transitional Care Team over the weekend.  Arrangements for discharge will need to be made on Monday.  Ambrose Mantle, LCSW 06/13/2018, 5:53 PM

## 2018-06-13 NOTE — Progress Notes (Signed)
D: Pt denies SI/HI/AV hallucinations. Pt is pleasant and cooperative. Pt goal for today is to work on going home. Patient has been in milieu tonight watching television.  A: Pt was offered support and encouragement. Pt was given scheduled medications. Pt was encourage to attend groups. Q 15 minute checks were done for safety.  R:Pt attends groups and interacts well with peers and staff. Pt is taking medication. Pt has no complaints.Pt receptive to treatment and safety maintained on unit.

## 2018-06-13 NOTE — Progress Notes (Signed)
D. Pt sitting on the side of bed- smiling upon initial approach. Pt is calm and cooperative, anticipating to be discharged today- reports that she will be living with a relative. Pt reports that her mood has improved much since admission.Pt currently denies SI/HI and AV hallucinations. A. Labs and vitals monitored. Pt compliant with medications. Pt supported emotionally and encouraged to express concerns and ask questions.   R. Pt remains safe with 15 minute checks. Will continue POC.

## 2018-06-13 NOTE — Progress Notes (Addendum)
[facility] MD Progress Note  06/13/2018 1:08 PM Patricia [name]  MRN:  [id number] Subjective:    Patricia [name] seen sitting in day room interacting with peers.  She is awake alert and oriented x3.  Chart reviewed patient was scheduled discharge 06/12/2018 however pending caseworker arrival.  Nursing staff to follow-up with case management.  Patient continues to deny suicidal or homicidal ideations.  Denies auditory visual hallucinations.  Reports a good appetite.  States she rested well throughout the night.  Patient reports she is eager to discharge states she is going to reside with the uncle.  No medication concerns at this time.  Support, encouragement and  reassurance was provided.   Per assessment note on admission:-Patient is a 47 year old female with a past psychiatric history significant for schizophrenia versus psychosis. The patient originally presented to the [facility] emergency department with complaints of suicidal ideation. The patient stated that she had been living in an apartment with with bugs in it and decided she had to leave. She stated she was going to stay with a friend, but on the way she decided to go to the emergency room. The patient told the folks in the emergency room that she was on disability for mental health issues. At that time she did not appear to be grossly psychotic. They decided to discharge her home, and referred her from [facility] for outpatient services. According to the notes she was discharged and was told to leave by security. She waited for a ride that did not come. She then decided to walk to the bus stop, but had no bus pass. She reported then that she became dizzy and stressed and decided that she wanted to shoot herself in the head. She reported that that time that she had a history of depression. She stated she had been prescribed Wellbutrin. It was decided to admit her to the hospital for evaluation and stabilization. Review of the electronic  medical record revealed that she has a history of psychosis or schizophrenia. Her last psychiatric hospitalization was in June of this year at [facility]. The notes from [facility] revealed that she was on her way from [place] to [place] pursuing a delusion about the [employer]. She ran out of gas in the [facility] area. Bystanders who found her called local police who brought her to the hospital. Principal Problem: Schizoaffective disorder, bipolar type (HCC) Diagnosis:   Patient Active Problem List   Diagnosis Date Noted  . Suicidal ideation [R45.851]   . Schizoaffective disorder, bipolar type (HCC) [F25.0]   . Depression [F32.9] 05/21/2018   Total Time spent with patient: 20 minutes  Past Psychiatric History:   Past Medical History:  Past Medical History:  Diagnosis Date  . Depression    History reviewed. No pertinent surgical history. Family History: History reviewed. No pertinent family history. Family Psychiatric  History:  Social History:  Social History   Substance and Sexual Activity  Alcohol Use Yes  . Alcohol/week: 1.0 standard drinks  . Types: 1 Glasses of wine per week   Comment: social      Social History   Substance and Sexual Activity  Drug Use No    Social History   Socioeconomic History  . Marital status: Divorced    Spouse name: Not on file  . Number of children: Not on file  . Years of education: Not on file  . Highest education level: Not on file  Occupational History  . Not on file  Social Needs  . Financial resource strain: Not on file  . Food insecurity:    Worry: Not on file    Inability: Not on file  . Transportation needs:    Medical: Not on file    Non-medical: Not on file  Tobacco Use  . Smoking status: Never Smoker  . Smokeless tobacco: Never Used  Substance and Sexual Activity  . Alcohol use: Yes    Alcohol/week: 1.0 standard drinks    Types: 1 Glasses of wine per week    Comment: social   . Drug  use: No  . Sexual activity: Not on file  Lifestyle  . Physical activity:    Days per week: Not on file    Minutes per session: Not on file  . Stress: Not on file  Relationships  . Social connections:    Talks on phone: Not on file    Gets together: Not on file    Attends religious service: Not on file    Active member of club or organization: Not on file    Attends meetings of clubs or organizations: Not on file    Relationship status: Not on file  Other Topics Concern  . Not on file  Social History Narrative  . Not on file   Additional Social History:  Specify valuables returned: [employer], blue pullover, ramen noodles, red dress, hair brush, bag of armbands, pink heels, brown belt, pink water bottle, phone, toothbrush, laces, purse                       Sleep: Fair  Appetite:  Fair  Current Medications: Current Facility-Administered Medications  Medication Dose Route Frequency Provider Last Rate Last Dose  . acetaminophen (TYLENOL) tablet 650 mg  650 mg Oral Q6H PRN Antonieta Pert, MD   650 mg at 06/01/18 2117  . aspirin-acetaminophen-caffeine (EXCEDRIN MIGRAINE) per tablet 1 tablet  1 tablet Oral Q8H PRN Armandina Stammer I, NP   1 tablet at 06/12/18 0748  . famotidine (PEPCID) tablet 20 mg  20 mg Oral Daily Antonieta Pert, MD   20 mg at 06/13/18 0753  . haloperidol lactate (HALDOL) injection 2 mg  2 mg Intramuscular Q6H PRN Antonieta Pert, MD      . hydrOXYzine (ATARAX/VISTARIL) tablet 25 mg  25 mg Oral Q6H PRN Antonieta Pert, MD      . loperamide (IMODIUM) capsule 4 mg  4 mg Oral PRN Micheal Likens, MD      . LORazepam (ATIVAN) tablet 1 mg  1 mg Oral Q4H PRN Antonieta Pert, MD       Or  . LORazepam (ATIVAN) injection 2 mg  2 mg Intramuscular Q4H PRN Antonieta Pert, MD      . magnesium hydroxide (MILK OF MAGNESIA) suspension 15 mL  15 mL Oral QHS PRN Antonieta Pert, MD      . multivitamin with minerals tablet 1 tablet  1 tablet  Oral Daily Antonieta Pert, MD   1 tablet at 06/13/18 0753  . OLANZapine (ZYPREXA) tablet 15 mg  15 mg Oral QHS Antonieta Pert, MD   15 mg at 06/12/18 2105  . ondansetron (ZOFRAN-ODT) disintegrating tablet 4 mg  4 mg Oral Q8H PRN Micheal Likens, MD      . sertraline (ZOLOFT) tablet 50 mg  50 mg Oral Daily Micheal Likens, MD   50 mg at 06/13/18 0753  . topiramate (TOPAMAX) tablet 100  mg  100 mg Oral QHS Armandina Stammer I, NP   100 mg at 06/12/18 2105  . traZODone (DESYREL) tablet 50 mg  50 mg Oral QHS PRN Antonieta Pert, MD        Lab Results: No results found for this or any previous visit (from the past 48 hour(s)).  Blood Alcohol level:  Lab Results  Component Value Date   ETH <10 05/21/2018   ETH <5 09/12/2015    Metabolic Disorder Labs: Lab Results  Component Value Date   HGBA1C 5.5 05/24/2018   MPG 111.15 05/24/2018   No results found for: PROLACTIN No results found for: CHOL, TRIG, HDL, CHOLHDL, VLDL, LDLCALC  Physical Findings: AIMS: Facial and Oral Movements Muscles of Facial Expression: None, normal Lips and Perioral Area: None, normal Jaw: None, normal Tongue: None, normal,Extremity Movements Upper (arms, wrists, hands, fingers): None, normal Lower (legs, knees, ankles, toes): None, normal, Trunk Movements Neck, shoulders, hips: None, normal, Overall Severity Severity of abnormal movements (highest score from questions above): None, normal Incapacitation due to abnormal movements: None, normal Patient's awareness of abnormal movements (rate only patient's report): No Awareness, Dental Status Current problems with teeth and/or dentures?: No Does patient usually wear dentures?: No  CIWA:  CIWA-Ar Total: 0 COWS:  COWS Total Score: 0  Musculoskeletal: Strength & Muscle Tone: within normal limits Gait & Station: normal Patient leans: N/A  Psychiatric Specialty Exam: Physical Exam  Review of Systems  Psychiatric/Behavioral: Negative  for depression (improving ) and suicidal ideas. The patient is not nervous/anxious and does not have insomnia.     Blood pressure 121/74, pulse 95, temperature 98.7 F (37.1 C), temperature source Oral, resp. rate 20, height 5\' 3"  (1.6 m), weight 61.2 kg, SpO2 100 %.Body mass index is 23.91 kg/m.  General Appearance: Casual  Eye Contact:  Fair  Speech:  NA  Volume:  Normal  Mood:  Depressed  Affect:  Congruent  Thought Process:  Coherent  Orientation:  Full (Time, Place, and Person)  Thought Content:  Hallucinations: None  Suicidal Thoughts:  No  Homicidal Thoughts:  No  Memory:  Immediate;   Fair Recent;   Fair Remote;   Fair  Judgement:  Fair  Insight:  Fair  Psychomotor Activity:  Normal  Concentration:  Concentration: Fair  Recall:  [employer] of Knowledge:  Fair  Language:  Fair  Akathisia:  No  Handed:  Right  AIMS (if indicated):     Assets:  Communication Skills Desire for Improvement Social Support  ADL's:  Intact  Cognition:  WNL  Sleep:  Number of Hours: 6     Treatment Plan Summary: Daily contact with patient to assess and evaluate symptoms and progress in treatment and Medication management   Continue with current treatment plan as listed below on 06/13/2018 except were noted  Insomnia:  Continue trazodone 50 mg p.o. Nightly  Mood stabilization:   Continue Zyprexa 15 mg p.o. Nightly  Continue Topamax 100 mg p.o. Daily  Continue Zoloft 50 mg p.o. daily     Continue to encourage participation throughout the milieu CSW to follow-up with case management Discharge disposition pending caseworker arrival.   Oneta Rack, NP 06/13/2018, 1:08 PM   ..Agree with NP Progress Note

## 2018-06-13 NOTE — [facility] Group Notes (Signed)
  [facility] LCSW Group Therapy Note  Date/Time:  06/13/2018 11:15AM-12:00PM  Type of Therapy and Topic:  Group Therapy:  Feelings About Hospitalization  Participation Level:  Active   Description of Group This process group involved patients discussing their feelings related to being hospitalized, as well as the benefits they see to being in the hospital.  These feelings and benefits were itemized.  The group then brainstormed specific ways in which they could seek those same benefits when they discharge and return home.  Therapeutic Goals 1. Patient will identify and describe positive and negative feelings related to hospitalization 2. Patient will verbalize benefits of hospitalization to themselves personally 3. Patients will brainstorm together ways they can obtain similar benefits in the outpatient setting, identify barriers to wellness and possible solutions  Summary of Patient Progress:  The patient expressed hwe primary feelings about being hospitalized are that it has been good because she was depressed, but that she is ready to go home and anticipates being discharged today.  Her affect was broad and she was attentive although she did not talk otherwise.  Therapeutic Modalities Cognitive Behavioral Therapy Motivational Interviewing    Ambrose Mantle, LCSW 06/13/2018, 1:13 PM

## 2018-06-13 NOTE — [facility] Group Notes (Signed)
[facility] Group Notes:  (Nursing)  Date:  06/13/2018  Time:130 PM Type of Therapy:  Nurse Education  Participation Level:  Active  Participation Quality:  Appropriate  Affect:  Appropriate  Cognitive:  Appropriate  Insight:  Improving  Engagement in Group:  Improving  Modes of Intervention:  Discussion and Education  Summary of Progress/Problems: Nurse led group played a noncompetitive learning/communication board game that fosters listening skills as well as self expression  Shela Nevin 06/13/2018, 3:30 PM

## 2018-06-14 NOTE — Progress Notes (Signed)
D:Patient observed on unit. Patient is in a good mood. Patient denies SI/HI and A/V hallucinations. Patient goal is to get home. Patient voices no concerns.  A:Patient provided support and encouragement. Q 15 minute checks in progress and patient remains safe on unit. Patient administered medications as prescribed.  R:Patient remains safe on unit. Patient attends groups. Monitoring of patient continues.

## 2018-06-14 NOTE — [facility] Group Notes (Signed)
[facility] Group Notes:  (Nursing)  Date:  06/14/2018  Time: 945 AM Type of Therapy:  Nurse Education  Participation Level:  Minimal  Participation Quality:  Appropriate  Affect:  Appropriate  Cognitive:  Appropriate  Insight:  Appropriate  Engagement in Group:  Engaged  Modes of Intervention:  Discussion and Education  Summary of Progress/Problems: Nurse led Goals/Orientation Group  Shela Nevin 06/14/2018, 10:13 AM

## 2018-06-14 NOTE — Progress Notes (Addendum)
[facility] MD Progress Note  06/14/2018 10:33 AM Patricia [name]  MRN:  [id number] Subjective:    Patricia [name] seen sitting in bed.  Presents pleasant, calm and cooperative.  Reports she is hopeful that she will be discharged today.  Continues to deny suicidal or homicidal ideation.  Denies auditory or visual hallucinations.  Reports she attempted to contact her aunt and uncle to pick her up today however states that she is unable to leave today she was advised the act team will  Follow-up with a different primary care for housing.  Chart reviewed no concerns for patient throughout the milieu.  Patricia [name] to continues to participate and attend daily group sessions.  Reports she has a plan to follow-up with [facility] for medication management .  Social work to follow-up with discharge disposition 4 06/15/2018.  Support, encouragement and reassurance was provided.   Per assessment note on admission:-Patient is a 47 year old female with a past psychiatric history significant for schizophrenia versus psychosis. The patient originally presented to the [facility] emergency department with complaints of suicidal ideation. The patient stated that she had been living in an apartment with with bugs in it and decided she had to leave. She stated she was going to stay with a friend, but on the way she decided to go to the emergency room. The patient told the folks in the emergency room that she was on disability for mental health issues. At that time she did not appear to be grossly psychotic. They decided to discharge her home, and referred her from [facility] for outpatient services. According to the notes she was discharged and was told to leave by security. She waited for a ride that did not come. She then decided to walk to the bus stop, but had no bus pass. She reported then that she became dizzy and stressed and decided that she wanted to shoot herself in the head. She reported that that time that she had a  history of depression. She stated she had been prescribed Wellbutrin. It was decided to admit her to the hospital for evaluation and stabilization. Review of the electronic medical record revealed that she has a history of psychosis or schizophrenia. Her last psychiatric hospitalization was in June of this year at [facility]. The notes from [facility] revealed that she was on her way from [place] to [place] pursuing a delusion about the [employer]. She ran out of gas in the [facility] area. Bystanders who found her called local police who brought her to the hospital. Principal Problem: Schizoaffective disorder, bipolar type (HCC) Diagnosis:   Patient Active Problem List   Diagnosis Date Noted  . Suicidal ideation [R45.851]   . Schizoaffective disorder, bipolar type (HCC) [F25.0]   . Depression [F32.9] 05/21/2018   Total Time spent with patient: 20 minutes  Past Psychiatric History:   Past Medical History:  Past Medical History:  Diagnosis Date  . Depression    History reviewed. No pertinent surgical history. Family History: History reviewed. No pertinent family history. Family Psychiatric  History:  Social History:  Social History   Substance and Sexual Activity  Alcohol Use Yes  . Alcohol/week: 1.0 standard drinks  . Types: 1 Glasses of wine per week   Comment: social      Social History   Substance and Sexual Activity  Drug Use No    Social History   Socioeconomic History  . Marital status: Divorced    Spouse name: Not on file  .  Number of children: Not on file  . Years of education: Not on file  . Highest education level: Not on file  Occupational History  . Not on file  Social Needs  . Financial resource strain: Not on file  . Food insecurity:    Worry: Not on file    Inability: Not on file  . Transportation needs:    Medical: Not on file    Non-medical: Not on file  Tobacco Use  . Smoking status: Never Smoker  . Smokeless  tobacco: Never Used  Substance and Sexual Activity  . Alcohol use: Yes    Alcohol/week: 1.0 standard drinks    Types: 1 Glasses of wine per week    Comment: social   . Drug use: No  . Sexual activity: Not on file  Lifestyle  . Physical activity:    Days per week: Not on file    Minutes per session: Not on file  . Stress: Not on file  Relationships  . Social connections:    Talks on phone: Not on file    Gets together: Not on file    Attends religious service: Not on file    Active member of club or organization: Not on file    Attends meetings of clubs or organizations: Not on file    Relationship status: Not on file  Other Topics Concern  . Not on file  Social History Narrative  . Not on file   Additional Social History:  Specify valuables returned: [employer], blue pullover, ramen noodles, red dress, hair brush, bag of armbands, pink heels, brown belt, pink water bottle, phone, toothbrush, laces, purse                       Sleep: Fair  Appetite:  Fair  Current Medications: Current Facility-Administered Medications  Medication Dose Route Frequency Provider Last Rate Last Dose  . acetaminophen (TYLENOL) tablet 650 mg  650 mg Oral Q6H PRN Antonieta Pert, MD   650 mg at 06/13/18 2103  . aspirin-acetaminophen-caffeine (EXCEDRIN MIGRAINE) per tablet 1 tablet  1 tablet Oral Q8H PRN Armandina Stammer I, NP   1 tablet at 06/12/18 0748  . famotidine (PEPCID) tablet 20 mg  20 mg Oral Daily Antonieta Pert, MD   20 mg at 06/14/18 0754  . haloperidol lactate (HALDOL) injection 2 mg  2 mg Intramuscular Q6H PRN Antonieta Pert, MD      . hydrOXYzine (ATARAX/VISTARIL) tablet 25 mg  25 mg Oral Q6H PRN Antonieta Pert, MD      . loperamide (IMODIUM) capsule 4 mg  4 mg Oral PRN Micheal Likens, MD      . LORazepam (ATIVAN) tablet 1 mg  1 mg Oral Q4H PRN Antonieta Pert, MD       Or  . LORazepam (ATIVAN) injection 2 mg  2 mg Intramuscular Q4H PRN Antonieta Pert, MD      . magnesium hydroxide (MILK OF MAGNESIA) suspension 15 mL  15 mL Oral QHS PRN Antonieta Pert, MD      . multivitamin with minerals tablet 1 tablet  1 tablet Oral Daily Antonieta Pert, MD   1 tablet at 06/14/18 0754  . OLANZapine (ZYPREXA) tablet 15 mg  15 mg Oral QHS Antonieta Pert, MD   15 mg at 06/13/18 2101  . ondansetron (ZOFRAN-ODT) disintegrating tablet 4 mg  4 mg Oral Q8H PRN Micheal Likens, MD      .  sertraline (ZOLOFT) tablet 50 mg  50 mg Oral Daily Micheal Likens, MD   50 mg at 06/14/18 0754  . topiramate (TOPAMAX) tablet 100 mg  100 mg Oral QHS Armandina Stammer I, NP   100 mg at 06/13/18 2101  . traZODone (DESYREL) tablet 50 mg  50 mg Oral QHS PRN Antonieta Pert, MD        Lab Results: No results found for this or any previous visit (from the past 48 hour(s)).  Blood Alcohol level:  Lab Results  Component Value Date   ETH <10 05/21/2018   ETH <5 09/12/2015    Metabolic Disorder Labs: Lab Results  Component Value Date   HGBA1C 5.5 05/24/2018   MPG 111.15 05/24/2018   No results found for: PROLACTIN No results found for: CHOL, TRIG, HDL, CHOLHDL, VLDL, LDLCALC  Physical Findings: AIMS: Facial and Oral Movements Muscles of Facial Expression: None, normal Lips and Perioral Area: None, normal Jaw: None, normal Tongue: None, normal,Extremity Movements Upper (arms, wrists, hands, fingers): None, normal Lower (legs, knees, ankles, toes): None, normal, Trunk Movements Neck, shoulders, hips: None, normal, Overall Severity Severity of abnormal movements (highest score from questions above): None, normal Incapacitation due to abnormal movements: None, normal Patient's awareness of abnormal movements (rate only patient's report): No Awareness, Dental Status Current problems with teeth and/or dentures?: No Does patient usually wear dentures?: No  CIWA:  CIWA-Ar Total: 0 COWS:  COWS Total Score: 0  Musculoskeletal: Strength &  Muscle Tone: within normal limits Gait & Station: normal Patient leans: N/A  Psychiatric Specialty Exam: Physical Exam  Nursing note and vitals reviewed. Constitutional: She appears well-developed.  Skin: Skin is warm and dry.    Review of Systems  Psychiatric/Behavioral: Negative for depression (improving ) and suicidal ideas. The patient is not nervous/anxious and does not have insomnia.   All other systems reviewed and are negative.   Blood pressure 126/86, pulse 87, temperature (!) 97.4 F (36.3 C), temperature source Oral, resp. rate 20, height 5\' 3"  (1.6 m), weight 61.2 kg, SpO2 100 %.Body mass index is 23.91 kg/m.  General Appearance: Casual and Well Groomed  Eye Contact:  Fair  Speech:  NA  Volume:  Normal  Mood:  Depressed  Affect:  Congruent  Thought Process:  Coherent  Orientation:  Full (Time, Place, and Person)  Thought Content:  Hallucinations: None  Suicidal Thoughts:  No  Homicidal Thoughts:  No  Memory:  Immediate;   Fair Recent;   Fair Remote;   Fair  Judgement:  Fair  Insight:  Fair  Psychomotor Activity:  Normal  Concentration:  Concentration: Fair  Recall:  [employer] of Knowledge:  Fair  Language:  Fair  Akathisia:  No  Handed:  Right  AIMS (if indicated):     Assets:  Communication Skills Desire for Improvement Social Support  ADL's:  Intact  Cognition:  WNL  Sleep:  Number of Hours: 7.75     Treatment Plan Summary: Daily contact with patient to assess and evaluate symptoms and progress in treatment and Medication management   Continue with current treatment plan as listed below on 06/14/2018 except were noted  Insomnia:  Continue trazodone 50 mg p.o. Nightly  Mood stabilization:   Continue Zyprexa 15 mg p.o. Nightly  Continue Topamax 100 mg p.o. Daily  Continue Zoloft 50 mg p.o. daily    Continue to encourage participation throughout the milieu CSW to follow-up with case management Discharge disposition pending caseworker  arrival.  06/14/2018, 10:33 AM Oneta Rack, NP   .Marland KitchenAgree with NP Progress Note

## 2018-06-14 NOTE — Progress Notes (Signed)
D. Pt presents with a sad affect/mood, but brightens during interactions. Pt is calm and cooperative, friendly during interactions. Pt is observed interacting appropriately, but minimally with peers on the unit. Denies, depression, hopelessness and anxiety.  Pt currently denies pain, SI/HI and AV hallucinations. A. Labs and vitals monitored. Pt compliant with medications. Pt supported emotionally and encouraged to express concerns and ask questions.   R. Pt remains safe with 15 minute checks. Will continue POC.

## 2018-06-14 NOTE — [facility] Group Notes (Signed)
[facility] LCSW Group Therapy Note  Date/Time:  06/14/2018  11:00AM-12:00PM  Type of Therapy and Topic:  Group Therapy:  Music and Mood  Participation Level:  Active   Description of Group: In this process group, members listened to a variety of genres of music and identified that different types of music evoke different responses.  Patients were encouraged to identify music that was soothing for them and music that was energizing for them.  Patients discussed how this knowledge can help with wellness and recovery in various ways including managing depression and anxiety as well as encouraging healthy sleep habits.    Therapeutic Goals: 1. Patients will explore the impact of different varieties of music on mood 2. Patients will verbalize the thoughts they have when listening to different types of music 3. Patients will identify music that is soothing to them as well as music that is energizing to them 4. Patients will discuss how to use this knowledge to assist in maintaining wellness and recovery 5. Patients will explore the use of music as a coping skill  Summary of Patient Progress:  At the beginning of group, patient expressed that she felt in a "good mood" and at the end of group said she felt even better.  She participated fully and had a broad range of affect throughout group.  Therapeutic Modalities: Solution Focused Brief Therapy Activity   Ambrose Mantle, LCSW

## 2018-06-15 NOTE — Tx Team (Signed)
Interdisciplinary Treatment and Diagnostic Plan Update  06/15/2018 Time of Session: 0845 Patricia [name] MRN: [id number]  Principal Diagnosis: Schizoaffective disorder, bipolar type [facility])  Secondary Diagnoses: Principal Problem:   Schizoaffective disorder, bipolar type (HCC)   Current Medications:  Current Facility-Administered Medications  Medication Dose Route Frequency Provider Last Rate Last Dose  . acetaminophen (TYLENOL) tablet 650 mg  650 mg Oral Q6H PRN Antonieta Pert, MD   650 mg at 06/13/18 2103  . aspirin-acetaminophen-caffeine (EXCEDRIN MIGRAINE) per tablet 1 tablet  1 tablet Oral Q8H PRN Armandina Stammer I, NP   1 tablet at 06/12/18 0748  . famotidine (PEPCID) tablet 20 mg  20 mg Oral Daily Antonieta Pert, MD   20 mg at 06/15/18 0843  . haloperidol lactate (HALDOL) injection 2 mg  2 mg Intramuscular Q6H PRN Antonieta Pert, MD      . hydrOXYzine (ATARAX/VISTARIL) tablet 25 mg  25 mg Oral Q6H PRN Antonieta Pert, MD      . loperamide (IMODIUM) capsule 4 mg  4 mg Oral PRN Micheal Likens, MD      . LORazepam (ATIVAN) tablet 1 mg  1 mg Oral Q4H PRN Antonieta Pert, MD       Or  . LORazepam (ATIVAN) injection 2 mg  2 mg Intramuscular Q4H PRN Antonieta Pert, MD      . magnesium hydroxide (MILK OF MAGNESIA) suspension 15 mL  15 mL Oral QHS PRN Antonieta Pert, MD      . multivitamin with minerals tablet 1 tablet  1 tablet Oral Daily Antonieta Pert, MD   1 tablet at 06/15/18 [phone]  . OLANZapine (ZYPREXA) tablet 15 mg  15 mg Oral QHS Antonieta Pert, MD   15 mg at 06/14/18 2209  . ondansetron (ZOFRAN-ODT) disintegrating tablet 4 mg  4 mg Oral Q8H PRN Micheal Likens, MD      . sertraline (ZOLOFT) tablet 50 mg  50 mg Oral Daily Micheal Likens, MD   50 mg at 06/15/18 1191  . topiramate (TOPAMAX) tablet 100 mg  100 mg Oral QHS Armandina Stammer I, NP   100 mg at 06/14/18 2209  . traZODone (DESYREL) tablet 50 mg  50 mg Oral QHS  PRN Antonieta Pert, MD       PTA Medications: Medications Prior to Admission  Medication Sig Dispense Refill Last Dose  . acetaminophen (TYLENOL) 325 MG tablet Take 325-650 mg by mouth every 6 (six) hours as needed for mild pain, moderate pain or headache.   Unk at [facility]  . aspirin-acetaminophen-caffeine (EXCEDRIN MIGRAINE) [phone] MG tablet Take 1 tablet by mouth every 6 (six) hours as needed for headache or migraine.   Unk at [facility]    Patient Stressors: Financial difficulties Marital or family conflict Occupational concerns  Patient Strengths: Capable of independent living Motivation for treatment/growth Supportive family/friends  Treatment Modalities: Medication Management, Group therapy, Case management,  1 to 1 session with clinician, Psychoeducation, Recreational therapy.   Physician Treatment Plan for Primary Diagnosis: Schizoaffective disorder, bipolar type (HCC) Long Term Goal(s): Improvement in symptoms so as ready for discharge Improvement in symptoms so as ready for discharge   Short Term Goals: Ability to identify changes in lifestyle to reduce recurrence of condition will improve Ability to verbalize feelings will improve Ability to disclose and discuss suicidal ideas Ability to demonstrate self-control will improve Ability to identify and develop effective coping behaviors will improve Ability to maintain clinical measurements within normal  limits will improve Compliance with prescribed medications will improve Ability to identify changes in lifestyle to reduce recurrence of condition will improve Ability to verbalize feelings will improve Ability to disclose and discuss suicidal ideas Ability to demonstrate self-control will improve Ability to identify and develop effective coping behaviors will improve Ability to maintain clinical measurements within normal limits will improve Compliance with prescribed medications will improve  Medication Management:  Evaluate patient's response, side effects, and tolerance of medication regimen.  Therapeutic Interventions: 1 to 1 sessions, Unit Group sessions and Medication administration.  Evaluation of Outcomes: Progressing   10/17: "I have blurry vision. I feel light-headed, tired all the time. I have bad headaches. I really feel sad because I can't get all my children & I don't know where they are. I'm sleeping too much at night & I nap during the day too. I think I'm depressed, not manic or Schizophrenic. My twin brother has Asperger & ADHD. Can you all at least help me find my kids. 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 The OBGYN kept on taking all the kids as I was told. I don't know how to deal with these things".  -Schizoaffective disorder, bipolar type -Continue Invega 6mg  po qDay -Continue zoloft 50mg  po qDay.  Mood stabilization.             - Initiated Topamax 100 mg po Q bedtime.  Physician Treatment Plan for Secondary Diagnosis: Principal Problem:   Schizoaffective disorder, bipolar type (HCC)  Long Term Goal(s): Improvement in symptoms so as ready for discharge Improvement in symptoms so as ready for discharge   Short Term Goals: Ability to identify changes in lifestyle to reduce recurrence of condition will improve Ability to verbalize feelings will improve Ability to disclose and discuss suicidal ideas Ability to demonstrate self-control will improve Ability to identify and develop effective coping behaviors will improve Ability to maintain clinical measurements within normal limits will improve Compliance with prescribed medications will improve Ability to identify changes in lifestyle to reduce recurrence of condition will improve Ability to verbalize feelings will improve Ability to disclose and discuss suicidal ideas Ability to demonstrate self-control will improve Ability to identify and develop effective coping  behaviors will improve Ability to maintain clinical measurements within normal limits will improve Compliance with prescribed medications will improve     Medication Management: Evaluate patient's response, side effects, and tolerance of medication regimen.  Therapeutic Interventions: 1 to 1 sessions, Unit Group sessions and Medication administration.  Evaluation of Outcomes: Progressing   RN Treatment Plan for Primary Diagnosis: Schizoaffective disorder, bipolar type (HCC) Long Term Goal(s): Knowledge of disease and therapeutic regimen to maintain health will improve  Short Term Goals: Ability to identify and develop effective coping behaviors will improve and Compliance with prescribed medications will improve  Medication Management: RN will administer medications as ordered by provider, will assess and evaluate patient's response and provide education to patient for prescribed medication. RN will report any adverse and/or side effects to prescribing provider.  Therapeutic Interventions: 1 on 1 counseling sessions, Psychoeducation, Medication administration, Evaluate responses to treatment, Monitor vital signs and CBGs as ordered, Perform/monitor CIWA, COWS, AIMS and Fall Risk screenings as ordered, Perform wound care treatments as ordered.  Evaluation of Outcomes: Progressing   LCSW Treatment Plan for Primary Diagnosis: Schizoaffective disorder, bipolar type (HCC) Long Term Goal(s): Safe transition to appropriate next level of care at discharge, Engage patient in therapeutic group addressing interpersonal concerns.  Short Term Goals: Engage patient in aftercare planning with referrals and resources, Increase social support, Facilitate acceptance of mental health diagnosis and concerns and Increase skills for wellness and recovery  Therapeutic Interventions: Assess for all discharge needs, 1 to 1 time with Social worker, Explore available resources and support systems, Assess for  adequacy in community support network, Educate family and significant other(s) on suicide prevention, Complete Psychosocial Assessment, Interpersonal group therapy.  Evaluation of Outcomes: Progressing   Progress in Treatment: Attending groups: Yes. Participating in groups: Yes. Taking medication as prescribed: Yes. Toleration medication: Yes. Family/Significant other contact made: No, will contact:  Pt declined consent Patient understands diagnosis: No. Discussing patient identified problems/goals with staff: Yes. Medical problems stabilized or resolved: Yes. Denies suicidal/homicidal ideation: Yes. Issues/concerns per patient self-inventory: No. Other: none  New problem(s) identified: No, Describe:  none  New Short Term/Long Term Goal(s):  Patient Goals:  "address my issues that led to my depression: no good place to live, trouble getting disability, don't know where by children."  Discharge Plan or Barriers: CSW will continue to assess for appropriate referrals and possible discharge planning.   Reason for Continuation of Hospitalization: Delusions  Depression Hallucinations Medication stabilization  Estimated Length of Stay: 2-3 days   Attendees: Patient: 06/10/2018   Physician: Dr Altamese Mellott, MD; Dr. Arlys John  06/10/2018   Nursing: Dossie Arbour, RN; Lanora Manis.Val Eagle, RN 06/10/2018   RN Care Manager: 06/10/2018   Social Worker: Daleen Squibb, LCSW; Baldo Daub, LCSWA 06/10/2018   Recreational Therapist: X 06/10/2018   Other:  06/10/2018   Other:  06/10/2018   Other: 06/10/2018                Scribe for Treatment Team: Maeola Sarah, LCSWA 06/15/2018 10:46 AM

## 2018-06-15 NOTE — Progress Notes (Signed)
D: Pt denies SI/HI/AVH. Pt kept to room mostly this evening, pt said she was ready to go. A: Pt was offered support and encouragement. Pt was given scheduled medications. Pt was encourage to attend groups. Q 15 minute checks were done for safety.  R: safety maintained on unit.

## 2018-06-15 NOTE — Care Management (Signed)
CMA spoke with Tahleal from [facility]. Jefm Petty stated he spoke with patient on Friday regarding her discharge plans. He told patient he would contact [facility] for her but patient declined the offer. She stated someone from [place] would pick her up. Tahleal also contacted a few shelters for patient, but no beds were available.   CMA informed CSW of encounter with Liam Graham Kahne Helfand Care Management Assistant  Email:[email or web address] Office: [phone]

## 2018-06-15 NOTE — Care Management (Signed)
CMA followed up with [facility] in regards to patient's pick up that was scheduled on Friday. [facility] stated they contacted patient for a direct place to take her. Patient stated "I will wait for someone in [place] to pick me up." [facility] can not pick up patient without a direct place to drop her off at.   CMA informed CSW. CSW will speak with patient.   Kyran Connaughton Care Management Assistant  Email:[email or web address] Office: [phone]
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
## 2018-06-15 NOTE — Progress Notes (Signed)
Recreation Therapy Notes  Date: 11.4.19 Time: 1000 Location: 500 Hall Dayroom  Group Topic: Coping Skills  Goal Area(s) Addresses:  Patient will be able to identify positive coping skills. Patient will identify benefits of using coping skills post d/c.  Behavioral Response: Engaged  Intervention: Worksheet  Activity: Mind map.  LRT and patients filled in the first 8 boxes of the mind map together (anxiety, depression, exhaustion, mood, frustration, stress, work and anger).  Individually, patients would come up with 3 coping skills for each situation.  The group would comeback together and LRT would fill in the coping skills on the board.  Education: Pharmacologist, Building control surveyor.   Education Outcome: Acknowledges understanding/In group clarification offered/Needs additional education.   Clinical Observations/Feedback: Pt was engaged throughout group.  Pt identified some of her coping skills as talk to a therapist, stretch, soothing music, problem solve, free writing, speak with a supervisor and walk away.    Caroll Rancher, LRT/CTRS       Caroll Rancher A 06/15/2018 11:21 AM

## 2018-06-15 NOTE — Care Management (Signed)
CMA spoke with patient to get an exact address for discharge and for TCT. She stated she spoke with TCT on Friday, they agreed to pick her up on Monday (11/4) and [facility] a [facility] in [place]. CMA has tried to contact TCT several times with no answer.  CMA has reached out to Women shelters in [place], no beds are currently available for patient.   Jancarlo Biermann Care Management Assistant  Email:[email or web address] Office: [phone]

## 2018-06-15 NOTE — Progress Notes (Signed)
DAR NOTE: Patient presents with anxious affect and depressed mood.  Denies suicidal thoughts, pain, auditory and visual hallucinations.  Described energy level as normal and concentration as good.  Rates depression at 1, hopelessness at 0, and anxiety at 2.  Maintained on routine safety checks.  Medications given as prescribed.  Support and encouragement offered as needed.  Attended group and participated.  Patient observed socializing with peers in the dayroom.  Offered no complaint.

## 2018-06-15 NOTE — BHH Suicide Risk Assessment (Signed)
[facility] Discharge Suicide Risk Assessment   Principal Problem: Schizoaffective disorder, bipolar type [facility]) Discharge Diagnoses:  Patient Active Problem List   Diagnosis Date Noted  . Suicidal ideation [R45.851]   . Schizoaffective disorder, bipolar type (HCC) [F25.0]   . Depression [F32.9] 05/21/2018    Total Time spent with patient: 20 minutes  Musculoskeletal: Strength & Muscle Tone: within normal limits Gait & Station: normal Patient leans: Right  Psychiatric Specialty Exam: ROS  Blood pressure 124/83, pulse 96, temperature (!) 97.4 F (36.3 C), temperature source Oral, resp. rate 20, height 5\' 3"  (1.6 m), weight 61.2 kg, SpO2 100 %.Body mass index is 23.91 kg/m.  General Appearance: Casual  Eye Contact::  Good  Speech:  Clear and Coherent409  Volume:  Normal  Mood:  Euthymic  Affect:  Congruent  Thought Process:  Coherent  Orientation:  Full (Time, Place, and Person)  Thought Content:  Logical  Suicidal Thoughts:  No  Homicidal Thoughts:  No  Memory:  Immediate;   Good  Judgement:  Good  Insight:  Good  Psychomotor Activity:  EPS none  Concentration:  Good  Recall:  Good  Fund of Knowledge:Good  Language: Good  Akathisia:  Negative  Handed:  Right  AIMS (if indicated):     Assets:  Communication Skills  Sleep:  Number of Hours: 4.75  Cognition: WNL  ADL's:  Intact   Mental Status Per Nursing Assessment::   On Admission:  Suicidal ideation indicated by patient, Suicide plan, Self-harm thoughts, Intention to act on suicide plan, Belief that plan would result in death  Demographic Factors:  NA  Loss Factors: NA  Historical Factors: NA  Risk Reduction Factors:   Positive therapeutic relationship and Positive coping skills or problem solving skills  Continued Clinical Symptoms:  Severe Anxiety and/or Agitation resolved  Cognitive Features That Contribute To Risk:  None    Suicide Risk:  Minimal: No identifiable suicidal ideation.  Patients  presenting with no risk factors but with morbid ruminations; may be classified as minimal risk based on the severity of the depressive symptoms  Follow-up Information    [facility] Follow up.   Specialty:  Behavioral Health Why:  Your Transition Care Team will pick you up from the hospital to initiate services. Contact information: [address] [place] [address] [phone]           Plan Of Care/Follow-up recommendations:  Activity:  full  Marylee Belzer, MD 06/15/2018, 10:40 AM

## 2018-06-16 ENCOUNTER — Other Ambulatory Visit: Payer: Self-pay

## 2018-06-16 NOTE — BHH Suicide Risk Assessment (Signed)
[facility] Discharge Suicide Risk Assessment   Principal Problem: Schizoaffective disorder, bipolar type [facility]) Discharge Diagnoses:  Patient Active Problem List   Diagnosis Date Noted  . Suicidal ideation [R45.851]   . Schizoaffective disorder, bipolar type (HCC) [F25.0]   . Depression [F32.9] 05/21/2018    Total Time spent with patient: 30 minutes  Musculoskeletal: Strength & Muscle Tone: within normal limits Gait & Station: normal Patient leans: N/A  Psychiatric Specialty Exam: ROS  Blood pressure 120/82, pulse 77, temperature (!) 97.4 F (36.3 C), temperature source Oral, resp. rate 20, height 5\' 3"  (1.6 m), weight 61.2 kg, SpO2 100 %.Body mass index is 23.91 kg/m.  General Appearance: Casual  Eye Contact::  Good  Speech:  Clear and Coherent409  Volume:  Normal  Mood:  Euthymic  Affect:  Constricted  Thought Process:  Coherent  Orientation:  Full (Time, Place, and Person)  Thought Content:  Logical  Suicidal Thoughts:  no  Homicidal Thoughts:  No  Memory:  Immediate;   Good  Judgement:  Good  Insight:  Good  Psychomotor Activity:  Normal  Concentration:  Good  Recall:  Good  Fund of Knowledge:Good  Language: Good  Akathisia:  Negative  Handed:  Right  AIMS (if indicated):     Assets:  Communication Skills  Sleep:  Number of Hours: 7.25  Cognition: WNL  ADL's:  Intact   Mental Status Per Nursing Assessment::   On Admission:  Suicidal ideation indicated by patient, Suicide plan, Self-harm thoughts, Intention to act on suicide plan, Belief that plan would result in death  Demographic Factors:  NA  Loss Factors: Financial problems/change in socioeconomic status  Historical Factors: NA  Risk Reduction Factors:   Positive coping skills or problem solving skills  Continued Clinical Symptoms:  Medical Diagnoses and Treatments/Surgeries  Cognitive Features That Contribute To Risk:  None    Suicide Risk:  Minimal: No identifiable suicidal ideation.  Patients  presenting with no risk factors but with morbid ruminations; may be classified as minimal risk based on the severity of the depressive symptoms  Follow-up Information    [facility] Follow up.   Specialty:  Behavioral Health Why:  Your Transition Care Team will pick you up from the hospital to initiate services. Contact information: [address] [place] [address] [phone]           Plan Of Care/Follow-up recommendations:  Activity:  full  Toure Edmonds, MD 06/16/2018, 9:17 AM

## 2018-06-16 NOTE — Progress Notes (Signed)
  [facility] Adult Case Management Discharge Plan :  Will you be returning to the same living situation after discharge:  No. Pt will be [facility] to [facility] for shelter placement by TCT. At discharge, do you have transportation home?: Yes,  [facility] Transition Care Team Do you have the ability to pay for your medications: Yes,  [employer]  Release of information consent forms completed and in the chart;  Patient's signature needed at discharge.  Patient to Follow up at: Follow-up Information    [facility] Follow up.   Specialty:  Behavioral Health Why:  Your Transition Care Team will pick you up from the hospital to initiate services. Contact information: [address] [place] [address] [phone]           Next level of care provider has access to [facility] Link:no  Safety Planning and Suicide Prevention discussed: No. Pt refused.  Have you used any form of tobacco in the last 30 days? (Cigarettes, Smokeless Tobacco, Cigars, and/or Pipes): No  Has patient been referred to the Quitline?: N/A patient is not a smoker  Patient has been referred for addiction treatment: N/A  Lorri Frederick, LCSW 06/16/2018, 9:14 AM

## 2018-06-16 NOTE — Progress Notes (Signed)
Recreation Therapy Notes  Date: 11.5.19 Time: 1000 Location: 500 Hall Dayroom  Group Topic: Wellness  Goal Area(s) Addresses:  Patient will define components of whole wellness. Patient will verbalize benefit of whole wellness.  Behavioral Response: Engaged  Intervention: Music  Activity:  Exercise.  LRT lead group in a series of stretches to get them warmed up to complete the exercises.  Each patient would then lead the group in an exercise of their choice.  The group would complete 30 minutes of exercise.  Patients could take water break when needed.  Education: Wellness, Building control surveyor.   Education Outcome: Acknowledges education/In group clarification offered/Needs additional education.   Clinical Observations/Feedback: Pt was active and engaged.  Pt completed exercises.  Pt took breaks when needed.  Pt was smiling and engaged throughout activity.    Caroll Rancher, LRT/CTRS     Lillia Abed, Makala Fetterolf A 06/16/2018 11:03 AM

## 2018-06-16 NOTE — Progress Notes (Signed)
Patient discharged to lobby. Patient was stable and appreciative at that time. All papers and prescriptions were given and valuables returned. Verbal understanding expressed. Denies SI/HI and A/VH. Patient given opportunity to express concerns and ask questions.  

## 2018-06-16 NOTE — Patient Outreach (Signed)
[facility]) Care Management  06/16/2018  Patricia [name] 05-29-1971 [id number]  Transition of care  Referral date: 06/11/18 Referral source: [place] behavioral health Insurance: health team advantage Attempt #2  Telephone call to patient regarding transition of care follow up. Attempted call to listed home number.  Unable to reach or leave voice message.  Message states voice mail box is not set up.  Attempted call to listed mobile number.   Number did not ring.   RNCM contacted referral source, Patricia [name], Child psychotherapist. Social worker stated patient is still admitted to the hospital with a potential discharge for today. He states patient will be leaving the hospital homeless.  Social worker states patient is scheduled to follow up with the Transitional care team program with [place], contact person. Patricia [name], peer support specialist [phone]).  Social worker requested RNCM contact Patricia [name] to determine if patient is participating in program and to attempt contact with patient.   PLAN: RNCM will attempt outreach to patient and/ or Patricia [name] within 4 business days.   George Ina RN,BSN,CCM [facility] Telephonic  [phone]
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
## 2018-06-16 NOTE — Progress Notes (Addendum)
CSW spoke to [place] from [facility].  When he came to pick pt up Friday, he had been unable to locate any shelter beds and told pt he would be taking her to [facility].  Pt then told him that a family member from [place] was coming to get her and she did not want to leave with him but rather wait for the family member.  That was the last he has heard from her.  He has to check on who can pick her up today.  Plan would still be [facility].  He will call back with a time they can pick her up. Garner Nash, MSW, LCSW Clinical Social Worker 06/16/2018 9:06 AM   CSW called [facility] multiple times trying to determine when they can pick pt up.  No response to messages or texts.  CSW called Samantha at the main office and no response there either.  Garner Nash, MSW, LCSW Clinical Social Worker 06/16/2018 2:57 PM   Judeth Cornfield from [facility] came and picked up pt to take to [facility]. Garner Nash, MSW, LCSW Clinical Social Worker 06/16/2018 3:08 PM

## 2018-06-16 NOTE — BHH Group Notes (Signed)
BHH LCSW Group Therapy Note  Date/Time: 06/16/18, 1100  Type of Therapy/Topic:  Group Therapy:  Feelings about Diagnosis  Participation Level:  Minimal   Mood:pleasant   Description of Group:    This group will allow patients to explore their thoughts and feelings about diagnoses they have received. Patients will be guided to explore their level of understanding and acceptance of these diagnoses. Facilitator will encourage patients to process their thoughts and feelings about the reactions of others to their diagnosis, and will guide patients in identifying ways to discuss their diagnosis with significant others in their lives. This group will be process-oriented, with patients participating in exploration of their own experiences as well as giving and receiving support and challenge from other group members.   Therapeutic Goals: 1. Patient will demonstrate understanding of diagnosis as evidence by identifying two or more symptoms of the disorder:  2. Patient will be able to express two feelings regarding the diagnosis 3. Patient will demonstrate ability to communicate their needs through discussion and/or role plays  Summary of Patient Progress:Pt was attentive during group but did not participate very much.  Pt was able to describe and share that she recognized several symptoms of schizophrenia such as auditory and visual hallucinations.         Therapeutic Modalities:   Cognitive Behavioral Therapy Brief Therapy Feelings Identification   Daleen Squibb, LCSW

## 2018-06-19 ENCOUNTER — Other Ambulatory Visit: Payer: Self-pay

## 2018-06-19 NOTE — Patient Outreach (Signed)
[facility]) Care Management  06/19/2018  Shai L [name] August 01, 1971 [id number]  Transition of care  Referral date: 06/11/18 Referral source: [place] behavioral health Insurance: health team advantage  Per chart patient discharged from the hospital on 06/16/18 Telephone call to patient regarding transition of care referral.  Attempted call to listed telephone number.  Number does not ring. Call to Hortencia Pilar, peer support specialist with the transitional care team. Message left requesting return call.   PLAN: RNCM will attempt 2nd telephone outreach to [employer] within 4 business days.  Unable to send patient outreach letter due to homeless status.   George Ina RN,BSN,CCM [facility] Telephonic  [phone]

## 2018-06-25 ENCOUNTER — Ambulatory Visit: Payer: Self-pay

## 2018-07-01 ENCOUNTER — Other Ambulatory Visit: Payer: Self-pay

## 2018-07-02 NOTE — Patient Outreach (Signed)
[facility] Care Management  07/02/2018  Celita L [name] [birth year] [id number]  Late entry for 07/01/18:  Telephone call to Hortencia Pilarahleal Garret, peer support specialist with the transitional care team. Message left requesting return call.   PLAN: RNCM will attempt 3rdtelephone outreach to [employer] Garret within 4 business days.    George InaDavina Blane Worthington RN,BSN,CCM [facility] Telephonic  [phone]

## 2018-07-03 ENCOUNTER — Other Ambulatory Visit: Payer: Self-pay

## 2018-07-03 NOTE — Patient Outreach (Signed)
[facility] Care Management  07/03/2018  Windy Fastdriene L [name] 20-Aug-1970 [id number]  Transition of care  Referral date:06/11/18 Referral source: behavioral health Insurance:health team advantage  Received a return voice mail message from [place] with [employer] requesting return call.  Telephone call to Marylene Landngela and patient at [facility] [phone].  RNCM was able to speak with patient. Explained reason for call.  Patient states she is not sure if she has Health team advantage insurance anymore because she has been unable to pay for it. Patient states she does not know where her health team advantage card is. Patient [facility]. Patient reports she does not have any income.  Patient states she does not have a phone.  Patient reports she has had a follow up visit with her doctor since discharge from the hospital. Patient states she has a case worker with [facility]. Patient states her case worker assist her with bus passes for transportation to her appointments and is working with her on housing. Patient states her disability has been suspended.  RNCM discussed and offered [facility] care [facility] services. Patient declined services stating  her case worker is helping her with her situation.  RNCM gave patient [facility] name and number to contact if additional assistance is needed. Patient verbalized understanding. Patient states she has not way to be contacted at this time.    PLAN; RNCM provided patient her name and direct contact number to call if additional assistance is needed.  RNCM will send closure letter to patients primary MD   Patricia InaDavina Othell Diluzio RN,BSN,CCM [facility] Telephonic  [phone]

## 2018-07-03 NOTE — Congregational Nurse Program (Signed)
Assisted Warden/rangersocial worker intern with case management.  Contacted social services to determine what has happened that her disability checks have been stopped.  Client has moved and did not give a forwarding address.  Gave the social work intern [facility] information so they can follow through.  Instructed intern to also notify the case manager at [facility] to update them about the situation.

## 2018-07-07 ENCOUNTER — Ambulatory Visit: Payer: Self-pay

## 2018-07-16 NOTE — Congregational Nurse Program (Unsigned)
Referred to social work Tax inspectorintern for case managment

## 2018-08-20 ENCOUNTER — Emergency Department (HOSPITAL_COMMUNITY)
Admission: EM | Admit: 2018-08-20 | Discharge: 2018-08-20 | Disposition: A | Discharge status: Home / Self Care | Payer: PPO | Attending: Emergency Medicine | Admitting: Emergency Medicine

## 2018-08-20 ENCOUNTER — Encounter (HOSPITAL_COMMUNITY): Payer: Self-pay | Admitting: Emergency Medicine

## 2018-08-20 DIAGNOSIS — Z5321 Procedure and treatment not carried out due to patient leaving prior to being seen by health care provider: Secondary | ICD-10-CM | POA: Insufficient documentation

## 2018-08-20 DIAGNOSIS — Z046 Encounter for general psychiatric examination, requested by authority: Secondary | ICD-10-CM | POA: Insufficient documentation

## 2018-08-20 DIAGNOSIS — Z590 Homelessness: Secondary | ICD-10-CM | POA: Diagnosis not present

## 2018-08-20 NOTE — ED Notes (Signed)
No answer for room x3. Pt no where to be found

## 2018-08-20 NOTE — ED Triage Notes (Addendum)
Pt arrives via [employer] with IVC papers from the DSS; however paperwork has the incorrect name/spelling. [employer] working on correct paperwork. Pt went to the DSS office today thinking she is 48 years old, wanting to be emancipated from her parents, pt thinks her name is "Patricia [name]" and thinks she is her daughter.  Pt is homeless. Good hygiene. Pt denies SI/HI

## 2018-08-20 NOTE — ED Notes (Signed)
Pt refusing labs

## 2018-08-20 NOTE — ED Notes (Signed)
[employer] states that it was not DSS that obtained IVC papers on pt. It was a mobile crisis center. [employer] trying to obtain correct paperwork with pt's correct name. At this time, IVC paperwork is void. Pt is not SI/HI. Pt cooperative at this time. [employer] unable to get in touch with anyone to get correct paperwork. According to [employer], pt can leave if she wishes at this time.

## 2018-09-23 ENCOUNTER — Other Ambulatory Visit: Payer: Self-pay | Admitting: Critical Care Medicine

## 2018-09-23 ENCOUNTER — Encounter: Payer: Self-pay | Admitting: Critical Care Medicine

## 2018-09-23 MED ORDER — CETIRIZINE HCL 10 MG PO TABS: 10.0000 mg | ORAL_TABLET | Freq: Every day | ORAL | 0 refills | Status: DC

## 2018-09-23 NOTE — Progress Notes (Signed)
[facility]  [facility]

## 2018-09-24 ENCOUNTER — Encounter: Payer: Self-pay | Admitting: Critical Care Medicine

## 2018-09-24 MED FILL — CETIRIZINE HCL 10 MG TABS: 10 | 30 days supply | Qty: 30 | Fill #0

## 2018-09-24 NOTE — Progress Notes (Signed)
This is a [age] year old female seen in the [facility] for a [facility].  She was in the emergency room several weeks ago and treated with antibiotics.  She thought she was told she had pneumonia.  Note we cannot find any documentation in the system for any emergency room visits.  She complains of yellow mucus coming from her nose no wheezing slight postnasal drainage and symptoms are they are continuing.  She last saw Phillips OdorMarianne Place[facility].  Note she can no longer go back to that clinic as she is now acquiring Medicaid.   Note allergies include amoxicillin doxycycline and latex On exam vital signs stable chest was clear cardiac exam showed a regular rate and rhythm abdomen was soft nontender extremities show no edema  HEENT exam showed nasal turbinate edema postnasal drainage but no purulence  Impression is that of ongoing allergic rhinitis note she has a prescription for loratadine but is about to run out of this  Recommendations Will prescribe Zyrtec 10 mg daily to replace the loratadine  no additional steroids or antibiotics are indicated at this time

## 2018-09-24 NOTE — Congregational Nurse Program (Signed)
Dr.Wright saw this client at [facility] 2/12. Mrs.Jones is pt.of MaryAnn but presents with c/o allergic rhinitis. [facility] sent to [facility] and CN will pick up

## 2018-10-07 ENCOUNTER — Encounter: Payer: Self-pay | Admitting: Critical Care Medicine

## 2018-10-07 ENCOUNTER — Other Ambulatory Visit: Payer: Self-pay | Admitting: Critical Care Medicine

## 2018-10-07 MED ORDER — FLUTICASONE PROPIONATE 50 MCG/ACT NA SUSP: 2.0000 | Freq: Every day | NASAL | 6 refills | Status: DC

## 2018-10-07 MED ORDER — TOPIRAMATE 100 MG PO TABS: 100.0000 mg | ORAL_TABLET | Freq: Every day | ORAL | 1 refills | Status: DC

## 2018-10-07 MED ORDER — CETIRIZINE HCL 10 MG PO TABS: 10.0000 mg | ORAL_TABLET | Freq: Every day | ORAL | 0 refills | Status: DC

## 2018-10-07 NOTE — Progress Notes (Signed)
Refills on certirizine /topirmate/ flonase
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
## 2018-10-19 ENCOUNTER — Ambulatory Visit: Payer: Self-pay | Admitting: Critical Care Medicine

## 2019-11-11 ENCOUNTER — Encounter (HOSPITAL_COMMUNITY): Payer: Self-pay | Admitting: Emergency Medicine

## 2019-11-11 ENCOUNTER — Emergency Department (HOSPITAL_COMMUNITY)
Admission: EM | Admit: 2019-11-11 | Discharge: 2019-11-12 | Disposition: A | Discharge status: Other Acute Inpatient Hospital | Payer: PPO | Attending: Emergency Medicine | Admitting: Emergency Medicine

## 2019-11-11 ENCOUNTER — Other Ambulatory Visit: Payer: Self-pay

## 2019-11-11 DIAGNOSIS — R45851 Suicidal ideations: Secondary | ICD-10-CM | POA: Insufficient documentation

## 2019-11-11 DIAGNOSIS — F332 Major depressive disorder, recurrent severe without psychotic features: Secondary | ICD-10-CM | POA: Diagnosis present

## 2019-11-11 DIAGNOSIS — Z20822 Contact with and (suspected) exposure to covid-19: Secondary | ICD-10-CM | POA: Insufficient documentation

## 2019-11-11 DIAGNOSIS — Z720 Tobacco use: Secondary | ICD-10-CM | POA: Insufficient documentation

## 2019-11-11 LAB — CBC
HCT: 38.6 % (ref 36.0–46.0)
Hemoglobin: 12.2 g/dL (ref 12.0–15.0)
MCH: 28.4 pg (ref 26.0–34.0)
MCHC: 31.6 g/dL (ref 30.0–36.0)
MCV: 89.8 fL (ref 80.0–100.0)
Platelets: 326 10*3/uL (ref 150–400)
RBC: 4.3 MIL/uL (ref 3.87–5.11)
RDW: 12.5 % (ref 11.5–15.5)
WBC: 5 10*3/uL (ref 4.0–10.5)
nRBC: 0 % (ref 0.0–0.2)

## 2019-11-11 LAB — ACETAMINOPHEN LEVEL: Acetaminophen (Tylenol), Serum: <10 ug/mL — ABNORMAL LOW (ref 10–30)

## 2019-11-11 LAB — COMPREHENSIVE METABOLIC PANEL
ALT: 23 U/L (ref 0–44)
AST: 30 U/L (ref 15–41)
Albumin: 3.8 g/dL (ref 3.5–5.0)
Alkaline Phosphatase: 79 U/L (ref 38–126)
Anion gap: 10 (ref 5–15)
BUN: 9 mg/dL (ref 6–20)
CO2: 27 mmol/L (ref 22–32)
Calcium: 9.2 mg/dL (ref 8.9–10.3)
Chloride: 106 mmol/L (ref 98–111)
Creatinine, Ser: 0.67 mg/dL (ref 0.44–1.00)
GFR calc Af Amer: >60 mL/min (ref >60)
GFR calc non Af Amer: >60 mL/min (ref >60)
Glucose, Bld: 113 mg/dL — ABNORMAL HIGH (ref 70–99)
Potassium: 3.3 mmol/L — ABNORMAL LOW (ref 3.5–5.1)
Sodium: 143 mmol/L (ref 135–145)
Total Bilirubin: 0.8 mg/dL (ref 0.3–1.2)
Total Protein: 7.2 g/dL (ref 6.5–8.1)

## 2019-11-11 LAB — ETHANOL: Alcohol, Ethyl (B): <10 mg/dL (ref <10)

## 2019-11-11 LAB — RESPIRATORY PANEL BY RT PCR (FLU A&B, COVID)
Influenza A by PCR: NEGATIVE
Influenza B by PCR: NEGATIVE
SARS Coronavirus 2 by RT PCR: NEGATIVE

## 2019-11-11 LAB — I-STAT BETA HCG BLOOD, ED (MC, WL, AP ONLY): I-stat hCG, quantitative: <5 m[IU]/mL (ref <5)

## 2019-11-11 LAB — SALICYLATE LEVEL: Salicylate Lvl: <7 mg/dL — ABNORMAL LOW (ref 7.0–30.0)

## 2019-11-11 MED ORDER — POTASSIUM CHLORIDE CRYS ER 20 MEQ PO TBCR
20.0000 meq | EXTENDED_RELEASE_TABLET | Freq: Once | ORAL | Status: DC
  Filled 2019-11-11: qty 1

## 2019-11-11 NOTE — BH Assessment (Signed)
Tele Assessment Note   Patient Name: Patricia [name] MRN: [id number] Referring Physician: Raeford Razor, MD Location of Patient: [facility] Location of Provider: Behavioral Health TTS Department  Patricia [name] is an 49 y.o. female who presents to the ED voluntarily. Pt reports she has been experiencing SI with a plan to starve herself. TTS asked the pt to identify her triggers causing her to feel suicidal and she begins to speak rapidly about "having five types of pneumonia and feeling dizzy and also had shortness of breath." Pt continues to express medical complaints but does not indicate if this is the trigger for her current SI. Pt states she has been admitted to inpt facilities in the past including [facility] due to paranoid schizophrenia. Pt currently denies AVH, however pt has hx of paranoid schizophrenia and appears disoriented to self at times as she states "I don't know my marital status." Pt states she wants to starve herself to kill herself, however states she last ate noodles before arriving to the ED. TTS asked the pt for collateral contacts and she states she does not have any supports. Pt denies a current MH provider.   Pt is alert during the assessment. Pt has the mask covering most of her face and almost to her eyes. Pt thought content is irrelevant and judgement is impaired. Pt's speech is pressured and rapid.    Lerry Liner, NP recommends inpt tx. BH is currently at capacity. TTS to seek placement. EDP Patricia Razor, MD and Patricia, Mickel Baas, RN have been advised.   Diagnosis: MDD, recurrent, severe, w/o psychosis  Past Medical History:  Past Medical History:  Diagnosis Date  . Depression     History reviewed. No pertinent surgical history.  Family History: No family history on file.  Social History:  reports that she has been smoking cigarettes and cigars. She has never used smokeless tobacco. She reports current alcohol use of about 1.0 standard drinks of  alcohol per week. She reports that she does not use drugs.  Additional Social History:  Alcohol / Drug Use Pain Medications: See MAR Prescriptions: See MAR Over the Counter: See MAR History of alcohol / drug use?: No history of alcohol / drug abuse  CIWA: CIWA-Ar BP: 129/75 Pulse Rate: 71 COWS:    Allergies:  Allergies  Allergen Reactions  . Oxycodone Shortness Of Breath and Palpitations  . Latex Itching and Other (See Comments)    "Makes me itch and burn"    Home Medications: (Not in a hospital admission)   OB/GYN Status:  No LMP recorded.  General Assessment Data Location of Assessment: [facility] ED TTS Assessment: In system Is this a Tele or Face-to-Face Assessment?: Tele Assessment Is this an Initial Assessment or a Re-assessment for this encounter?: Initial Assessment Patient Accompanied by:: N/A Language Other than English: No Living Arrangements: Other (Comment)(boarding house) What gender do you identify as?: Female Marital status: (pt states she does not know) Pregnancy Status: No Living Arrangements: Non-relatives/Friends Can pt return to current living arrangement?: Yes Admission Status: Voluntary Is patient capable of signing voluntary admission?: Yes Referral Source: Self/Family/Friend Insurance type: none     Crisis Care Plan Living Arrangements: Non-relatives/Friends Name of Psychiatrist: none  Name of Therapist: none  Education Status Is patient currently in school?: No Is the patient employed, unemployed or receiving disability?: Receiving disability income  Risk to self with the past 6 months Suicidal Ideation: Yes-Currently Present Has patient been a risk to self within the past 6 months prior  to admission? : Yes Suicidal Intent: Yes-Currently Present Has patient had any suicidal intent within the past 6 months prior to admission? : Yes Is patient at risk for suicide?: Yes Suicidal Plan?: Yes-Currently Present Has patient had any suicidal plan  within the past 6 months prior to admission? : Yes Specify Current Suicidal Plan: pt states she has a plan to starve herself Access to Means: Yes Specify Access to Suicidal Means: pt has the ability to starve herself by not eating What has been your use of drugs/alcohol within the last 12 months?: denies use Previous Attempts/Gestures: Yes How many times?: 1 Other Self Harm Risks: hx of suicide attempts Triggers for Past Attempts: Unpredictable Intentional Self Injurious Behavior: None Family Suicide History: No Recent stressful life event(s): Other (Comment)(schizophrenia) Persecutory voices/beliefs?: Yes Depression: Yes Depression Symptoms: Insomnia, Despondent Substance abuse history and/or treatment for substance abuse?: No Suicide prevention information given to non-admitted patients: Not applicable  Risk to Others within the past 6 months Homicidal Ideation: No Does patient have any lifetime risk of violence toward others beyond the six months prior to admission? : No Thoughts of Harm to Others: No Current Homicidal Intent: No Current Homicidal Plan: No Access to Homicidal Means: No History of harm to others?: No Assessment of Violence: None Noted Does patient have access to weapons?: No Criminal Charges Pending?: No Does patient have a court date: No Is patient on probation?: No  Psychosis Hallucinations: Auditory, Visual(per hx) Delusions: Unspecified(per hx)  Mental Status Report Appearance/Hygiene: Disheveled Eye Contact: Good Motor Activity: Restlessness Speech: Rapid, Pressured Level of Consciousness: Alert Mood: Anxious, Labile Affect: Preoccupied, Anxious Anxiety Level: Severe Thought Processes: Irrelevant Judgement: Impaired Orientation: Place, Time Obsessive Compulsive Thoughts/Behaviors: Minimal  Cognitive Functioning Concentration: Fair Memory: Remote Intact, Recent Intact Is patient IDD: No Insight: Poor Impulse Control: Poor Appetite:  Poor Have you had any weight changes? : No Change Sleep: Decreased Total Hours of Sleep: 2 Vegetative Symptoms: None  ADLScreening [facility] Assessment Services) Patient's cognitive ability adequate to safely complete daily activities?: Yes Patient able to express need for assistance with ADLs?: Yes Independently performs ADLs?: Yes (appropriate for developmental age)  Prior Inpatient Therapy Prior Inpatient Therapy: Yes Prior Therapy Dates: 2019, 2017 Prior Therapy Facilty/Provider(s): [facility]; [place] Reason for Treatment: Schizophrenia  Prior Outpatient Therapy Prior Outpatient Therapy: Yes Prior Therapy Dates: 2020 Prior Therapy Facilty/Provider(s): [facility] [place] Reason for Treatment: Group counseling Does patient have an ACCT team?: No Does patient have Intensive In-House Services?  : No Does patient have [facility] services? : No Does patient have P4CC services?: No  ADL Screening (condition at time of admission) Patient's cognitive ability adequate to safely complete daily activities?: Yes Is the patient deaf or have difficulty hearing?: No Does the patient have difficulty seeing, even when wearing glasses/contacts?: No Does the patient have difficulty concentrating, remembering, or making decisions?: No Patient able to express need for assistance with ADLs?: Yes Does the patient have difficulty dressing or bathing?: No Independently performs ADLs?: Yes (appropriate for developmental age) Does the patient have difficulty walking or climbing stairs?: No Weakness of Legs: None Weakness of Arms/Hands: None  Home Assistive Devices/Equipment Home Assistive Devices/Equipment: None    Abuse/Neglect Assessment (Assessment to be complete while patient is alone) Abuse/Neglect Assessment Can Be Completed: Yes Physical Abuse: Denies Verbal Abuse: Denies Sexual Abuse: Denies Exploitation of patient/patient's resources:  Denies Self-Neglect: Denies     Regulatory affairs officer (For Healthcare) Does Patient Have a Medical Advance  Directive?: No Would patient like information on creating a medical advance directive?: No - Patient declined          Disposition:  Lerry Liner, NP recommends inpt tx. BH is currently at capacity. TTS to seek placement. EDP Patricia Razor, MD and Patricia, Mickel Baas, RN have been advised.  Disposition Initial Assessment Completed for this Encounter: Yes Disposition of Patient: Admit Type of inpatient treatment program: Adult Patient refused recommended treatment: No  This service was provided via telemedicine using a 2-way, interactive audio and video technology.  Names of all persons participating in this telemedicine service and their role in this encounter. Name: Windy Fast Role: Patient  Name: Princess Bruins, [place] Role: TTS  Name: Lerry Liner, NP Role: [facility] Ed Provider       [name] 11/11/2019 11:45 PM

## 2019-11-11 NOTE — ED Provider Notes (Signed)
[place] EMERGENCY DEPARTMENT Provider Note   CSN: [id number] Arrival date & time: 11/11/19  2010     History Chief Complaint  Patient presents with  . Suicidal    Patricia [name] is a 49 y.o. female.  HPI   49 year old female with depression and suicidal thoughts.  No clear plan.  She is not sure why she has been increasingly depressed recently.  Denies any substance abuse.  No hallucinations.  Denies wanting harm anybody else.  History of depression on Zoloft.  Past Medical History:  Diagnosis Date  . Depression     Patient Active Problem List   Diagnosis Date Noted  . Suicidal ideation   . Schizoaffective disorder, bipolar type ([place])   . Depression 05/21/2018    History reviewed. No pertinent surgical history.   OB History   No obstetric history on file.     No family history on file.  Social History   Tobacco Use  . Smoking status: Current Some Day Smoker    Types: Cigarettes, Cigars  . Smokeless tobacco: Never Used  Substance Use Topics  . Alcohol use: Yes    Alcohol/week: 1.0 standard drinks    Types: 1 Glasses of wine per week    Comment: social   . Drug use: No    Home Medications Prior to Admission medications   Medication Sig Start Date End Date Taking? Authorizing Provider  cetirizine (ZYRTEC) 10 MG tablet Take 1 tablet (10 mg total) by mouth daily. Patient not taking: Reported on 11/11/2019 10/07/18   Elsie Stain, MD  fluticasone [facility]) 50 MCG/ACT nasal spray Place 2 sprays into both nostrils daily. Patient not taking: Reported on 11/11/2019 10/07/18   Elsie Stain, MD  sertraline (ZOLOFT) 50 MG tablet Take 1 tablet (50 mg total) by mouth daily. Patient not taking: Reported on 11/11/2019 06/13/18   Pennelope Bracken, MD  topiramate (TOPAMAX) 100 MG tablet Take 1 tablet (100 mg total) by mouth at bedtime. Patient not taking: Reported on 11/11/2019 10/07/18   Elsie Stain, MD    Allergies      Oxycodone and Latex  Review of Systems   Review of Systems I have reviewed the patient's medical history in detail and updated the computerized patient record.Physical Exam Updated Vital Signs BP 129/75 (BP Location: Left Arm)   Pulse 71   Temp 98.7 F (37.1 C) (Oral)   Resp 18   Ht 5\' 3"  (1.6 m)   Wt 72.6 kg   SpO2 98%   BMI 28.34 kg/m   Physical Exam Vitals and nursing note reviewed.  Constitutional:      General: She is not in acute distress.    Appearance: She is well-developed.  HENT:     Head: Normocephalic and atraumatic.  Eyes:     General:        Right eye: No discharge.        Left eye: No discharge.     Conjunctiva/sclera: Conjunctivae normal.  Cardiovascular:     Rate and Rhythm: Normal rate and regular rhythm.     Heart sounds: Normal heart sounds. No murmur. No friction rub. No gallop.   Pulmonary:     Effort: Pulmonary effort is normal. No respiratory distress.     Breath sounds: Normal breath sounds.  Abdominal:     General: There is no distension.     Palpations: Abdomen is soft.     Tenderness: There is no abdominal tenderness.  Musculoskeletal:  General: No tenderness.     Cervical back: Neck supple.  Skin:    General: Skin is warm and dry.  Neurological:     Mental Status: She is alert.  Psychiatric:     Comments: Flat affect. Fair eye contact. Crying at times.      ED Results / Procedures / Treatments   Labs (all labs ordered are listed, but only abnormal results are displayed) Labs Reviewed  COMPREHENSIVE METABOLIC PANEL - Abnormal; Notable for the following components:      Result Value   Potassium 3.3 (*)    Glucose, Bld 113 (*)    All other components within normal limits  SALICYLATE LEVEL - Abnormal; Notable for the following components:   Salicylate Lvl <7.0 (*)    All other components within normal limits  ACETAMINOPHEN LEVEL - Abnormal; Notable for the following components:   Acetaminophen (Tylenol), Serum <10 (*)     All other components within normal limits  RESPIRATORY PANEL BY RT PCR (FLU A&B, COVID)  ETHANOL  CBC  RAPID URINE DRUG SCREEN, HOSP PERFORMED  I-STAT BETA HCG BLOOD, ED ([facility], [facility], [facility] ONLY)    EKG None  Radiology No results found.  Procedures Procedures (including critical care time)  Medications Ordered in ED Medications - No data to display  ED Course  I have reviewed the triage vital signs and the nursing notes.  Pertinent labs & imaging results that were available during my care of the patient were reviewed by me and considered in my medical decision making (see chart for details).    MDM Rules/Calculators/A&P                      49 year old female with depression and passive suicidal ideation.  Medically clear.  Disposition per TTS recommendations.  Final Clinical Impression(s) / ED Diagnoses Final diagnoses:  Suicidal thoughts    Rx / DC Orders ED Discharge Orders    None       Raeford Razor, MD 11/13/19 [phone]

## 2019-11-11 NOTE — Progress Notes (Signed)
TTS spoke with Britt Boozer, RN who requested 10 mins to set up telepsych cart.

## 2019-11-11 NOTE — ED Triage Notes (Signed)
Pt presents stating she is suicidal, no specific plan. Pt appears tearful in triage.   Belonging removed, placed in scrubs. Pt refusing to remove rings. Wanded by security.

## 2019-11-12 ENCOUNTER — Encounter (HOSPITAL_COMMUNITY): Payer: Self-pay | Admitting: Psychiatry

## 2019-11-12 ENCOUNTER — Inpatient Hospital Stay (HOSPITAL_COMMUNITY)
Admission: AD | Admit: 2019-11-12 | Discharge: 2019-11-19 | DRG: 885 | Disposition: A | Discharge status: Home / Self Care | Payer: Medicare Other | Source: Intra-hospital | Attending: Psychiatry | Admitting: Psychiatry

## 2019-11-12 ENCOUNTER — Other Ambulatory Visit: Payer: Self-pay

## 2019-11-12 DIAGNOSIS — Z885 Allergy status to narcotic agent status: Secondary | ICD-10-CM | POA: Diagnosis not present

## 2019-11-12 DIAGNOSIS — Z91040 Latex allergy status: Secondary | ICD-10-CM | POA: Diagnosis not present

## 2019-11-12 DIAGNOSIS — F332 Major depressive disorder, recurrent severe without psychotic features: Secondary | ICD-10-CM | POA: Diagnosis present

## 2019-11-12 DIAGNOSIS — R45851 Suicidal ideations: Secondary | ICD-10-CM | POA: Diagnosis present

## 2019-11-12 DIAGNOSIS — G4700 Insomnia, unspecified: Secondary | ICD-10-CM | POA: Diagnosis present

## 2019-11-12 DIAGNOSIS — N951 Menopausal and female climacteric states: Secondary | ICD-10-CM | POA: Diagnosis present

## 2019-11-12 DIAGNOSIS — F17210 Nicotine dependence, cigarettes, uncomplicated: Secondary | ICD-10-CM | POA: Diagnosis present

## 2019-11-12 DIAGNOSIS — F250 Schizoaffective disorder, bipolar type: Principal | ICD-10-CM | POA: Diagnosis present

## 2019-11-12 LAB — RAPID URINE DRUG SCREEN, HOSP PERFORMED
Amphetamines: NOT DETECTED
Barbiturates: NOT DETECTED
Benzodiazepines: NOT DETECTED
Cocaine: NOT DETECTED
Opiates: NOT DETECTED
Tetrahydrocannabinol: NOT DETECTED

## 2019-11-12 MED ORDER — CLONIDINE HCL 0.1 MG PO TABS: 0.2000 mg | ORAL_TABLET | Freq: Four times a day (QID) | ORAL | Status: DC | PRN

## 2019-11-12 MED ORDER — ALUM & MAG HYDROXIDE-SIMETH 200-200-20 MG/5ML PO SUSP: 30.0000 mL | ORAL | Status: DC | PRN

## 2019-11-12 MED ORDER — TEMAZEPAM 30 MG PO CAPS
30.0000 mg | ORAL_CAPSULE | Freq: Every day | ORAL | Status: DC
  Administered 2019-11-16 – 2019-11-18 (×3): 30 mg via ORAL
  Filled 2019-11-12 (×3): qty 1

## 2019-11-12 MED ORDER — ACETAMINOPHEN 325 MG PO TABS: 650.0000 mg | ORAL_TABLET | Freq: Four times a day (QID) | ORAL | Status: DC | PRN

## 2019-11-12 MED ORDER — LORAZEPAM 1 MG PO TABS: 2.0000 mg | ORAL_TABLET | ORAL | Status: DC | PRN

## 2019-11-12 MED ORDER — OLANZAPINE 10 MG PO TBDP
10.0000 mg | ORAL_TABLET | Freq: Two times a day (BID) | ORAL | Status: DC
  Administered 2019-11-12: 18:00:00 10 mg via ORAL
  Filled 2019-11-12 (×11): qty 1

## 2019-11-12 MED ORDER — MAGNESIUM HYDROXIDE 400 MG/5ML PO SUSP: 30.0000 mL | Freq: Every day | ORAL | Status: DC | PRN

## 2019-11-12 MED ORDER — LORAZEPAM 2 MG/ML IJ SOLN: 2.0000 mg | INTRAMUSCULAR | Status: DC | PRN

## 2019-11-12 NOTE — ED Notes (Signed)
Requested for diet order to be placed for pt. Orders placed by MD, breakfast tray ordered by sitter.

## 2019-11-12 NOTE — Progress Notes (Signed)
Pt accepted to [facility]; bed 502-1    Fransisca Kaufmann, NP is the accepting provider.    Dr. Jeannine Kitten is the attending provider.    Call report to [phone]    April @ [facility] ED notified.     Pt is voluntary and will be transported by [employer]  Pt is scheduled to arrive at [facility] at 1pm.   Wells Guiles, LCSW, LCAS Disposition CSW [facility]/TTS [phone]

## 2019-11-12 NOTE — H&P (Signed)
Psychiatric Admission Assessment Adult  Patient Identification: Patricia [name] MRN:  [id number] Date of Evaluation:  11/12/2019 Chief Complaint:  MDD, recurrent, severe. Principal Diagnosis: Exacerbation of schizoaffective disorder Diagnosis:  Active Problems:   Schizoaffective disorder, bipolar type (HCC)  History of Present Illness:   This is a repeat admission for Patricia [name], she is 49 years of age and is known to have a schizoaffective type condition.  She presented 4/1 reporting she had suicidal thoughts, further presentation revealed mania, disjointed statements and denies and variably delusional thoughts.  On my evaluation she is alert and she is oriented to person place and general situation, she is generally restless she states she is here "because I had 4 bleeding concussions, I had pneumonia, I am having hot flashes, I have suicidal ideation, I lost my car keys." so she is generally all over the map and her speech is rambling and pressured and again very disjointed.  Her statements are generally random.  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 She denies drug use or auditory or visual hallucinations.  she has frequently reported suicidal thoughts without plans or intent and that appears to be the situation today. Laboratory work is nondiagnostic drug screen negative  She had her last admission here in November 2019 treated with olanzapine as her primary antipsychotic, had an ER visit in November 2019 and February 2020 as well.  When she was petition in January 2020 she actually eloped from the emergency department. Clearly may need long-acting injectable   Associated Signs/Symptoms: Depression Symptoms:  insomnia, psychomotor agitation, (Hypo) Manic Symptoms:  Delusions, Distractibility, Flight of Ideas, Anxiety Symptoms:  n/a Psychotic Symptoms: Delusional beliefs disorganized thought PTSD Symptoms: NA Total Time spent with patient: 45  minutes  Past Psychiatric History: Extensive and poor compliance is an issue  Is the patient at risk to self? Yes.    Has the patient been a risk to self in the past 6 months? Yes.    Has the patient been a risk to self within the distant past? Yes.    Is the patient a risk to others? No.  Has the patient been a risk to others in the past 6 months? No.  Has the patient been a risk to others within the distant past? No.   Prior Inpatient Therapy:  Generally lost to outpatient therapy has been referred to [facility] Prior Outpatient Therapy:  Last here in November 2019  Alcohol Screening:   Substance Abuse History in the last 12 months:  No. Consequences of Substance Abuse: NA Previous Psychotropic Medications: Yes  Psychological Evaluations: No  Past Medical History:  Past Medical History:  Diagnosis Date  . Depression    No past surgical history on file. Family History: No family history on file. Family Psychiatric  History: See eval patient states that only dementia runs in her family Tobacco Screening:   Social History:  Social History   Substance and Sexual Activity  Alcohol Use Yes  . Alcohol/week: 1.0 standard drinks  . Types: 1 Glasses of wine per week   Comment: social      Social History   Substance and Sexual Activity  Drug Use No    Additional Social History:                           Allergies:   Allergies  Allergen Reactions  . Oxycodone Shortness Of Breath and Palpitations  . Latex Itching and  Other (See Comments)    "Makes me itch and burn"   Lab Results:  Results for orders placed or performed during the hospital encounter of 11/11/19 (from the past 48 hour(s))  Comprehensive metabolic panel     Status: Abnormal   Collection Time: 11/11/19  8:49 PM  Result Value Ref Range   Sodium 143 135 - 145 mmol/L   Potassium 3.3 (L) 3.5 - 5.1 mmol/L   Chloride 106 98 - 111 mmol/L   CO2 27 22 - 32 mmol/L   Glucose, Bld 113 (H) 70 - 99 mg/dL     Comment: Glucose reference range applies only to samples taken after fasting for at least 8 hours.   BUN 9 6 - 20 mg/dL   Creatinine, Ser 3.82 0.44 - 1.00 mg/dL   Calcium 9.2 8.9 - 50.5 mg/dL   Total Protein 7.2 6.5 - 8.1 g/dL   Albumin 3.8 3.5 - 5.0 g/dL   AST 30 15 - 41 U/L   ALT 23 0 - 44 U/L   Alkaline Phosphatase 79 38 - 126 U/L   Total Bilirubin 0.8 0.3 - 1.2 mg/dL   GFR calc non Af Amer >60 >60 mL/min   GFR calc Af Amer >60 >60 mL/min   Anion gap 10 5 - 15    Comment: Performed at [facility], [address] [place], [place] [address]  Ethanol     Status: None   Collection Time: 11/11/19  8:49 PM  Result Value Ref Range   Alcohol, Ethyl (B) <10 <10 mg/dL    Comment: (NOTE) Lowest detectable limit for serum alcohol is 10 mg/dL. For medical purposes only. Performed at [facility], [address], [place], [place] [address]   Salicylate level     Status: Abnormal   Collection Time: 11/11/19  8:49 PM  Result Value Ref Range   Salicylate Lvl <7.0 (L) 7.0 - 30.0 mg/dL    Comment: Performed at [facility], [address], [place], [place] [address]  Acetaminophen level     Status: Abnormal   Collection Time: 11/11/19  8:49 PM  Result Value Ref Range   Acetaminophen (Tylenol), Serum <10 (L) 10 - 30 ug/mL    Comment: (NOTE) Therapeutic concentrations vary significantly. A range of 10-30 ug/mL  may be an effective concentration for many patients. However, some  are best treated at concentrations outside of this range. Acetaminophen concentrations >150 ug/mL at 4 hours after ingestion  and >50 ug/mL at 12 hours after ingestion are often associated with  toxic reactions. Performed at [facility], [address], [place], [place] [address]   cbc     Status: None   Collection Time: 11/11/19  8:49 PM  Result Value Ref Range   WBC 5.0 4.0 - 10.5 K/uL   RBC 4.30 3.87 - 5.11 MIL/uL   Hemoglobin 12.2 12.0 - 15.0 g/dL   HCT 32.9 92.4 - 26.8 %   MCV  89.8 80.0 - 100.0 fL   MCH 28.4 26.0 - 34.0 pg   MCHC 31.6 30.0 - 36.0 g/dL   RDW 34.1 96.2 - 22.9 %   Platelets 326 150 - 400 K/uL   nRBC 0.0 0.0 - 0.2 %    Comment: Performed at [facility], [address] [place], [place] [address]  I-Stat beta hCG blood, ED     Status: None   Collection Time: 11/11/19  9:22 PM  Result Value Ref Range  I-stat hCG, quantitative <5.0 <5 mIU/mL   Comment 3            Comment:   GEST. AGE      CONC.  (mIU/mL)   <=1 WEEK        5 - 50     2 WEEKS       50 - 500     3 WEEKS       100 - 10,000     4 WEEKS     1,000 - 30,000        FEMALE AND NON-PREGNANT FEMALE:     LESS THAN 5 mIU/mL   Respiratory Panel by RT PCR (Flu A&B, Covid) - Nasopharyngeal Swab     Status: None   Collection Time: 11/11/19 10:34 PM   Specimen: Nasopharyngeal Swab  Result Value Ref Range   SARS Coronavirus 2 by RT PCR NEGATIVE NEGATIVE    Comment: (NOTE) SARS-CoV-2 target nucleic acids are NOT DETECTED. The SARS-CoV-2 RNA is generally detectable in upper respiratoy specimens during the acute phase of infection. The lowest concentration of SARS-CoV-2 viral copies this assay can detect is 131 copies/mL. A negative result does not preclude SARS-Cov-2 infection and should not be used as the sole basis for treatment or other patient management decisions. A negative result may occur with  improper specimen collection/handling, submission of specimen other than nasopharyngeal swab, presence of viral mutation(s) within the areas targeted by this assay, and inadequate number of viral copies (<131 copies/mL). A negative result must be combined with clinical observations, patient history, and epidemiological information. The expected result is Negative. Fact Sheet for Patients:  PinkCheek.be Fact Sheet for Healthcare Providers:  GravelBags.it This test is not yet ap proved or cleared by the [place] FDA and  has  been authorized for detection and/or diagnosis of SARS-CoV-2 by FDA under an Emergency Use Authorization (EUA). This EUA will remain  in effect (meaning this test can be used) for the duration of the COVID-19 declaration under Section 564(b)(1) of the Act, 21 U.S.C. section 360bbb-3(b)(1), unless the authorization is terminated or revoked sooner.    Influenza A by PCR NEGATIVE NEGATIVE   Influenza B by PCR NEGATIVE NEGATIVE    Comment: (NOTE) The Xpert Xpress SARS-CoV-2/FLU/RSV assay is intended as an aid in  the diagnosis of influenza from Nasopharyngeal swab specimens and  should not be used as a sole basis for treatment. Nasal washings and  aspirates are unacceptable for Xpert Xpress SARS-CoV-2/FLU/RSV  testing. Fact Sheet for Patients: PinkCheek.be Fact Sheet for Healthcare Providers: GravelBags.it This test is not yet approved or cleared by the [place] FDA and  has been authorized for detection and/or diagnosis of SARS-CoV-2 by  FDA under an Emergency Use Authorization (EUA). This EUA will remain  in effect (meaning this test can be used) for the duration of the  Covid-19 declaration under Section 564(b)(1) of the Act, 21  U.S.C. section 360bbb-3(b)(1), unless the authorization is  terminated or revoked. Performed at [facility], [place] [address], [place], [place] [address]   Rapid urine drug screen (hospital performed)     Status: None   Collection Time: 11/12/19 10:30 AM  Result Value Ref Range   Opiates NONE DETECTED NONE DETECTED   Cocaine NONE DETECTED NONE DETECTED   Benzodiazepines NONE DETECTED NONE DETECTED   Amphetamines NONE DETECTED NONE DETECTED   Tetrahydrocannabinol NONE DETECTED NONE DETECTED   Barbiturates NONE DETECTED NONE DETECTED    Comment: (NOTE) DRUG  SCREEN FOR MEDICAL PURPOSES ONLY.  IF CONFIRMATION IS NEEDED FOR ANY PURPOSE, NOTIFY LAB WITHIN 5 DAYS. LOWEST DETECTABLE  LIMITS FOR URINE DRUG SCREEN Drug Class                     Cutoff (ng/mL) Amphetamine and metabolites    1000 Barbiturate and metabolites    200 Benzodiazepine                 200 Tricyclics and metabolites     300 Opiates and metabolites        300 Cocaine and metabolites        300 THC                            50 Performed at [facility], [address] [place], [place] [address]     Blood Alcohol level:  Lab Results  Component Value Date   ETH <10 11/11/2019   ETH <10 05/21/2018    Metabolic Disorder Labs:  Lab Results  Component Value Date   HGBA1C 5.5 05/24/2018   MPG 111.15 05/24/2018   No results found for: PROLACTIN No results found for: CHOL, TRIG, HDL, CHOLHDL, VLDL, LDLCALC  Current Medications: No current facility-administered medications for this encounter.   PTA Medications: Medications Prior to Admission  Medication Sig Dispense Refill Last Dose  . cetirizine (ZYRTEC) 10 MG tablet Take 1 tablet (10 mg total) by mouth daily. (Patient not taking: Reported on 11/11/2019) 30 tablet 0   . fluticasone (FLONASE) 50 MCG/ACT nasal spray Place 2 sprays into both nostrils daily. (Patient not taking: Reported on 11/11/2019) 16 g 6   . sertraline (ZOLOFT) 50 MG tablet Take 1 tablet (50 mg total) by mouth daily. (Patient not taking: Reported on 11/11/2019) 30 tablet 0   . topiramate (TOPAMAX) 100 MG tablet Take 1 tablet (100 mg total) by mouth at bedtime. (Patient not taking: Reported on 11/11/2019) 30 tablet 1     Musculoskeletal: Strength & Muscle Tone: within normal limits Gait & Station: normal Patient leans: N/A  Psychiatric Specialty Exam: Physical Exam  Nursing note and vitals reviewed. Constitutional: She appears well-developed and well-nourished.  Cardiovascular: Normal rate and regular rhythm.    Review of Systems  Eyes: Negative.   Respiratory: Negative.   Cardiovascular: Negative.   Gastrointestinal: Negative.   Endocrine: Negative.    Genitourinary: Negative.  Genital sores:    Neurological: Negative.   Hematological: Negative.   Generally invalid though all negative answers, due to patient's psychosis  Blood pressure 136/89, pulse 71, temperature 98.7 F (37.1 C), temperature source Oral, resp. rate 18, height 5\' 1"  (1.549 m), weight 80.3 kg, SpO2 99 %.Body mass index is 33.44 kg/m.  General Appearance: Casual  Eye Contact:  Poor  Speech:  Pressured  Volume:  Normal  Mood:  Hypomanic  Affect:  Labile  Thought Process:  Disorganized and Irrelevant  Orientation:  Other:  Person place situation not date  Thought Content:  Illogical, Delusions and Tangential  Suicidal Thoughts:  No  Homicidal Thoughts:  No  Memory:  Immediate;   Poor Recent;   Poor Remote;   Poor  Judgement:  Impaired  Insight:  Shallow and Believes she is here for medical not psychiatric issues  Psychomotor Activity:  Increased  Concentration:  Concentration: Fair and Attention Span: Fair  Recall:  FiservFair  Fund of Knowledge:  Fair  Language:  Good  Akathisia:  Negative  Handed:  Right  AIMS (if indicated):     Assets:  Communication Skills Physical Health  ADL's:  Intact  Cognition:  WNL  Sleep:       Treatment Plan Summary: Daily contact with patient to assess and evaluate symptoms and progress in treatment and Medication management  Observation Level/Precautions:  15 minute checks  Laboratory:  UDS  Psychotherapy: Reality based med and illness education  Medications: Resume antipsychotic therapy  Consultations:    Discharge Concerns: Longer-term stability long-acting injectable needed  Estimated LOS: 7-10  Other:     Physician Treatment Plan for Primary Diagnosis:   Axis I-schizoaffective disorder bipolar type-acute exacerbation Axis II deferred Axis III no medical issues that are acute at present  Begin antipsychotic therapy reality based therapy  Long Term Goal(s): Improvement in symptoms so as ready for  discharge  Short Term Goals: Ability to identify changes in lifestyle to reduce recurrence of condition will improve, Ability to verbalize feelings will improve and Ability to disclose and discuss suicidal ideas  Physician Treatment Plan for Secondary Diagnosis: Active Problems:   Schizoaffective disorder, bipolar type (HCC)  Long Term Goal(s): Improvement in symptoms so as ready for discharge  Short Term Goals: Ability to identify changes in lifestyle to reduce recurrence of condition will improve, Ability to verbalize feelings will improve, Ability to disclose and discuss suicidal ideas, Ability to demonstrate self-control will improve, Ability to identify and develop effective coping behaviors will improve and Ability to maintain clinical measurements within normal limits will improve  I certify that inpatient services furnished can reasonably be expected to improve the patient's condition.    Malvin Johns, MD 4/2/20213:04 PM

## 2019-11-12 NOTE — Progress Notes (Signed)
Admission Note: Patient is a 49 year old female admitted to the unit for suicidal ideation with a plan to starve herself.  States she has  been unable to refill her prescription due to Covid.  States she is here to get back on her medication for depression.  Patient is alert and oriented x 4.  Presents with a blunted affect and anxious mood.  Admission plan of care reviewed and consent signed.  Skin assessment and personal belongings completed.  Skin is dry and intact.  No contraband found.  Patient oriented to the unit, staff and room.  Routine safety checks initiated. Verbalizes understanding of unit rules and protocols.  Patient is safe on the unit.

## 2019-11-12 NOTE — Progress Notes (Signed)
DID NOT ATTEND     SPIRITUALITY GROUP NOTE  Pt attended spirituality group facilitated by Chaplain Deloras Reichard, MDIv, BCC.  Group Description:  Group focused on topic of hope.  Patients participated in facilitated discussion around topic, connecting with one another around experiences and definitions for hope.  Group members engaged with visual explorer photos, reflecting on what hope looks like for them today.  Group engaged in discussion around how their definitions of hope are present today in hospital.   Modalities: Psycho-social ed, Adlerian, Narrative, MI Patient Progress:   

## 2019-11-12 NOTE — BHH Suicide Risk Assessment (Signed)
[facility] Admission Suicide Risk Assessment   Nursing information obtained from:  Patient Demographic factors:  Low socioeconomic status Current Mental Status:  Self-harm thoughts Loss Factors:  Financial problems / change in socioeconomic status Historical Factors:  NA Risk Reduction Factors:  Positive social support  Total Time spent with patient: 45 minutes Principal Problem: <principal problem not specified> Diagnosis:  Active Problems:   Schizoaffective disorder, bipolar type (HCC)  Subjective Data: see eval  Continued Clinical Symptoms:    The "Alcohol Use Disorders Identification Test", Guidelines for Use in Primary Care, Second Edition.  World Science writer [facility]). Score between 0-7:  no or low risk or alcohol related problems. Score between 8-15:  moderate risk of alcohol related problems. Score between 16-19:  high risk of alcohol related problems. Score 20 or above:  warrants further diagnostic evaluation for alcohol dependence and treatment.   CLINICAL FACTORS:   Previous Psychiatric Diagnoses and Treatments  Musculoskeletal: Strength & Muscle Tone: within normal limits Gait & Station: normal Patient leans: N/A  Psychiatric Specialty Exam: Physical Exam  Nursing note and vitals reviewed. Constitutional: She appears well-developed and well-nourished.  Cardiovascular: Normal rate and regular rhythm.    Review of Systems  Eyes: Negative.   Respiratory: Negative.   Cardiovascular: Negative.   Gastrointestinal: Negative.   Endocrine: Negative.   Genitourinary: Negative.  Genital sores:    Neurological: Negative.   Hematological: Negative.   Generally invalid though all negative answers, due to patient's psychosis  Blood pressure 136/89, pulse 71, temperature 98.7 F (37.1 C), temperature source Oral, resp. rate 18, height 5\' 1"  (1.549 m), weight 80.3 kg, SpO2 99 %.Body mass index is 33.44 kg/m.  General Appearance: Casual  Eye Contact:  Poor  Speech:   Pressured  Volume:  Normal  Mood:  Hypomanic  Affect:  Labile  Thought Process:  Disorganized and Irrelevant  Orientation:  Other:  Person place situation not date  Thought Content:  Illogical, Delusions and Tangential  Suicidal Thoughts:  No  Homicidal Thoughts:  No  Memory:  Immediate;   Poor Recent;   Poor Remote;   Poor  Judgement:  Impaired  Insight:  Shallow and Believes she is here for medical not psychiatric issues  Psychomotor Activity:  Increased  Concentration:  Concentration: Fair and Attention Span: Fair  Recall:  of Knowledge:  Fair  Language:  Good  Akathisia:  Negative  Handed:  Right  AIMS (if indicated):     Assets:  Communication Skills Physical Health  ADL's:  Intact  Cognition:  WNL  Sleep:       COGNITIVE FEATURES THAT CONTRIBUTE TO RISK:  Loss of executive function    SUICIDE RISK:   Minimal: No identifiable suicidal ideation.  Patients presenting with no risk factors but with morbid ruminations; may be classified as minimal risk based on the severity of the depressive symptoms  PLAN OF CARE: admit  I certify that inpatient services furnished can reasonably be expected to improve the patient's condition.   [employer], MD 11/12/2019, 3:13 PM

## 2019-11-12 NOTE — Plan of Care (Signed)
This Clinical research associate spoke with Pt's nurse to check on pt's presenting behaviors and progress. 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 Nursing stated that pt has a steady gait. Nursing stated pt is fully continent and capable of independently completing her ADLs. Pts nursing reported that the pt has no known intellectual disabilities. Pt is accepted to [facility] Room 502-01. Expected admit time 1PM. Nursing explained that pts rapid urine drug screen had not resulted yet.  Rosalita Chessman Bed Placement/Flow Manager [facility] [phone]

## 2019-11-12 NOTE — ED Notes (Signed)
Pt requested to make phone call to sister. Made phone call at desk, no answer. Pt left message.

## 2019-11-12 NOTE — Tx Team (Signed)
Initial Treatment Plan 11/12/2019 2:42 PM Patricia [name] FEX:[id number]    PATIENT STRESSORS: Financial difficulties Health problems Medication change or noncompliance   PATIENT STRENGTHS: Ability for insight Average or above average intelligence Motivation for treatment/growth   PATIENT IDENTIFIED PROBLEMS: "Resume medication"  Suicidal Ideation  Depression                 DISCHARGE CRITERIA:  Ability to meet basic life and health needs Adequate post-discharge living arrangements Motivation to continue treatment in a less acute level of care  PRELIMINARY DISCHARGE PLAN: Attend aftercare/continuing care group Outpatient therapy Return to previous living arrangement  PATIENT/FAMILY INVOLVEMENT: This treatment plan has been presented to and reviewed with the patient, Patricia [name], and/or family member.  The patient and family have been given the opportunity to ask questions and make suggestions.  Clarene Critchley, RN 11/12/2019, 2:42 PM

## 2019-11-13 NOTE — Progress Notes (Signed)
Pt noncompliant with treatment this morning and refuses to take scheduled medications. She reference herself as a Therapist, sports and states "I have pneumonia. I am not taking anything unless it's for pneumonia." Pt hypertensive this am and refused to take prn dose of clonidine when offered. She stated, "I don't have high blood pressure. I have very low blood pressure and you should know that. I told you, I am here because I have pneumonia." Pt noted to be irritable and agitated this morning. She have minimal interaction on the unit and is isolative to her room.   V/s reviewed. Attempted to reassess the pt's b/p. Ativan offered for agitation (pt refused). Verbal support provided. 15 minute checks performed for safety. Marylu Lund, NP., informed about the pt's elevated b/p and that the pt is refusing tx.   Pt resistant to tx. Pt is labile.

## 2019-11-13 NOTE — Progress Notes (Signed)
Pt is hypertensive this am. She refused to take antihypertensive medication when offered. Pt also refused to allow staff to reassess her B/P.

## 2019-11-13 NOTE — Progress Notes (Signed)
Adult Psychoeducational Group Note  Date:  11/13/2019 Time:  12:56 AM  Group Topic/Focus:  Wrap-Up Group:   The focus of this group is to help patients review their daily goal of treatment and discuss progress on daily workbooks.  Participation Level:  Did Not Attend  Participation Quality:  Did Not Attend  Affect:  Did Not Attend  Cognitive:  Did Not Attend  Insight: None  Engagement in Group:  Did Not Attend  Modes of Intervention:  Did Not Attend  Additional Comments:  Pt did not attend evening wrap up group tonight.  Felipa Furnace 11/13/2019, 12:56 AM

## 2019-11-13 NOTE — Progress Notes (Signed)
   11/12/19 2300  COVID-19 Daily Checkoff  Have you had a fever (temp > 37.80C/100F)  in the past 24 hours?  No  If you have had runny nose, nasal congestion, sneezing in the past 24 hours, has it worsened? No  COVID-19 EXPOSURE  Have you traveled outside the state in the past 14 days? No  Have you been in contact with someone with a confirmed diagnosis of COVID-19 or PUI in the past 14 days without wearing appropriate PPE? No  Have you been living in the same home as a person with confirmed diagnosis of COVID-19 or a PUI (household contact)? No  Have you been diagnosed with COVID-19? No

## 2019-11-13 NOTE — BHH Counselor (Signed)
Clinical Social Work Note  CSW unable to do Psychosocial Assessment or discuss discharge planning due to patient's paranoid status.  Attempts will continue tomorrow.  Ambrose Mantle, LCSW 11/13/2019, 4:34 PM

## 2019-11-13 NOTE — Progress Notes (Signed)
   11/13/19 2030  COVID-19 Daily Checkoff  Have you had a fever (temp > 37.80C/100F)  in the past 24 hours?  No  If you have had runny nose, nasal congestion, sneezing in the past 24 hours, has it worsened? No  COVID-19 EXPOSURE  Have you traveled outside the state in the past 14 days? No  Have you been in contact with someone with a confirmed diagnosis of COVID-19 or PUI in the past 14 days without wearing appropriate PPE? No  Have you been living in the same home as a person with confirmed diagnosis of COVID-19 or a PUI (household contact)? No  Have you been diagnosed with COVID-19? No

## 2019-11-13 NOTE — Progress Notes (Signed)
   11/13/19 2030  Psych Admission Type (Psych Patients Only)  Admission Status Voluntary  Psychosocial Assessment  Patient Complaints Suspiciousness  Eye Contact Brief  Facial Expression Animated  Affect Inconsistent with thought content;Preoccupied  Speech Tangential  Interaction Avoidant  Motor Activity Slow  Appearance/Hygiene Disheveled  Behavior Characteristics Unwilling to participate;Guarded  Mood Preoccupied;Suspicious  Thought Process  Coherency Disorganized;Loose associations;Tangential  Content Preoccupation;Paranoia  Delusions Grandeur;Somatic  Perception Derealization  Hallucination None reported or observed  Judgment Poor  Confusion Mild  Danger to Self  Current suicidal ideation? Denies  Danger to Others  Danger to Others None reported or observed

## 2019-11-13 NOTE — Progress Notes (Signed)
[facility]  11/13/2019 9:45 AM Patricia [name]  MRN:  [id number] Subjective:  "I was having relapses of pneumonia and I was suicidal."  Ms. Patricia [name] found in her room. She remains preoccupied with somatic delusions. She states she made suicidal statements on admission because "I was delirious from the pneumonia." She denies SI at this time. She shows no signs of respiratory illness but continues to focus on needing treatment for pneumonia. She is agitated and poorly cooperative on assessment, eventually stopping answering questions and walking away. Nursing staff reports she has been paranoid and easily agitated. 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 She denies AVH. She has been intermittently compliant with medications, stating "I just need medication for my pneumonia."  From admission H&P: This is a repeat admission for Ms. Patricia [name], she is 49 years of age and is known to have a schizoaffective type condition.  She presented 4/1 reporting she had suicidal thoughts, further presentation revealed mania, disjointed statements and denies and variably delusional thoughts.  Principal Problem: <principal problem not specified> Diagnosis: Active Problems:   Schizoaffective disorder, bipolar type (HCC)  Total Time spent with patient: 15 minutes  Past Psychiatric History: See admission H&P  Past Medical History:  Past Medical History:  Diagnosis Date  . Depression    History reviewed. No pertinent surgical history. Family History: History reviewed. No pertinent family history. Family Psychiatric  History: See admission H&P Social History:  Social History   Substance and Sexual Activity  Alcohol Use Yes  . Alcohol/week: 1.0 standard drinks  . Types: 1 Glasses of wine per week   Comment: social      Social History   Substance and Sexual Activity  Drug Use No    Social History   Socioeconomic History  . Marital status: Divorced    Spouse name: Not on file  . Number of  children: Not on file  . Years of education: Not on file  . Highest education level: Not on file  Occupational History  . Not on file  Tobacco Use  . Smoking status: Current Some Day Smoker    Types: Cigarettes, Cigars  . Smokeless tobacco: Never Used  Substance and Sexual Activity  . Alcohol use: Yes    Alcohol/week: 1.0 standard drinks    Types: 1 Glasses of wine per week    Comment: social   . Drug use: No  . Sexual activity: Not on file  Other Topics Concern  . Not on file  Social History Narrative  . Not on file   Social Determinants of Health   Financial Resource Strain:   . Difficulty of Paying Living Expenses:   Food Insecurity:   . Worried About Programme researcher, broadcasting/film/videounning Out of Food in the Last Year:   . Baristaan Out of Food in the Last Year:   Transportation Needs:   . Freight forwarderLack of Transportation (Medical):   Marland Kitchen. Lack of Transportation (Non-Medical):   Physical Activity:   . Days of Exercise per Week:   . Minutes of Exercise per Session:   Stress:   . Feeling of Stress :   Social Connections:   . Frequency of Communication with Friends and Family:   . Frequency of Social Gatherings with Friends and Family:   . Attends Religious Services:   . Active Member of Clubs or Organizations:   . Attends BankerClub or Organization Meetings:   Marland Kitchen. Marital Status:    Additional Social History:  Sleep: Fair  Appetite:  Fair  Current Medications: Current Facility-Administered Medications  Medication Dose Route Frequency Provider Last Rate Last Admin  . acetaminophen (TYLENOL) tablet 650 mg  650 mg Oral Q6H PRN Johnn Hai, MD      . alum & mag hydroxide-simeth (MAALOX/MYLANTA) 200-200-20 MG/5ML suspension 30 mL  30 mL Oral Q4H PRN Johnn Hai, MD      . cloNIDine (CATAPRES) tablet 0.2 mg  0.2 mg Oral Q6H PRN Johnn Hai, MD      . LORazepam (ATIVAN) tablet 2 mg  2 mg Oral Q4H PRN Johnn Hai, MD       Or  . LORazepam (ATIVAN) injection 2 mg  2 mg Intramuscular Q4H  PRN Johnn Hai, MD      . magnesium hydroxide (MILK OF MAGNESIA) suspension 30 mL  30 mL Oral Daily PRN Johnn Hai, MD      . OLANZapine zydis (ZYPREXA) disintegrating tablet 10 mg  10 mg Oral BID Johnn Hai, MD   10 mg at 11/12/19 1734  . temazepam (RESTORIL) capsule 30 mg  30 mg Oral QHS Johnn Hai, MD        Lab Results:  Results for orders placed or performed during the hospital encounter of 11/11/19 (from the past 48 hour(s))  Comprehensive metabolic panel     Status: Abnormal   Collection Time: 11/11/19  8:49 PM  Result Value Ref Range   Sodium 143 135 - 145 mmol/L   Potassium 3.3 (L) 3.5 - 5.1 mmol/L   Chloride 106 98 - 111 mmol/L   CO2 27 22 - 32 mmol/L   Glucose, Bld 113 (H) 70 - 99 mg/dL    Comment: Glucose reference range applies only to samples taken after fasting for at least 8 hours.   BUN 9 6 - 20 mg/dL   Creatinine, Ser 0.67 0.44 - 1.00 mg/dL   Calcium 9.2 8.9 - 10.3 mg/dL   Total Protein 7.2 6.5 - 8.1 g/dL   Albumin 3.8 3.5 - 5.0 g/dL   AST 30 15 - 41 U/L   ALT 23 0 - 44 U/L   Alkaline Phosphatase 79 38 - 126 U/L   Total Bilirubin 0.8 0.3 - 1.2 mg/dL   GFR calc non Af Amer >60 >60 mL/min   GFR calc Af Amer >60 >60 mL/min   Anion gap 10 5 - 15    Comment: Performed at [facility], [place] [address], [place], [place] [address]  Ethanol     Status: None   Collection Time: 11/11/19  8:49 PM  Result Value Ref Range   Alcohol, Ethyl (B) <10 <10 mg/dL    Comment: (NOTE) Lowest detectable limit for serum alcohol is 10 mg/dL. For medical purposes only. Performed at [facility], [place] [address] [place], [place] [address]   Salicylate level     Status: Abnormal   Collection Time: 11/11/19  8:49 PM  Result Value Ref Range   Salicylate Lvl <3.7 (L) 7.0 - 30.0 mg/dL    Comment: Performed at [place] [address] [place],  [address]  Acetaminophen level     Status: Abnormal   Collection Time: 11/11/19  8:49 PM  Result Value Ref  Range   Acetaminophen (Tylenol), Serum <10 (L) 10 - 30 ug/mL    Comment: (NOTE) Therapeutic concentrations vary significantly. A range of 10-30 ug/mL  may be an effective concentration for many patients. However, some  are best treated at concentrations  outside of this range. Acetaminophen concentrations >150 ug/mL at 4 hours after ingestion  and >50 ug/mL at 12 hours after ingestion are often associated with  toxic reactions. Performed at [facility], [address] [place], [place] [address]   cbc     Status: None   Collection Time: 11/11/19  8:49 PM  Result Value Ref Range   WBC 5.0 4.0 - 10.5 K/uL   RBC 4.30 3.87 - 5.11 MIL/uL   Hemoglobin 12.2 12.0 - 15.0 g/dL   HCT 52.7 78.2 - 42.3 %   MCV 89.8 80.0 - 100.0 fL   MCH 28.4 26.0 - 34.0 pg   MCHC 31.6 30.0 - 36.0 g/dL   RDW 53.6 14.4 - 31.5 %   Platelets 326 150 - 400 K/uL   nRBC 0.0 0.0 - 0.2 %    Comment: Performed at [facility], [address] [place], [place] [address]  I-Stat beta hCG blood, ED     Status: None   Collection Time: 11/11/19  9:22 PM  Result Value Ref Range   I-stat hCG, quantitative <5.0 <5 mIU/mL   Comment 3            Comment:   GEST. AGE      CONC.  (mIU/mL)   <=1 WEEK        5 - 50     2 WEEKS       50 - 500     3 WEEKS       100 - 10,000     4 WEEKS     1,000 - 30,000        FEMALE AND NON-PREGNANT FEMALE:     LESS THAN 5 mIU/mL   Respiratory Panel by RT PCR (Flu A&B, Covid) - Nasopharyngeal Swab     Status: None   Collection Time: 11/11/19 10:34 PM   Specimen: Nasopharyngeal Swab  Result Value Ref Range   SARS Coronavirus 2 by RT PCR NEGATIVE NEGATIVE    Comment: (NOTE) SARS-CoV-2 target nucleic acids are NOT DETECTED. The SARS-CoV-2 RNA is generally detectable in upper respiratoy specimens during the acute phase of infection. The lowest concentration of SARS-CoV-2 viral copies this assay can detect is 131 copies/mL. A negative result does not preclude SARS-Cov-2 infection  and should not be used as the sole basis for treatment or other patient management decisions. A negative result may occur with  improper specimen collection/handling, submission of specimen other than nasopharyngeal swab, presence of viral mutation(s) within the areas targeted by this assay, and inadequate number of viral copies (<131 copies/mL). A negative result must be combined with clinical observations, patient history, and epidemiological information. The expected result is Negative. Fact Sheet for Patients:  https://www.moore.com/ Fact Sheet for Healthcare Providers:  https://www.young.biz/ This test is not yet ap proved or cleared by the [place] FDA and  has been authorized for detection and/or diagnosis of SARS-CoV-2 by FDA under an Emergency Use Authorization (EUA). This EUA will remain  in effect (meaning this test can be used) for the duration of the COVID-19 declaration under Section 564(b)(1) of the Act, 21 U.S.C. section 360bbb-3(b)(1), unless the authorization is terminated or revoked sooner.    Influenza A by PCR NEGATIVE NEGATIVE   Influenza B by PCR NEGATIVE NEGATIVE    Comment: (NOTE) The Xpert Xpress SARS-CoV-2/FLU/RSV assay is intended as an aid in  the diagnosis of influenza from Nasopharyngeal swab specimens and  should not be used as a  sole basis for treatment. Nasal washings and  aspirates are unacceptable for Xpert Xpress SARS-CoV-2/FLU/RSV  testing. Fact Sheet for Patients: https://www.moore.com/ Fact Sheet for Healthcare Providers: https://www.young.biz/ This test is not yet approved or cleared by the [place] FDA and  has been authorized for detection and/or diagnosis of SARS-CoV-2 by  FDA under an Emergency Use Authorization (EUA). This EUA will remain  in effect (meaning this test can be used) for the duration of the  Covid-19 declaration under Section 564(b)(1)  of the Act, 21  U.S.C. section 360bbb-3(b)(1), unless the authorization is  terminated or revoked. Performed at [facility], [address] [place], [place] [address]   Rapid urine drug screen (hospital performed)     Status: None   Collection Time: 11/12/19 10:30 AM  Result Value Ref Range   Opiates NONE DETECTED NONE DETECTED   Cocaine NONE DETECTED NONE DETECTED   Benzodiazepines NONE DETECTED NONE DETECTED   Amphetamines NONE DETECTED NONE DETECTED   Tetrahydrocannabinol NONE DETECTED NONE DETECTED   Barbiturates NONE DETECTED NONE DETECTED    Comment: (NOTE) DRUG SCREEN FOR MEDICAL PURPOSES ONLY.  IF CONFIRMATION IS NEEDED FOR ANY PURPOSE, NOTIFY LAB WITHIN 5 DAYS. LOWEST DETECTABLE LIMITS FOR URINE DRUG SCREEN Drug Class                     Cutoff (ng/mL) Amphetamine and metabolites    1000 Barbiturate and metabolites    200 Benzodiazepine                 200 Tricyclics and metabolites     300 Opiates and metabolites        300 Cocaine and metabolites        300 THC                            50 Performed at [facility], [address], [place], [place] [address]     Blood Alcohol level:  Lab Results  Component Value Date   ETH <10 11/11/2019   ETH <10 05/21/2018    Metabolic Disorder Labs: Lab Results  Component Value Date   HGBA1C 5.5 05/24/2018   MPG 111.15 05/24/2018   No results found for: PROLACTIN No results found for: CHOL, TRIG, HDL, CHOLHDL, VLDL, LDLCALC  Physical Findings: AIMS:  , ,  ,  ,    CIWA:    COWS:     Musculoskeletal: Strength & Muscle Tone: within normal limits Gait & Station: normal Patient leans: N/A  Psychiatric Specialty Exam: Physical Exam  Nursing note and vitals reviewed. Constitutional: She is oriented to person, place, and time. She appears well-developed and well-nourished.  Respiratory: Effort normal.  Neurological: She is alert and oriented to person, place, and time.    Review of Systems   Constitutional: Negative.   Respiratory: Negative for cough and shortness of breath.   Psychiatric/Behavioral: Positive for agitation and dysphoric mood. Negative for behavioral problems, hallucinations, self-injury, sleep disturbance and suicidal ideas. The patient is not nervous/anxious and is not hyperactive.     Blood pressure (!) 157/141, pulse (!) 128, temperature 98.7 F (37.1 C), temperature source Oral, resp. rate 18, height 5\' 1"  (1.549 m), weight 80.3 kg, SpO2 99 %.Body mass index is 33.44 kg/m.  General Appearance: Disheveled  Eye Contact:  Fair  Speech:  Pressured  Volume:  Increased  Mood:  Irritable  Affect:  Congruent  Thought Process:  Descriptions of Associations: Tangential  Orientation:  Full (Time, Place, and Person)  Thought Content:  Delusions  Suicidal Thoughts:  No  Homicidal Thoughts:  No  Memory:  Immediate;   Fair Recent;   Fair  Judgement:  Impaired  Insight:  Lacking  Psychomotor Activity:  Increased  Concentration:  Concentration: Poor and Attention Span: Poor  Recall:  Poor  Fund of Knowledge:  Fair  Language:  Fair  Akathisia:  No  Handed:  Right  AIMS (if indicated):     Assets:  Communication Skills Resilience  ADL's:  Intact  Cognition:  WNL  Sleep:  Number of Hours: 5.25     Treatment Plan Summary: Daily contact with patient to assess and evaluate symptoms and progress in treatment and Medication management   Continue inpatient hospitalization. Continue to encourage medication compliance.  Continue Zyprexa 10 mg PO BID for psychosis Continue clonidine 0.2 mg PO Q6HR PRN DBP>95 Continue Ativan 2 mg PO/IM Q4HR PRN agitation Continue Restoril 30 mg PO QHS for insomnia  Patient will participate in the therapeutic group milieu.  Discharge disposition in progress.   Aldean Baker, NP 11/13/2019, 9:45 AM

## 2019-11-13 NOTE — Progress Notes (Signed)
Pt refused to take scheduled morning medication.

## 2019-11-13 NOTE — Progress Notes (Signed)
Patient remains delusional and preoccupied with her thoughts. She has been up in the dayroom briefly tonight watching tv. Writer attempted to speak with her but she just smiled and went back to watching tv. Safety maintained on unit with 15 min checks.

## 2019-11-14 NOTE — [facility] Suicide Risk Assessment (Signed)
[facility] INPATIENT:  Family/Significant Other Suicide Prevention Education  Suicide Prevention Education:  Patient Refusal for Family/Significant Other Suicide Prevention Education: The patient Patricia [name] has refused to provide written consent for family/significant other to be provided Family/Significant Other Suicide Prevention Education during admission and/or prior to discharge.  Physician notified.  Carloyn Jaeger Grossman-Orr 11/14/2019, 10:48 AM

## 2019-11-14 NOTE — Progress Notes (Addendum)
Pt continues to refuse medications and stated she will only take medications for pneumonia. During a routine safety check the pt was observed talking to herself and laughing. When asked if she was talking to someone, she stated "I think out loud."          11/14/19 0900  Psych Admission Type (Psych Patients Only)  Admission Status Voluntary  Psychosocial Assessment  Patient Complaints Suspiciousness  Eye Contact Brief  Facial Expression Animated  Affect Inconsistent with thought content;Preoccupied  Speech Tangential  Interaction Avoidant  Motor Activity Slow  Appearance/Hygiene Disheveled  Behavior Characteristics Irritable;Resistant to care  Mood Preoccupied;Suspicious;Irritable  Thought Process  Coherency Disorganized;Loose associations;Tangential  Content Preoccupation;Paranoia  Delusions Grandeur;Somatic  Perception Derealization  Hallucination None reported or observed  Judgment Poor  Confusion Mild  Danger to Self  Current suicidal ideation? Denies  Danger to Others  Danger to Others None reported or observed

## 2019-11-14 NOTE — Progress Notes (Signed)
[facility] MD Progress Note  11/14/2019 9:48 AM Patricia [name]  MRN:  [id number] Subjective:  "I'm doing great."  Patricia [name] found sitting in the dayroom. She remains delusional, tangential, and somatically preoccupied. She continues to state she is having "relapses of pneumonia." Her mood is significantly improved with no agitation at this time. She is more cooperative on assessment. She has been refusing scheduled medications and continues to state she will only take medications for pneumonia. She denies cough, CP, SOB, states "The pneumonia is getting better." She denies SI/HI/AVH. She shows no signs of responding to internal stimuli. She has been socializing in the dayroom. No agitated or disruptive behaviors on the unit. Blood pressure improved to 131/90, heart rate 81 today.  From admission H&P: This is a repeat admission for Patricia [name], she is 49 years of age and is known to have a schizoaffective type condition. She presented 4/1 reporting she had suicidal thoughts,further presentation revealed mania, disjointed statements and denies and variably delusional thoughts.  Principal Problem: <principal problem not specified> Diagnosis: Active Problems:   Schizoaffective disorder, bipolar type ([place])  Total Time spent with patient: 15 minutes  Past Psychiatric History: See admission H&P  Past Medical History:  Past Medical History:  Diagnosis Date  . Depression    History reviewed. No pertinent surgical history. Family History: History reviewed. No pertinent family history. Family Psychiatric  History: See admission H&P Social History:  Social History   Substance and Sexual Activity  Alcohol Use Yes  . Alcohol/week: 1.0 standard drinks  . Types: 1 Glasses of wine per week   Comment: social      Social History   Substance and Sexual Activity  Drug Use No    Social History   Socioeconomic History  . Marital status: Divorced    Spouse name: Not on file  . Number of  children: Not on file  . Years of education: Not on file  . Highest education level: Not on file  Occupational History  . Not on file  Tobacco Use  . Smoking status: Current Some Day Smoker    Types: Cigarettes, Cigars  . Smokeless tobacco: Never Used  Substance and Sexual Activity  . Alcohol use: Yes    Alcohol/week: 1.0 standard drinks    Types: 1 Glasses of wine per week    Comment: social   . Drug use: No  . Sexual activity: Not on file  Other Topics Concern  . Not on file  Social History Narrative  . Not on file   Social Determinants of Health   Financial Resource Strain:   . Difficulty of Paying Living Expenses:   Food Insecurity:   . Worried About Charity fundraiser in the Last Year:   . Arboriculturist in the Last Year:   Transportation Needs:   . Film/video editor (Medical):   Marland Kitchen Lack of Transportation (Non-Medical):   Physical Activity:   . Days of Exercise per Week:   . Minutes of Exercise per Session:   Stress:   . Feeling of Stress :   Social Connections:   . Frequency of Communication with Friends and Family:   . Frequency of Social Gatherings with Friends and Family:   . Attends Religious Services:   . Active Member of Clubs or Organizations:   . Attends Archivist Meetings:   Marland Kitchen Marital Status:    Additional Social History:  Sleep: Fair  Appetite:  Fair  Current Medications: Current Facility-Administered Medications  Medication Dose Route Frequency Provider Last Rate Last Admin  . acetaminophen (TYLENOL) tablet 650 mg  650 mg Oral Q6H PRN Malvin Johns, MD      . alum & mag hydroxide-simeth (MAALOX/MYLANTA) 200-200-20 MG/5ML suspension 30 mL  30 mL Oral Q4H PRN Malvin Johns, MD      . cloNIDine (CATAPRES) tablet 0.2 mg  0.2 mg Oral Q6H PRN Malvin Johns, MD      . LORazepam (ATIVAN) tablet 2 mg  2 mg Oral Q4H PRN Malvin Johns, MD       Or  . LORazepam (ATIVAN) injection 2 mg  2 mg Intramuscular Q4H  PRN Malvin Johns, MD      . magnesium hydroxide (MILK OF MAGNESIA) suspension 30 mL  30 mL Oral Daily PRN Malvin Johns, MD      . OLANZapine zydis (ZYPREXA) disintegrating tablet 10 mg  10 mg Oral BID Malvin Johns, MD   10 mg at 11/12/19 1734  . temazepam (RESTORIL) capsule 30 mg  30 mg Oral QHS Malvin Johns, MD        Lab Results:  Results for orders placed or performed during the hospital encounter of 11/11/19 (from the past 48 hour(s))  Rapid urine drug screen (hospital performed)     Status: None   Collection Time: 11/12/19 10:30 AM  Result Value Ref Range   Opiates NONE DETECTED NONE DETECTED   Cocaine NONE DETECTED NONE DETECTED   Benzodiazepines NONE DETECTED NONE DETECTED   Amphetamines NONE DETECTED NONE DETECTED   Tetrahydrocannabinol NONE DETECTED NONE DETECTED   Barbiturates NONE DETECTED NONE DETECTED    Comment: (NOTE) DRUG SCREEN FOR MEDICAL PURPOSES ONLY.  IF CONFIRMATION IS NEEDED FOR ANY PURPOSE, NOTIFY LAB WITHIN 5 DAYS. LOWEST DETECTABLE LIMITS FOR URINE DRUG SCREEN Drug Class                     Cutoff (ng/mL) Amphetamine and metabolites    1000 Barbiturate and metabolites    200 Benzodiazepine                 200 Tricyclics and metabolites     300 Opiates and metabolites        300 Cocaine and metabolites        300 THC                            50 Performed at [facility], [address], [place], [place] [address]     Blood Alcohol level:  Lab Results  Component Value Date   ETH <10 11/11/2019   ETH <10 05/21/2018    Metabolic Disorder Labs: Lab Results  Component Value Date   HGBA1C 5.5 05/24/2018   MPG 111.15 05/24/2018   No results found for: PROLACTIN No results found for: CHOL, TRIG, HDL, CHOLHDL, VLDL, LDLCALC  Physical Findings: AIMS:  , ,  ,  ,    CIWA:    COWS:     Musculoskeletal: Strength & Muscle Tone: within normal limits Gait & Station: normal Patient leans: N/A  Psychiatric Specialty Exam: Physical Exam   Nursing note and vitals reviewed. Constitutional: She is oriented to person, place, and time. She appears well-developed and well-nourished.  Cardiovascular: Normal rate.  Respiratory: Effort normal.  Neurological: She is alert and oriented to person, place, and time.    Review of Systems  Constitutional: Negative.   Respiratory: Negative for cough and shortness of breath.   Cardiovascular: Negative for chest pain.  Psychiatric/Behavioral: Positive for sleep disturbance. Negative for agitation, behavioral problems, dysphoric mood, hallucinations, self-injury and suicidal ideas. The patient is hyperactive. The patient is not nervous/anxious.     Blood pressure 131/90, pulse 81, temperature 98.7 F (37.1 C), temperature source Oral, resp. rate 18, height 5\' 1"  (1.549 m), weight 80.3 kg, SpO2 99 %.Body mass index is 33.44 kg/m.  General Appearance: Fairly Groomed  Eye Contact:  Good  Speech:  Normal Rate  Volume:  Normal  Mood:  Euthymic  Affect:  Congruent  Thought Process:  Coherent  Orientation:  Full (Time, Place, and Person)  Thought Content:  Delusions  Suicidal Thoughts:  No  Homicidal Thoughts:  No  Memory:  Immediate;   Fair Recent;   Fair  Judgement:  Poor  Insight:  Lacking  Psychomotor Activity:  Normal  Concentration:  Concentration: Fair and Attention Span: Fair  Recall:  of Knowledge:  Fair  Language:  Good  Akathisia:  No  Handed:  Right  AIMS (if indicated):     Assets:  Communication Skills Desire for Improvement Leisure Time Resilience  ADL's:  Intact  Cognition:  WNL  Sleep:  Number of Hours: 3.75     Treatment Plan Summary: Daily contact with patient to assess and evaluate symptoms and progress in treatment and Medication management   Continue inpatient hospitalization. Continue to encourage medication compliance.  Continue Zyprexa 10 mg PO BID for psychosis Continue clonidine 0.2 mg PO Q6HR PRN DBP>95 Continue Ativan 2 mg PO/IM  Q4HR PRN agitation Continue Restoril 30 mg PO QHS for insomnia  Patient will participate in the therapeutic group milieu.  Discharge disposition in progress.   [employer], NP 11/14/2019, 9:48 AM
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
## 2019-11-14 NOTE — Progress Notes (Signed)
Patient has been isolative to her room tonight. Writer spoke with her 1:1 and she reports her day as being good, denies pain, si/hi but is responding to internal stimuli. She has been heard talking to herself outside her door. She does not want to take her hs medicine. Safety maintained with 15 min checks.

## 2019-11-15 MED ORDER — HALOPERIDOL LACTATE 5 MG/ML IJ SOLN
10.0000 mg | Freq: Once | INTRAMUSCULAR | Status: AC
  Administered 2019-11-15: 10 mg via INTRAMUSCULAR
  Filled 2019-11-15 (×2): qty 2

## 2019-11-15 MED ORDER — DIPHENHYDRAMINE HCL 50 MG/ML IJ SOLN
50.0000 mg | Freq: Once | INTRAMUSCULAR | Status: AC
  Administered 2019-11-15: 50 mg via INTRAMUSCULAR
  Filled 2019-11-15 (×2): qty 1

## 2019-11-15 NOTE — Tx Team (Signed)
Interdisciplinary Treatment and Diagnostic Plan Update  11/15/2019 Time of Session: 9:00am Patricia [name] MRN: [id number]  Principal Diagnosis: <principal problem not specified>  Secondary Diagnoses: Active Problems:   Schizoaffective disorder, bipolar type ([place])   Current Medications:  Current Facility-Administered Medications  Medication Dose Route Frequency Provider Last Rate Last Admin  . acetaminophen (TYLENOL) tablet 650 mg  650 mg Oral Q6H PRN Johnn Hai, MD      . alum & mag hydroxide-simeth (MAALOX/MYLANTA) 200-200-20 MG/5ML suspension 30 mL  30 mL Oral Q4H PRN Johnn Hai, MD      . cloNIDine (CATAPRES) tablet 0.2 mg  0.2 mg Oral Q6H PRN Johnn Hai, MD      . LORazepam (ATIVAN) tablet 2 mg  2 mg Oral Q4H PRN Johnn Hai, MD       Or  . LORazepam (ATIVAN) injection 2 mg  2 mg Intramuscular Q4H PRN Johnn Hai, MD      . magnesium hydroxide (MILK OF MAGNESIA) suspension 30 mL  30 mL Oral Daily PRN Johnn Hai, MD      . OLANZapine zydis (ZYPREXA) disintegrating tablet 10 mg  10 mg Oral BID Johnn Hai, MD   10 mg at 11/12/19 1734  . temazepam (RESTORIL) capsule 30 mg  30 mg Oral QHS Johnn Hai, MD       PTA Medications: Medications Prior to Admission  Medication Sig Dispense Refill Last Dose  . cetirizine (ZYRTEC) 10 MG tablet Take 1 tablet (10 mg total) by mouth daily. (Patient not taking: Reported on 11/11/2019) 30 tablet 0   . fluticasone (FLONASE) 50 MCG/ACT nasal spray Place 2 sprays into both nostrils daily. (Patient not taking: Reported on 11/11/2019) 16 g 6   . sertraline (ZOLOFT) 50 MG tablet Take 1 tablet (50 mg total) by mouth daily. (Patient not taking: Reported on 11/11/2019) 30 tablet 0   . topiramate (TOPAMAX) 100 MG tablet Take 1 tablet (100 mg total) by mouth at bedtime. (Patient not taking: Reported on 11/11/2019) 30 tablet 1     Patient Stressors: Financial difficulties Health problems Medication change or noncompliance  Patient Strengths:  Ability for insight Average or above average intelligence Motivation for treatment/growth  Treatment Modalities: Medication Management, Group therapy, Case management,  1 to 1 session with clinician, Psychoeducation, Recreational therapy.   Physician Treatment Plan for Primary Diagnosis: <principal problem not specified> Long Term Goal(s): Improvement in symptoms so as ready for discharge Improvement in symptoms so as ready for discharge   Short Term Goals: Ability to identify changes in lifestyle to reduce recurrence of condition will improve Ability to verbalize feelings will improve Ability to disclose and discuss suicidal ideas Ability to identify changes in lifestyle to reduce recurrence of condition will improve Ability to verbalize feelings will improve Ability to disclose and discuss suicidal ideas Ability to demonstrate self-control will improve Ability to identify and develop effective coping behaviors will improve Ability to maintain clinical measurements within normal limits will improve  Medication Management: Evaluate patient's response, side effects, and tolerance of medication regimen.  Therapeutic Interventions: 1 to 1 sessions, Unit Group sessions and Medication administration.  Evaluation of Outcomes: Not Met  Physician Treatment Plan for Secondary Diagnosis: Active Problems:   Schizoaffective disorder, bipolar type ([place])  Long Term Goal(s): Improvement in symptoms so as ready for discharge Improvement in symptoms so as ready for discharge   Short Term Goals: Ability to identify changes in lifestyle to reduce recurrence of condition will improve Ability to verbalize feelings will improve  Ability to disclose and discuss suicidal ideas Ability to identify changes in lifestyle to reduce recurrence of condition will improve Ability to verbalize feelings will improve Ability to disclose and discuss suicidal ideas Ability to demonstrate self-control will  improve Ability to identify and develop effective coping behaviors will improve Ability to maintain clinical measurements within normal limits will improve     Medication Management: Evaluate patient's response, side effects, and tolerance of medication regimen.  Therapeutic Interventions: 1 to 1 sessions, Unit Group sessions and Medication administration.  Evaluation of Outcomes: Not Met   RN Treatment Plan for Primary Diagnosis: <principal problem not specified> Long Term Goal(s): Knowledge of disease and therapeutic regimen to maintain health will improve  Short Term Goals: Ability to verbalize feelings will improve, Ability to identify and develop effective coping behaviors will improve and Compliance with prescribed medications will improve  Medication Management: RN will administer medications as ordered by provider, will assess and evaluate patient's response and provide education to patient for prescribed medication. RN will report any adverse and/or side effects to prescribing provider.  Therapeutic Interventions: 1 on 1 counseling sessions, Psychoeducation, Medication administration, Evaluate responses to treatment, Monitor vital signs and CBGs as ordered, Perform/monitor CIWA, COWS, AIMS and Fall Risk screenings as ordered, Perform wound care treatments as ordered.  Evaluation of Outcomes: Not Met   LCSW Treatment Plan for Primary Diagnosis: <principal problem not specified> Long Term Goal(s): Safe transition to appropriate next level of care at discharge, Engage patient in therapeutic group addressing interpersonal concerns.  Short Term Goals: Engage patient in aftercare planning with referrals and resources, Identify triggers associated with mental health/substance abuse issues and Increase skills for wellness and recovery  Therapeutic Interventions: Assess for all discharge needs, 1 to 1 time with Social worker, Explore available resources and support systems, Assess for  adequacy in community support network, Educate family and significant other(s) on suicide prevention, Complete Psychosocial Assessment, Interpersonal group therapy.  Evaluation of Outcomes: Not Met  Progress in Treatment: Attending groups: Yes. Participating in groups: Yes. Taking medication as prescribed: No. Refusing meds. Toleration medication: Yes. Family/Significant other contact made: No, will contact:  SPE reviewed with patient. Patient declined consents. Patient understands diagnosis: No. Discussing patient identified problems/goals with staff: Yes. Medical problems stabilized or resolved: No. Denies suicidal/homicidal ideation: Yes. Issues/concerns per patient self-inventory: Yes.  New problem(s) identified: Yes, Describe:  housing instability.  New Short Term/Long Term Goal(s): medication management for mood stabilization; elimination of SI thoughts; development of comprehensive mental wellness/sobriety plan.  Patient Goals:    Discharge Plan or Barriers: Patient reports she does not intend to report, is agreeable to follow up with [place].  Reason for Continuation of Hospitalization: Anxiety Delusions  Depression Hallucinations Medication stabilization  Estimated Length of Stay: 3-5 days   Attendees: Patient:  11/15/2019 9:54 AM  Physician: Dr.Farah  11/15/2019 9:54 AM  Nursing:  11/15/2019 9:54 AM  RN Care Manager: 11/15/2019 9:54 AM  Social Worker: Stephanie Acre, [place]  11/15/2019 9:54 AM  Recreational Therapist:  11/15/2019 9:54 AM  Other:  11/15/2019 9:54 AM  Other:  11/15/2019 9:54 AM  Other: 11/15/2019 9:54 AM    Scribe for Treatment Team: Joellen [place], [place] 11/15/2019 9:54 AM

## 2019-11-15 NOTE — Progress Notes (Signed)
   11/14/19 2100  COVID-19 Daily Checkoff  Have you had a fever (temp > 37.80C/100F)  in the past 24 hours?  No  If you have had runny nose, nasal congestion, sneezing in the past 24 hours, has it worsened? No  COVID-19 EXPOSURE  Have you traveled outside the state in the past 14 days? No  Have you been in contact with someone with a confirmed diagnosis of COVID-19 or PUI in the past 14 days without wearing appropriate PPE? No  Have you been living in the same home as a person with confirmed diagnosis of COVID-19 or a PUI (household contact)? No  Have you been diagnosed with COVID-19? No   

## 2019-11-15 NOTE — Progress Notes (Signed)
Recreation Therapy Notes  Date: 4.5.21 Time: 1000 Location: 500 Hall Dayroom  Group Topic: Coping Skills  Goal Area(s) Addresses:  Patient will identify positive coping strategies. Patient will identify benefit of using coping strategies post d/c.  Behavioral Response: Engaged  Intervention: Worksheet  Activity: Healthy vs. Unhealthy Coping Strategies.  Patients were to identify a current problem they are currently dealing with.  Patients also had to identify unhealthy coping strategies and consequences of these coping strategies.  Lastly, patients identified healthy coping strategies, expected outcomes and barriers to using these coping strategies.  Education: Pharmacologist, Building control surveyor.   Education Outcome: Acknowledges understanding/In group clarification offered/Needs additional education.   Clinical Observations/Feedback: Pt current problem was reaching friends.  Pt identified unhealthy coping strategies as fussing about it and being depressed.  Consequences of these are conflict with mate and deterred from it.  Pt identified healthy coping strategies as pray, meditate and keeping in contact.  Expected outcomes are positive change, calm down and making contact with them.  Barrier identified was access to phone.    Caroll Rancher, LRT/CTRS    Caroll Rancher A 11/15/2019 12:05 PM

## 2019-11-15 NOTE — Progress Notes (Signed)
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, went to [facility], [place], [employer]..." And rambles on about other disjointed things that are clearly delusional but not organized  Denies hallucinations denies the need for medications, was generally contained and staying in her room but then burst into the day room and was screaming at other patients who did not react approximately 11:40 AM Principal Problem: Schizoaffective disorder/untreated Diagnosis: Active Problems:   Schizoaffective disorder, bipolar type ([place])  Total Time spent with patient: 15 minutes  Past Psychiatric History: see eval  Past Medical History:  Past Medical History:  Diagnosis Date  . Depression    History reviewed. No pertinent surgical history. Family History: History reviewed. No pertinent family history. Family Psychiatric  History: see evalk Social History:  Social History   Substance and Sexual Activity  Alcohol Use Yes  . Alcohol/week: 1.0 standard drinks  . Types: 1 Glasses of wine per week   Comment: social      Social History   Substance and Sexual Activity  Drug Use No    Social History   Socioeconomic History  . Marital status: Divorced    Spouse name: Not on file  . Number of children: Not on file  . Years of education: Not on file  . Highest education level: Not on file  Occupational History  . Not on file  Tobacco Use  . Smoking status: Current Some Day Smoker    Types: Cigarettes, Cigars  . Smokeless tobacco: Never Used  Substance and Sexual Activity  . Alcohol use: Yes    Alcohol/week: 1.0 standard drinks    Types: 1 Glasses of wine per week    Comment: social   . Drug use: No  . Sexual activity: Not on file  Other Topics Concern  . Not on file  Social History  Narrative  . Not on file   Social Determinants of Health   Financial Resource Strain:   . Difficulty of Paying Living Expenses:   Food Insecurity:   . Worried About Charity fundraiser in the Last Year:   . Arboriculturist in the Last Year:   Transportation Needs:   . Film/video editor (Medical):   Marland Kitchen Lack of Transportation (Non-Medical):   Physical Activity:   . Days of Exercise per Week:   . Minutes of Exercise per Session:   Stress:   . Feeling of Stress :   Social Connections:   . Frequency of Communication with Friends and Family:   . Frequency of Social Gatherings with Friends and Family:   . Attends Religious Services:   . Active Member of Clubs or Organizations:   . Attends Archivist Meetings:   Marland Kitchen Marital Status:    Additional Social History:                         Sleep: Fair  Appetite:  Fair  Current Medications: Current Facility-Administered Medications  Medication Dose Route Frequency Provider Last Rate Last Admin  . acetaminophen (TYLENOL) tablet 650 mg  650 mg Oral Q6H PRN Johnn Hai, MD      . alum & mag hydroxide-simeth (MAALOX/MYLANTA) 200-200-20 MG/5ML suspension 30 mL  30 mL Oral Q4H PRN Johnn Hai, MD      . cloNIDine (  CATAPRES) tablet 0.2 mg  0.2 mg Oral Q6H PRN Malvin Johns, MD      . diphenhydrAMINE (BENADRYL) injection 50 mg  50 mg Intramuscular Once Malvin Johns, MD      . haloperidol lactate (HALDOL) injection 10 mg  10 mg Intramuscular Once Malvin Johns, MD      . LORazepam (ATIVAN) tablet 2 mg  2 mg Oral Q4H PRN Malvin Johns, MD       Or  . LORazepam (ATIVAN) injection 2 mg  2 mg Intramuscular Q4H PRN Malvin Johns, MD      . magnesium hydroxide (MILK OF MAGNESIA) suspension 30 mL  30 mL Oral Daily PRN Malvin Johns, MD      . OLANZapine zydis (ZYPREXA) disintegrating tablet 10 mg  10 mg Oral BID Malvin Johns, MD   10 mg at 11/12/19 1734  . temazepam (RESTORIL) capsule 30 mg  30 mg Oral QHS Malvin Johns, MD         Lab Results: No results found for this or any previous visit (from the past 48 hour(s)).  Blood Alcohol level:  Lab Results  Component Value Date   ETH <10 11/11/2019   ETH <10 05/21/2018    Metabolic Disorder Labs: Lab Results  Component Value Date   HGBA1C 5.5 05/24/2018   MPG 111.15 05/24/2018   No results found for: PROLACTIN No results found for: CHOL, TRIG, HDL, CHOLHDL, VLDL, LDLCALC  Physical Findings: AIMS:  , ,  ,  ,    CIWA:    COWS:     Musculoskeletal: Strength & Muscle Tone: within normal limits Gait & Station: normal Patient leans: N/A  Psychiatric Specialty Exam: Physical Exam  Review of Systems  Blood pressure 137/90, pulse 72, temperature 98.7 F (37.1 C), temperature source Oral, resp. rate 18, height 5\' 1"  (1.549 m), weight 80.3 kg, SpO2 99 %.Body mass index is 33.44 kg/m.  General Appearance: Disheveled  Eye Contact:  Fair  Speech:  Pressured  Volume:  Increased  Mood:  manic  Affect:  Congruent  Thought Process:  Disorganized and Irrelevant  Orientation:  Other:  Person place situation  Thought Content:  Illogical and Delusions  Suicidal Thoughts:  No  Homicidal Thoughts:  No  Memory:  Recent;   Poor Remote;   Poor  Judgement:  Impaired  Insight:  Lacking  Psychomotor Activity:  Restlessness  Concentration:  Concentration: Poor and Attention Span: Poor  Recall:  Poor  Fund of Knowledge:  Poor  Language: Pressured rambling delusional  Akathisia:  Negative  Handed:  Right  AIMS (if indicated):     Assets:  Leisure Time Physical Health Resilience  ADL's:  Intact  Cognition:  WNL  Sleep:  Number of Hours: 3.75     Treatment Plan Summary: Daily contact with patient to assess and evaluate symptoms and progress in treatment and Medication management  Patient is refusing medications we will administer Haldol due to acute mania and yelling behaviors, encourage compliance but we need the force medication order No change in  precautions  Rasheem Figiel, MD 11/15/2019, 11:47 AM

## 2019-11-15 NOTE — Progress Notes (Signed)
Recreation Therapy Notes  INPATIENT RECREATION THERAPY ASSESSMENT  Patient Details Name: Patricia [name] MRN: [id number] DOB: 09-24-1970 Today's Date: 11/15/2019       Information Obtained From: Patient  Able to Participate in Assessment/Interview: Yes  Patient Presentation: Alert  Reason for Admission (Per Patient): Suicidal Ideation, Other (Comments)(Pt also stated relapse of emerging symptoms, flu and depression.)  Patient Stressors: Other (Comment)(Housing, looking for a job and disability money)  Coping Skills:   Journal, Sports, Arguments, Aggression, Exercise, Meditate, Deep Breathing, Talk, Art, Prayer, Avoidance, Read, Hot Bath/Shower  Leisure Interests (2+):  Music - Listen, Art - Draw, Exercise - Walking, Individual - Other (Comment), Sports - Exercise (Comment)(Yoga, Auromatherapy)  Frequency of Recreation/Participation: Other (Comment)(Daily)  Awareness of Community Resources:  Yes  Community Resources:  Park, Engineering geologist, Other (Comment)(Basketball court)  Current Use: Yes  If no, Barriers?:    Expressed Interest in [employer] Information: No  [employer] of Residence:  [place]  Patient Main Form of Transportation: Public Transportation(Pt also stated she has a car but can't find the keys and walking.)  Patient Strengths:  Writing; Easy going  Patient Identified Areas of Improvement:  Addressing conflict with people; Watching the weather  Patient Goal for Hospitalization:  "be aware of mood and develop positive strategies to deal with issues"  Current SI (including self-harm):  No  Current HI:  No  Current AVH: No  Staff Intervention Plan: Group Attendance, Collaborate with Interdisciplinary Treatment Team  Consent to Intern Participation: N/A     Caroll Rancher, LRT/CTRS  Caroll Rancher A 11/15/2019, 12:35 PM

## 2019-11-15 NOTE — Progress Notes (Signed)
Dar Note: Patient continues to be manic and refusing prescribed medications.  States she's not here for mental health but need treatment for pneumonia.  Patient is loud and verbally aggressive towards staff.  Unable for follow redirection on the unit.  Haldol 10 mg and Benadryl 50 mg IM given by manual hold due to increased agitation and aggression.   Safety checks continues.

## 2019-11-16 MED ORDER — BENZTROPINE MESYLATE 1 MG PO TABS
1.0000 mg | ORAL_TABLET | Freq: Two times a day (BID) | ORAL | Status: DC
  Administered 2019-11-16 – 2019-11-19 (×8): 1 mg via ORAL
  Filled 2019-11-16 (×7): qty 1
  Filled 2019-11-16: qty 28
  Filled 2019-11-16 (×2): qty 1
  Filled 2019-11-16: qty 28

## 2019-11-16 MED ORDER — CLONAZEPAM 0.5 MG PO TABS
0.5000 mg | ORAL_TABLET | Freq: Two times a day (BID) | ORAL | Status: DC
  Administered 2019-11-16 – 2019-11-19 (×8): 0.5 mg via ORAL
  Filled 2019-11-16 (×8): qty 1

## 2019-11-16 MED ORDER — SULFAMETHOXAZOLE-TRIMETHOPRIM 400-80 MG PO TABS
1.0000 | ORAL_TABLET | Freq: Two times a day (BID) | ORAL | Status: AC
  Administered 2019-11-16 – 2019-11-18 (×6): 1 via ORAL
  Filled 2019-11-16 (×6): qty 1

## 2019-11-16 MED ORDER — HALOPERIDOL LACTATE 5 MG/ML IJ SOLN: 10.0000 mg | Freq: Two times a day (BID) | INTRAMUSCULAR | Status: DC | PRN

## 2019-11-16 MED ORDER — NAPROXEN 250 MG PO TABS
250.0000 mg | ORAL_TABLET | Freq: Two times a day (BID) | ORAL | Status: DC
  Administered 2019-11-16 – 2019-11-19 (×8): 250 mg via ORAL
  Filled 2019-11-16 (×11): qty 1

## 2019-11-16 MED ORDER — RISPERIDONE 2 MG PO TBDP
4.0000 mg | ORAL_TABLET | Freq: Two times a day (BID) | ORAL | Status: DC
  Administered 2019-11-16 – 2019-11-19 (×8): 4 mg via ORAL
  Filled 2019-11-16 (×11): qty 2

## 2019-11-16 NOTE — Progress Notes (Signed)
PT has been since sleeping since the writer got here and have not been able to interact with pt. Pt resting with even and unlabored respiration, will continue to monitor.

## 2019-11-16 NOTE — Progress Notes (Signed)
[facility] MD Progress Note  11/16/2019 8:02 AM Patricia [name]  MRN:  [id number] Subjective:   Patient remains irritable and argumentative and refusing medication stating she has pneumonia and we are not treating her for pneumonia becomes agitated that we are not treating her pneumonia.  She believes her pneumonia has lasted "9 years" speech is rambling and pressured some akathisia noted poor insight Principal Problem: Schizoaffective bipolar type/chronically untreated Diagnosis: Active Problems:   Schizoaffective disorder, bipolar type ([facility])  Total Time spent with patient: 20 minutes  Past Psychiatric History: see eval  Past Medical History:  Past Medical History:  Diagnosis Date  . Depression    History reviewed. No pertinent surgical history. Family History: History reviewed. No pertinent family history. Family Psychiatric  History: see eval Social History:  Social History   Substance and Sexual Activity  Alcohol Use Yes  . Alcohol/week: 1.0 standard drinks  . Types: 1 Glasses of wine per week   Comment: social      Social History   Substance and Sexual Activity  Drug Use No    Social History   Socioeconomic History  . Marital status: Divorced    Spouse name: Not on file  . Number of children: Not on file  . Years of education: Not on file  . Highest education level: Not on file  Occupational History  . Not on file  Tobacco Use  . Smoking status: Current Some Day Smoker    Types: Cigarettes, Cigars  . Smokeless tobacco: Never Used  Substance and Sexual Activity  . Alcohol use: Yes    Alcohol/week: 1.0 standard drinks    Types: 1 Glasses of wine per week    Comment: social   . Drug use: No  . Sexual activity: Not on file  Other Topics Concern  . Not on file  Social History Narrative  . Not on file   Social Determinants of Health   Financial Resource Strain:   . Difficulty of Paying Living Expenses:   Food Insecurity:   . Worried About Patent examiner in the Last Year:   . Barista in the Last Year:   Transportation Needs:   . Freight forwarder (Medical):   Marland Kitchen Lack of Transportation (Non-Medical):   Physical Activity:   . Days of Exercise per Week:   . Minutes of Exercise per Session:   Stress:   . Feeling of Stress :   Social Connections:   . Frequency of Communication with Friends and Family:   . Frequency of Social Gatherings with Friends and Family:   . Attends Religious Services:   . Active Member of Clubs or Organizations:   . Attends Banker Meetings:   Marland Kitchen Marital Status:    Additional Social History:                         Sleep: Fair  Appetite:  Fair  Current Medications: Current Facility-Administered Medications  Medication Dose Route Frequency Provider Last Rate Last Admin  . acetaminophen (TYLENOL) tablet 650 mg  650 mg Oral Q6H PRN Malvin Johns, MD      . alum & mag hydroxide-simeth (MAALOX/MYLANTA) 200-200-20 MG/5ML suspension 30 mL  30 mL Oral Q4H PRN Malvin Johns, MD      . benztropine (COGENTIN) tablet 1 mg  1 mg Oral BID Malvin Johns, MD      . clonazePAM [facility]) tablet 0.5 mg  0.5 mg Oral  BID Johnn Hai, MD      . cloNIDine (CATAPRES) tablet 0.2 mg  0.2 mg Oral Q6H PRN Johnn Hai, MD      . haloperidol lactate (HALDOL) injection 10 mg  10 mg Intramuscular BID PRN Johnn Hai, MD      . LORazepam (ATIVAN) tablet 2 mg  2 mg Oral Q4H PRN Johnn Hai, MD       Or  . LORazepam (ATIVAN) injection 2 mg  2 mg Intramuscular Q4H PRN Johnn Hai, MD      . magnesium hydroxide (MILK OF MAGNESIA) suspension 30 mL  30 mL Oral Daily PRN Johnn Hai, MD      . naproxen (NAPROSYN) tablet 250 mg  250 mg Oral BID WC Johnn Hai, MD      . risperiDONE (RISPERDAL M-TABS) disintegrating tablet 4 mg  4 mg Oral BID Johnn Hai, MD      . sulfamethoxazole-trimethoprim (BACTRIM) 400-80 MG per tablet 1 tablet  1 tablet Oral Q12H Johnn Hai, MD      . temazepam (RESTORIL)  capsule 30 mg  30 mg Oral QHS Johnn Hai, MD        Lab Results: No results found for this or any previous visit (from the past 53 hour(s)).  Blood Alcohol level:  Lab Results  Component Value Date   ETH <10 11/11/2019   ETH <10 96/22/2979    Metabolic Disorder Labs: Lab Results  Component Value Date   HGBA1C 5.5 05/24/2018   MPG 111.15 05/24/2018   No results found for: PROLACTIN No results found for: CHOL, TRIG, HDL, CHOLHDL, VLDL, LDLCALC  Musculoskeletal: Strength & Muscle Tone: within normal limits Gait & Station: normal Patient leans: N/A  Psychiatric Specialty Exam: Physical Exam  Review of Systems  Blood pressure 126/78, pulse 90, temperature 98.7 F (37.1 C), temperature source Oral, resp. rate 18, height 5\' 1"  (1.549 m), weight 80.3 kg, SpO2 99 %.Body mass index is 33.44 kg/m.  General Appearance: Casual  Eye Contact:  Fair  Speech:  Clear and Coherent  Volume:  Increased  Mood:  Angry and Irritable  Affect:  Congruent  Thought Process:  Goal Directed  Orientation:  Full (Time, Place, and Person)  Thought Content:  Illogical and Delusions  Suicidal Thoughts:  No  Homicidal Thoughts:  No  Memory:  Immediate;   Fair Recent;   Fair Remote;   Fair  Judgement:  Impaired  Insight:  Shallow  Psychomotor Activity:  Normal  Concentration:  Concentration: Fair and Attention Span: Fair  Recall:  [employer] of Knowledge:  Fair  Language:  Good  Akathisia:  Yes  Handed:  Right  AIMS (if indicated):     Assets:  Physical Health Resilience  ADL's:  Intact  Cognition:  WNL  Sleep:  Number of Hours: 6.5   Treatment Plan Summary: Daily contact with patient to assess and evaluate symptoms and progress in treatment and Medication management  Continue forced medication order, changed to risperidone, no change in precautions.  We will give antibiotic for bacteriuria as patient is requesting antibiotic   Johnn Hai, MD 11/16/2019, 8:02 AM

## 2019-11-16 NOTE — Progress Notes (Signed)
Recreation Therapy Notes  Date: 4.6.21 Time: 1000 Location: 500 Hall Dayroom  Group Topic: Wellness  Goal Area(s) Addresses:  Patient will define components of whole wellness. Patient will verbalize benefit of whole wellness.  Intervention: Music  Activity:  Exercise.  LRT led group in a series of stretches.  Patients then took turns leading group in exercises of their choosing.  Patients were encouraged to take breaks when needed and drink water as needed.      Education: Wellness, Discharge Planning.   Education Outcome: Acknowledges education/In group clarification offered/Needs additional education.   Clinical Observations/Feedback:  Pt did not attend group.       Female Minish, LRT/CTRS         Patricia [name] A 11/16/2019 11:35 AM 

## 2019-11-16 NOTE — Progress Notes (Signed)
Adult Psychoeducational Group Note  Date:  11/16/2019 Time:  8:28 PM  Group Topic/Focus:  Wrap-Up Group:   The focus of this group is to help patients review their daily goal of treatment and discuss progress on daily workbooks.  Participation Level:  Active  Participation Quality:  Appropriate  Affect:  Appropriate  Cognitive:  Appropriate  Insight: Appropriate  Engagement in Group:  Engaged  Modes of Intervention:  Discussion  Additional Comments:  Pt engaged in discussion during group. Pt goal for today was to have a "good day". Goal for today was achieved. She is sleeping well, has a good appetite and states she has no physical problems or pain. Pt does not endorse SI/ HI at this time and agrees to notify staff if anything was to change.  Sandi Mariscal 11/16/2019, 8:28 PM

## 2019-11-16 NOTE — Progress Notes (Signed)
DAR NOTE: Patient presents with blunted affect and mood.  Denies suicidal thoughts, auditory and visual hallucinations.  Described energy level as normal and concentration as good.  Rates depression at 2, hopelessness at 0, and anxiety at 0.  Maintained on routine safety checks.  Medications given as prescribed.  Support and encouragement offered as needed.  States goal for today is "plan to feel good."  Remained in her room for majority of this shift.    Offered no complaint.

## 2019-11-16 NOTE — Progress Notes (Signed)
   11/16/19 2029  Psych Admission Type (Psych Patients Only)  Admission Status Voluntary  Psychosocial Assessment  Patient Complaints None  Eye Contact Fair  Facial Expression Anxious  Affect Anxious  Speech Logical/coherent  Interaction Assertive  Motor Activity Slow  Appearance/Hygiene Unremarkable  Behavior Characteristics Anxious  Thought Process  Coherency Unable to assess  Content UTA  Delusions UTA  Perception UTA  Hallucination None reported or observed  Judgment Poor  Confusion None  Danger to Self  Current suicidal ideation? Denies  Danger to Others  Danger to Others None reported or observed

## 2019-11-17 NOTE — Progress Notes (Signed)
   11/17/19 1945  Psych Admission Type (Psych Patients Only)  Admission Status Voluntary  Psychosocial Assessment  Patient Complaints None  Eye Contact Fair  Facial Expression Anxious  Affect Anxious  Speech Logical/coherent  Interaction Assertive  Motor Activity Slow  Appearance/Hygiene Unremarkable  Behavior Characteristics Cooperative;Anxious  Mood Pleasant;Anxious  Thought Process  Coherency WDL  Content Delusions  Delusions Somatic (believes she has pneumonia)  Perception WDL  Hallucination None reported or observed  Judgment Poor  Confusion None  Danger to Self  Current suicidal ideation? Denies  Danger to Others  Danger to Others None reported or observed   Pt seen in dayroom. Pt states that she feels better today. She also stated, "my lungs feel clearer today." Pt took medication without incident.

## 2019-11-17 NOTE — Progress Notes (Signed)
PT is alert and oriented to person, place, year and month, but not to date or situation. Pt has an overly bright affect, has been coloring pictures in her room, is overly social with staff and peers, intrusive at times, irritable at other times, yells down the hall at the NP with an overly bright smile and affect stating, "you know I still have pneumonia!" Per shift report pt has been having a fixed delusion about having pneumonia x9 years. No distress noted or reported. Respirations within normal limits. Pt has poor insight, unable to report reason for admission. Pt denies suicidal and homicidal ideation, denies hallucinations, denies feelings of depression and anxiety. Will continue to monitor pt per Q15 minute face checks and monitor for safety and progress.

## 2019-11-17 NOTE — [facility] Group Notes (Signed)
Overcoming Obstacles  11/17/2019 1PM  Type of Therapy and Topic:  Group Therapy:  Overcoming Obstacles  Participation Level:  Active    Description of Group:    In this group patients will be encouraged to explore what they see as obstacles to their own wellness and recovery. They will be guided to discuss their thoughts, feelings, and behaviors related to these obstacles. The group will process together ways to cope with barriers, with attention given to specific choices patients can make. Each patient will be challenged to identify changes they are motivated to make in order to overcome their obstacles. This group will be process-oriented, with patients participating in exploration of their own experiences as well as giving and receiving support and challenge from other group members.   Therapeutic Goals: 1. Patient will identify personal and current obstacles as they relate to admission. 2. Patient will identify barriers that currently interfere with their wellness or overcoming obstacles.  3. Patient will identify feelings, thought process and behaviors related to these barriers. 4. Patient will identify two changes they are willing to make to overcome these obstacles:      Summary of Patient Progress Pt actively and appropriately participated in group session. Pt identified finding a part time job as a task she would like to accomplish. Pt demonstrated insight and was able to identify healthy coping methods such as speaking with a friend, journaling and writing poetry as ways to manage her emotions. Pt respected boundaries during session and interacted appropriately with group members.    Therapeutic Modalities:   Cognitive Behavioral Therapy Solution Focused Therapy Motivational Interviewing Relapse Prevention Therapy    Lowella Dandy, MSW, LCSW 11/17/2019 2:00 PM

## 2019-11-17 NOTE — Progress Notes (Signed)
Adult Psychoeducational Group Note  Date:  11/17/2019 Time:  8:59 PM  Group Topic/Focus:  Wrap-Up Group:   The focus of this group is to help patients review their daily goal of treatment and discuss progress on daily workbooks.  Participation Level:  Active  Participation Quality:  Appropriate  Affect:  Appropriate  Cognitive:  Oriented  Insight: Appropriate  Engagement in Group:  Engaged  Modes of Intervention:  Education and Support  Additional Comments:  Patient attended and participated in group tonight. She reports having a good day. She attended groups, spoke with her doctor and went for meals.  Lita Mains [facility] 11/17/2019, 8:59 PM

## 2019-11-17 NOTE — Progress Notes (Signed)
[facility] MD Progress Note  11/17/2019 9:46 AM Patricia [name]  MRN:  [id number] Subjective:   Continues to express grandiose delusions more contained in mood still hypomanic in presentation.  No EPS or TD.  No thoughts of harming self or others Principal Problem: see eval Diagnosis: Active Problems:   Schizoaffective disorder, bipolar type ([place])  Total Time spent with patient: 20 minutes  Past Psychiatric History: see eval  Past Medical History:  Past Medical History:  Diagnosis Date  . Depression    History reviewed. No pertinent surgical history. Family History: History reviewed. No pertinent family history. Family Psychiatric  History: sx Social History:  Social History   Substance and Sexual Activity  Alcohol Use Yes  . Alcohol/week: 1.0 standard drinks  . Types: 1 Glasses of wine per week   Comment: social      Social History   Substance and Sexual Activity  Drug Use No    Social History   Socioeconomic History  . Marital status: Divorced    Spouse name: Not on file  . Number of children: Not on file  . Years of education: Not on file  . Highest education level: Not on file  Occupational History  . Not on file  Tobacco Use  . Smoking status: Current Some Day Smoker    Types: Cigarettes, Cigars  . Smokeless tobacco: Never Used  Substance and Sexual Activity  . Alcohol use: Yes    Alcohol/week: 1.0 standard drinks    Types: 1 Glasses of wine per week    Comment: social   . Drug use: No  . Sexual activity: Not on file  Other Topics Concern  . Not on file  Social History Narrative  . Not on file   Social Determinants of Health   Financial Resource Strain:   . Difficulty of Paying Living Expenses:   Food Insecurity:   . Worried About Charity fundraiser in the Last Year:   . Arboriculturist in the Last Year:   Transportation Needs:   . Film/video editor (Medical):   Marland Kitchen Lack of Transportation (Non-Medical):   Physical Activity:   . Days of  Exercise per Week:   . Minutes of Exercise per Session:   Stress:   . Feeling of Stress :   Social Connections:   . Frequency of Communication with Friends and Family:   . Frequency of Social Gatherings with Friends and Family:   . Attends Religious Services:   . Active Member of Clubs or Organizations:   . Attends Archivist Meetings:   Marland Kitchen Marital Status:    Additional Social History:                         Sleep: Fair  Appetite:  Fair  Current Medications: Current Facility-Administered Medications  Medication Dose Route Frequency Provider Last Rate Last Admin  . acetaminophen (TYLENOL) tablet 650 mg  650 mg Oral Q6H PRN Johnn Hai, MD      . alum & mag hydroxide-simeth (MAALOX/MYLANTA) 200-200-20 MG/5ML suspension 30 mL  30 mL Oral Q4H PRN Johnn Hai, MD      . benztropine (COGENTIN) tablet 1 mg  1 mg Oral BID Johnn Hai, MD   1 mg at 11/17/19 5035  . clonazePAM (KLONOPIN) tablet 0.5 mg  0.5 mg Oral BID Johnn Hai, MD   0.5 mg at 11/17/19 4656  . cloNIDine (CATAPRES) tablet 0.2 mg  0.2 mg Oral  Q6H PRN Malvin Johns, MD      . haloperidol lactate (HALDOL) injection 10 mg  10 mg Intramuscular BID PRN Malvin Johns, MD      . LORazepam (ATIVAN) tablet 2 mg  2 mg Oral Q4H PRN Malvin Johns, MD       Or  . LORazepam (ATIVAN) injection 2 mg  2 mg Intramuscular Q4H PRN Malvin Johns, MD      . magnesium hydroxide (MILK OF MAGNESIA) suspension 30 mL  30 mL Oral Daily PRN Malvin Johns, MD      . naproxen (NAPROSYN) tablet 250 mg  250 mg Oral BID WC Malvin Johns, MD   250 mg at 11/17/19 1950  . risperiDONE (RISPERDAL M-TABS) disintegrating tablet 4 mg  4 mg Oral BID Malvin Johns, MD   4 mg at 11/17/19 0831  . sulfamethoxazole-trimethoprim (BACTRIM) 400-80 MG per tablet 1 tablet  1 tablet Oral Q12H Malvin Johns, MD   1 tablet at 11/17/19 [phone]  . temazepam (RESTORIL) capsule 30 mg  30 mg Oral QHS Malvin Johns, MD   30 mg at 11/16/19 2049    Lab Results: No results  found for this or any previous visit (from the past 48 hour(s)).  Blood Alcohol level:  Lab Results  Component Value Date   ETH <10 11/11/2019   ETH <10 05/21/2018    Metabolic Disorder Labs: Lab Results  Component Value Date   HGBA1C 5.5 05/24/2018   MPG 111.15 05/24/2018   No results found for: PROLACTIN No results found for: CHOL, TRIG, HDL, CHOLHDL, VLDL, LDLCALC  Musculoskeletal: Strength & Muscle Tone: within normal limits Gait & Station: normal Patient leans: N/A  Psychiatric Specialty Exam: Physical Exam  Review of Systems  Blood pressure (!) 130/91, pulse 98, temperature 98.7 F (37.1 C), temperature source Oral, resp. rate 18, height 5\' 1"  (1.549 m), weight 80.3 kg, SpO2 98 %.Body mass index is 33.44 kg/m.  General Appearance: Casual  Eye Contact:  Fair  Speech:  Pressured  Volume:  Increased  Mood:  hypomanic  Affect:  Congruent and Full Range  Thought Process:  Irrelevant and Descriptions of Associations: Loose  Orientation:  Full (Time, Place, and Person)  Thought Content:  Illogical and Delusions  Suicidal Thoughts:  No  Homicidal Thoughts:  No  Memory:  Recent;   Fair Remote;   Fair  Judgement:  Impaired  Insight:  Shallow  Psychomotor Activity:  Normal  Concentration:  Concentration: Fair and Attention Span: Fair  Recall:  of Knowledge:  Fair  Language:  nl  Akathisia:  Negative  Handed:  Right  AIMS (if indicated):     Assets:  Physical Health Resilience Social Support  ADL's:  Intact  Cognition:  WNL  Sleep:  Number of Hours: 6.25   Treatment Plan Summary: Daily contact with patient to assess and evaluate symptoms and progress in treatment and Medication management  Continue reality based therapy no change in precautions or medications today continue force meds as needed  Obryan Radu, MD 11/17/2019, 9:46 AM

## 2019-11-17 NOTE — Progress Notes (Signed)
Recreation Therapy Notes  Date: 4.7.21 Time: 1000 Location: 500   Group Topic: Leisure Social worker) Addresses:  Patient will identify positive leisure activities.  Patient will identify one positive benefit of participation in leisure activities.   Behavioral Response: Engaged  Intervention: Leisure Group Game  Activity: LRT and patients played some rounds of Dominoes.  Each participant was to get seven dominoes.  The first person to pick their dominoes got the first turn.  One each turn, the player was to match up their piece to one of the pieces that was already down.  The person to who got rid of all their pieces first, wins the game.  Education:  Leisure Education, Building control surveyor  Education Outcome: Acknowledges education/In group clarification offered/Needs additional education  Clinical Observations/Feedback: Pt was engaged and appropriate during group session.  Pt explained the rules to the group and demonstrated how to play the game.  Pt was social with peers, bright and on task.   Caroll Rancher, LRT/CTRS    Lillia Abed, Waris Rodger A 11/17/2019 11:10 AM

## 2019-11-18 MED ORDER — ARIPIPRAZOLE 2 MG PO TABS
2.0000 mg | ORAL_TABLET | Freq: Once | ORAL | Status: AC
  Administered 2019-11-18: 2 mg via ORAL
  Filled 2019-11-18: qty 1

## 2019-11-18 NOTE — Progress Notes (Signed)
[facility] MD Progress Note  11/18/2019 8:23 AM Patricia [name]  MRN:  [id number] Subjective:  This is a repeat admission for Patricia [name], she is 49 years of age and is known to have a schizoaffective type condition.  She presented 4/1 reporting she had suicidal thoughts, further presentation revealed mania, disjointed statements and denies and variably delusional thoughts.  Further history revealed chronic poor compliance-  The patient is more contained behaviorally and as far as mood she is only hypomanic, not manic that she still has some disjointed but not formed delusional speech she gives me variable answers as to her living situation claims she has "3 houses" in [place] she can stay at  Principal Problem: <principal problem not specified> Diagnosis: Active Problems:   Schizoaffective disorder, bipolar type ([place])  Total Time spent with patient: 20 minutes  Past Psychiatric History: see eval  Past Medical History:  Past Medical History:  Diagnosis Date  . Depression    History reviewed. No pertinent surgical history. Family History: History reviewed. No pertinent family history. Family Psychiatric  History: see eval Social History:  Social History   Substance and Sexual Activity  Alcohol Use Yes  . Alcohol/week: 1.0 standard drinks  . Types: 1 Glasses of wine per week   Comment: social      Social History   Substance and Sexual Activity  Drug Use No    Social History   Socioeconomic History  . Marital status: Divorced    Spouse name: Not on file  . Number of children: Not on file  . Years of education: Not on file  . Highest education level: Not on file  Occupational History  . Not on file  Tobacco Use  . Smoking status: Current Some Day Smoker    Types: Cigarettes, Cigars  . Smokeless tobacco: Never Used  Substance and Sexual Activity  . Alcohol use: Yes    Alcohol/week: 1.0 standard drinks    Types: 1 Glasses of wine per week    Comment: social   .  Drug use: No  . Sexual activity: Not on file  Other Topics Concern  . Not on file  Social History Narrative  . Not on file   Social Determinants of Health   Financial Resource Strain:   . Difficulty of Paying Living Expenses:   Food Insecurity:   . Worried About Charity fundraiser in the Last Year:   . Arboriculturist in the Last Year:   Transportation Needs:   . Film/video editor (Medical):   Marland Kitchen Lack of Transportation (Non-Medical):   Physical Activity:   . Days of Exercise per Week:   . Minutes of Exercise per Session:   Stress:   . Feeling of Stress :   Social Connections:   . Frequency of Communication with Friends and Family:   . Frequency of Social Gatherings with Friends and Family:   . Attends Religious Services:   . Active Member of Clubs or Organizations:   . Attends Archivist Meetings:   Marland Kitchen Marital Status:    Additional Social History:                         Sleep: Fair  Appetite:  Fair  Current Medications: Current Facility-Administered Medications  Medication Dose Route Frequency Provider Last Rate Last Admin  . acetaminophen (TYLENOL) tablet 650 mg  650 mg Oral Q6H PRN Johnn Hai, MD      .  alum & mag hydroxide-simeth (MAALOX/MYLANTA) 200-200-20 MG/5ML suspension 30 mL  30 mL Oral Q4H PRN Malvin Johns, MD      . benztropine (COGENTIN) tablet 1 mg  1 mg Oral BID Malvin Johns, MD   1 mg at 11/18/19 0759  . clonazePAM (KLONOPIN) tablet 0.5 mg  0.5 mg Oral BID Malvin Johns, MD   0.5 mg at 11/18/19 0759  . cloNIDine (CATAPRES) tablet 0.2 mg  0.2 mg Oral Q6H PRN Malvin Johns, MD      . haloperidol lactate (HALDOL) injection 10 mg  10 mg Intramuscular BID PRN Malvin Johns, MD      . LORazepam (ATIVAN) tablet 2 mg  2 mg Oral Q4H PRN Malvin Johns, MD       Or  . LORazepam (ATIVAN) injection 2 mg  2 mg Intramuscular Q4H PRN Malvin Johns, MD      . magnesium hydroxide (MILK OF MAGNESIA) suspension 30 mL  30 mL Oral Daily PRN Malvin Johns, MD      . naproxen (NAPROSYN) tablet 250 mg  250 mg Oral BID WC Malvin Johns, MD   250 mg at 11/18/19 0759  . risperiDONE (RISPERDAL M-TABS) disintegrating tablet 4 mg  4 mg Oral BID Malvin Johns, MD   4 mg at 11/18/19 0759  . sulfamethoxazole-trimethoprim (BACTRIM) 400-80 MG per tablet 1 tablet  1 tablet Oral Q12H Malvin Johns, MD   1 tablet at 11/18/19 0759  . temazepam (RESTORIL) capsule 30 mg  30 mg Oral QHS Malvin Johns, MD   30 mg at 11/17/19 2048    Lab Results: No results found for this or any previous visit (from the past 48 hour(s)).  Blood Alcohol level:  Lab Results  Component Value Date   ETH <10 11/11/2019   ETH <10 05/21/2018    Metabolic Disorder Labs: Lab Results  Component Value Date   HGBA1C 5.5 05/24/2018   MPG 111.15 05/24/2018   No results found for: PROLACTIN No results found for: CHOL, TRIG, HDL, CHOLHDL, VLDL, LDLCALC  Physical Findings: AIMS:  , ,  ,  ,    CIWA:    COWS:     Musculoskeletal: Strength & Muscle Tone: within normal limits Gait & Station: normal Patient leans: N/A  Psychiatric Specialty Exam: Physical Exam  Review of Systems  Blood pressure 118/77, pulse (!) 102, temperature 98.7 F (37.1 C), temperature source Oral, resp. rate 18, height 5\' 1"  (1.549 m), weight 80.3 kg, SpO2 98 %.Body mass index is 33.44 kg/m.  General Appearance: Generally intact/casual  Eye Contact:  Fair  Speech:  Clear and Coherent  Volume:  Increased  Mood:  Hypomanic  Affect:  Congruent  Thought Process:  Irrelevant and Descriptions of Associations: Loose  Orientation:  Full (Time, Place, and Person)  Thought Content:  Illogical and Delusions  Suicidal Thoughts:  No  Homicidal Thoughts:  No  Memory:  Immediate;   Fair Recent;   Fair Remote;   Fair  Judgement:  Impaired  Insight:  Shallow  Psychomotor Activity:  Normal  Concentration:  Concentration: Fair and Attention Span: Fair  Recall:  of Knowledge:  Fair  Language:  Fair   Akathisia:  Negative  Handed:  Right  AIMS (if indicated):     Assets:  Communication Skills Desire for Improvement  ADL's:  Intact  Cognition:  WNL  Sleep:  Number of Hours: 4    Treatment Plan Summary: Daily contact with patient to assess and evaluate symptoms and progress  in treatment and Medication management  Continue temazepam for insomnia continue current cognitive therapy and reality based therapy discussed with team and social work regarding placement clearly in need of long-acting injectable, test dose of aripiprazole today  Malvin Johns, MD 11/18/2019, 8:23 AM

## 2019-11-18 NOTE — Progress Notes (Signed)
Recreation Therapy Notes  Date: 4.8.21 Time: 0955 Location: 500 Hall Dayroom  Group Topic: Self-Esteem  Goal Area(s) Addresses:  Patient will successfully identify positive attributes about themselves.  Patient will successfully identify benefit of improved self-esteem.   Behavioral Response: Engaged  Intervention: Magazines, Holiday representative paper, scissors, glue sticks, music  Activity: Collage About Me.  Patients were to use supplies provided to create a collage of things that represent them.  Patients were to show things they like, things they want to accomplish, things that are important to them, etc.  Education:  Self-Esteem, Discharge Planning.   Education Outcome: Acknowledges education/In group clarification offered/Needs additional education  Clinical Observations/Feedback:  Pt was active and social with LRT.  Pt also sang along with some of the songs that played.  Pt showed through collage that she wants to learn how to do cartoons, loves to exercise either with light weights or doing calisthenics, loves clothes with words on them, likes cars and showed all these things could help you to be happier and a social climber.      Caroll Rancher, LRT/CTRS     Caroll Rancher A 11/18/2019 10:49 AM

## 2019-11-18 NOTE — Progress Notes (Signed)
D: Patient Presents with appropriate mood and affect.  Patient was calm and cooperative during med pass and took medicine without incident.  Patient was out in open areas and was social with peers and staff.  Patient asked for clothes to be washed.  Patient denies suicidal thoughts and self harming thoughts.A:  Patient took scheduled medicine.  Support and encouragement provided Routine safety checks conducted every 15 minutes. Patient  Informed to notify staff with any concerns.  Safety maintained.R:  No adverse drug reactions noted.  Patient contracts for safety.  Patient compliant with medication and treatment plan. Patient cooperative and calm. Patient interacts well with others on the unit.  Safety maintained.

## 2019-11-18 NOTE — Progress Notes (Signed)
   11/18/19 2128  Psych Admission Type (Psych Patients Only)  Admission Status Voluntary  Psychosocial Assessment  Patient Complaints None  Eye Contact Fair  Facial Expression Anxious  Affect Anxious  Speech Logical/coherent  Interaction Assertive  Motor Activity Slow  Appearance/Hygiene Unremarkable  Behavior Characteristics Cooperative;Anxious  Mood Pleasant  Thought Process  Coherency WDL  Content Delusions  Delusions Somatic (believes she's had pneumonia for 9 years)  Perception WDL  Hallucination None reported or observed  Judgment Poor  Confusion Mild  Danger to Self  Current suicidal ideation? Denies  Danger to Others  Danger to Others None reported or observed   Pt seen in dayroom singing. Pt states that she has had a good day. Pt takes medications without incident.

## 2019-11-18 NOTE — Progress Notes (Signed)
   11/18/19 0601  Vital Signs  Pulse Rate (!) 102  BP 118/77  BP Location Right Arm  BP Method Automatic  Patient Position (if appropriate) Standing

## 2019-11-19 MED ORDER — ARIPIPRAZOLE ER 400 MG IM SRER
400.0000 mg | INTRAMUSCULAR | Status: DC
  Administered 2019-11-19: 400 mg via INTRAMUSCULAR

## 2019-11-19 MED ORDER — TEMAZEPAM 30 MG PO CAPS: 30.0000 mg | ORAL_CAPSULE | Freq: Every day | ORAL | 0 refills | Status: DC

## 2019-11-19 MED ORDER — ARIPIPRAZOLE ER 400 MG IM SRER: 400.0000 mg | INTRAMUSCULAR | 11 refills | Status: DC

## 2019-11-19 MED ORDER — RISPERIDONE 4 MG PO TABS: ORAL_TABLET | ORAL | 2 refills | Status: DC

## 2019-11-19 MED ORDER — RISPERIDONE 2 MG PO TABS
4.0000 mg | ORAL_TABLET | Freq: Every day | ORAL | Status: DC
  Filled 2019-11-19: qty 28

## 2019-11-19 MED ORDER — BENZTROPINE MESYLATE 1 MG PO TABS: 1.0000 mg | ORAL_TABLET | Freq: Two times a day (BID) | ORAL | 2 refills | Status: DC

## 2019-11-19 NOTE — Progress Notes (Signed)
  [facility] Adult Case Management Discharge Plan :  Will you be returning to the same living situation after discharge:  No. Staying with family. At discharge, do you have transportation home?: Yes,  family picking up after 12:00pm. Do you have the ability to pay for your medications: Yes,  Medicare.  Release of information consent forms completed and in the chart;  Patient's signature needed at discharge.  Patient to Follow up at: Follow-up Information    [facility], [employer]. Go to.   Specialty: Professional Counselor Why: For patients who have not seen this provider in the last 90 days, please use their Walk in hours:  8:30 am to 12:00 pm and 1:00 pm to 2:30 pm Monday through Fridays.  This is not an appointment, walk in hours only. Contact information: [employer] of the [place] [address] [place] [address] [phone]        [facility]. Call.   Why: Please contact your provider to schedule an appointment.  Contact information: [address] [place], [place] [address]  P:  [phone] F:            Next level of care provider has access to [facility] Link:no  Safety Planning and Suicide Prevention discussed: Yes,  with patient. Declined consents.  Has patient been referred to the Quitline?: N/A patient is not a smoker  Patient has been referred for addiction treatment: Yes  Darreld Mclean, LCSWA 11/19/2019, 10:41 AM

## 2019-11-19 NOTE — BHH Suicide Risk Assessment (Signed)
[facility] Discharge Suicide Risk Assessment   Principal Problem: psychosis Discharge Diagnoses: Active Problems:   Schizoaffective disorder, bipolar type ([facility])   Total Time spent with patient: 45 minutes  Musculoskeletal: Strength & Muscle Tone: within normal limits Gait & Station: normal Patient leans: N/A  Psychiatric Specialty Exam: Review of Systems  Blood pressure 120/79, pulse 93, temperature 98.7 F (37.1 C), temperature source Oral, resp. rate 18, height 5\' 1"  (1.549 m), weight 80.3 kg, SpO2 98 %.Body mass index is 33.44 kg/m.  General Appearance: Casual  Eye Contact::  Good  Speech:  Clear and Coherent409  Volume:  Normal  Mood:  Euthymic  Affect:  Congruent  Thought Process:  Linear  Orientation:  Full (Time, Place, and Person)  Thought Content:  Tangential  Suicidal Thoughts:  No  Homicidal Thoughts:  No  Memory:  Immediate;   Fair Recent;   Fair Remote;   Fair  Judgement:  Fair  Insight:  Fair  Psychomotor Activity:  Normal  Concentration:  Fair  Recall:  [id number] of Knowledge:Fair  Language: Fair  Akathisia:  Negative  Handed:  Right  AIMS (if indicated):     Assets:  Communication Skills Desire for Improvement  Sleep:  Number of Hours: 6.25  Cognition: WNL  ADL's:  Intact   Mental Status Per Nursing Assessment::   On Admission:  Self-harm thoughts  Demographic Factors:  Unemployed  Loss Factors: Decrease in vocational status  Historical Factors: NA  Risk Reduction Factors:   Positive social support and Positive therapeutic relationship  Continued Clinical Symptoms:  Previous Psychiatric Diagnoses and Treatments  Cognitive Features That Contribute To Risk:  Polarized thinking    Suicide Risk:  Minimal: No identifiable suicidal ideation.  Patients presenting with no risk factors but with morbid ruminations; may be classified as minimal risk based on the severity of the depressive symptoms  Follow-up Information    [facility], [place]. Go to.   Specialty: Professional Counselor Why: For patients who have not seen this provider in the last 90 days, please use their Walk in hours:  8:30 am to 12:00 pm and 1:00 pm to 2:30 pm Monday through Fridays.  This is not an appointment, walk in hours only. Contact information: 12-30-1991 of the [employer] [address] [place] [phone]        [facility] Follow up.   Why: they do not show her as a patient there Contact information: [address] [place], [place]  P:  [phone] F:            Plan Of Care/Follow-up recommendations:  Activity:  full  Orlando Devereux, MD 11/19/2019, 9:29 AM

## 2019-11-19 NOTE — Progress Notes (Signed)
RN met with pt and reviewed pt's discharge instructions.  Pt verbalized understanding of discharge instructions and pt did not have any questions. RN returned pt's belongings to pt.  Prescriptions and samples were given to pt.  Pt denies SI/HI and voiced no concerns.  Pt was appreciative of the care pt received at [facility].  Patient discharged to the lobby where taxi cab was waiting to take pt home.  RN provided voucher to driver.

## 2019-11-19 NOTE — BHH Group Notes (Signed)
LCSW Wellness Group Note   11/19/2019 11:00am  Type of Group and Topic: Psychoeducational Group:  Wellness  Participation Level:  Active  Description of Group  Wellness group introduces the topic and its focus on developing healthy habits across the spectrum and its relationship to a decrease in hospital admissions.  Six areas of wellness are discussed: physical, social spiritual, intellectual, occupational, and emotional.  Patients are asked to consider their current wellness habits and to identify areas of wellness where they are interested and able to focus on improvements.    Therapeutic Goals 1. Patients will understand components of wellness and how they can positively impact overall health.  2. Patients will identify areas of wellness where they have developed good habits. 3. Patients will identify areas of wellness where they would like to make improvements.    Summary of Patient Progress: Pt very engaged and active in group, read aloud for the group from the handoff and made a number of good comments.  Pt shared that wellness areas that she was in a good place on are social and intellectual and wellness areas that could use improvement are financial and environmental.     Therapeutic Modalities: Cognitive Behavioral Therapy Psychoeducation    Lorri Frederick, LCSW

## 2019-11-19 NOTE — Plan of Care (Signed)
Patient was able to identify positive coping skills at completion of recreation therapy group sessions.   Caroll Rancher, LRT/CTRS

## 2019-11-19 NOTE — Progress Notes (Signed)
Recreation Therapy Notes  Date: 4.9.21 Time: 1000 Location: 500 Hall Dayroom Group Topic: Communication, Team Building, Problem Solving  Goal Area(s) Addresses:  Patient will effectively work with peer towards shared goal.  Patient will identify skills used to make activity successful.  Patient will identify how skills used during activity can be used to reach post d/c goals.   Behavioral Response: Engaged  Intervention: STEM Activity  Activity: Stage manager. In teams patients were given 12 plastic drinking straws and a length of masking tape. Using the materials provided patients were asked to build a landing pad to catch a golf ball dropped from approximately 6 feet in the air.   Education: Pharmacist, community, Discharge Planning   Education Outcome: Acknowledges education/In group clarification offered/Needs additional education.   Clinical Observations/Feedback: Pt worked well with Theatre stage manager in coming up with a landing pad.  Pt was bright, social and active during activity.  Pt stated the group used archetectural skills as well as basket weaving skills.  Pt expressed using these same skills with your support system will allow for cooperation.     Caroll Rancher, LRT/CTRS    Lillia Abed, Kalista Laguardia A 11/19/2019 11:30 AM

## 2019-11-19 NOTE — Progress Notes (Signed)
CSW arranged taxi for the patient to discharge home. [facility] will be here at 7:30pm.   Drucilla Schmidt, MSW, LCSW-A Clinical Disposition Social Worker [facility]/TTS [phone]

## 2019-11-19 NOTE — Tx Team (Signed)
Interdisciplinary Treatment and Diagnostic Plan Update  11/19/2019 Time of Session: 8:40am  Patricia [name] MRN: [id number]  Principal Diagnosis: <principal problem not specified>  Secondary Diagnoses: Active Problems:   Schizoaffective disorder, bipolar type (HCC)   Current Medications:  Current Facility-Administered Medications  Medication Dose Route Frequency Provider Last Rate Last Admin  . acetaminophen (TYLENOL) tablet 650 mg  650 mg Oral Q6H PRN Malvin Johns, MD      . alum & mag hydroxide-simeth (MAALOX/MYLANTA) 200-200-20 MG/5ML suspension 30 mL  30 mL Oral Q4H PRN Malvin Johns, MD      . ARIPiprazole ER (ABILIFY MAINTENA) injection 400 mg  400 mg Intramuscular Q28 days Malvin Johns, MD      . benztropine (COGENTIN) tablet 1 mg  1 mg Oral BID Malvin Johns, MD   1 mg at 11/19/19 0827  . clonazePAM (KLONOPIN) tablet 0.5 mg  0.5 mg Oral BID Malvin Johns, MD   0.5 mg at 11/19/19 0827  . cloNIDine (CATAPRES) tablet 0.2 mg  0.2 mg Oral Q6H PRN Malvin Johns, MD      . haloperidol lactate (HALDOL) injection 10 mg  10 mg Intramuscular BID PRN Malvin Johns, MD      . LORazepam (ATIVAN) tablet 2 mg  2 mg Oral Q4H PRN Malvin Johns, MD       Or  . LORazepam (ATIVAN) injection 2 mg  2 mg Intramuscular Q4H PRN Malvin Johns, MD      . magnesium hydroxide (MILK OF MAGNESIA) suspension 30 mL  30 mL Oral Daily PRN Malvin Johns, MD      . naproxen (NAPROSYN) tablet 250 mg  250 mg Oral BID WC Malvin Johns, MD   250 mg at 11/19/19 0827  . risperiDONE (RISPERDAL M-TABS) disintegrating tablet 4 mg  4 mg Oral BID Malvin Johns, MD   4 mg at 11/19/19 0827  . temazepam (RESTORIL) capsule 30 mg  30 mg Oral QHS Malvin Johns, MD   30 mg at 11/18/19 2042   PTA Medications: Medications Prior to Admission  Medication Sig Dispense Refill Last Dose  . cetirizine (ZYRTEC) 10 MG tablet Take 1 tablet (10 mg total) by mouth daily. (Patient not taking: Reported on 11/11/2019) 30 tablet 0   . fluticasone  (FLONASE) 50 MCG/ACT nasal spray Place 2 sprays into both nostrils daily. (Patient not taking: Reported on 11/11/2019) 16 g 6   . sertraline (ZOLOFT) 50 MG tablet Take 1 tablet (50 mg total) by mouth daily. (Patient not taking: Reported on 11/11/2019) 30 tablet 0   . topiramate (TOPAMAX) 100 MG tablet Take 1 tablet (100 mg total) by mouth at bedtime. (Patient not taking: Reported on 11/11/2019) 30 tablet 1     Patient Stressors: Financial difficulties Health problems Medication change or noncompliance  Patient Strengths: Ability for insight Average or above average intelligence Motivation for treatment/growth  Treatment Modalities: Medication Management, Group therapy, Case management,  1 to 1 session with clinician, Psychoeducation, Recreational therapy.   Physician Treatment Plan for Primary Diagnosis: <principal problem not specified> Long Term Goal(s): Improvement in symptoms so as ready for discharge Improvement in symptoms so as ready for discharge   Short Term Goals: Ability to identify changes in lifestyle to reduce recurrence of condition will improve Ability to verbalize feelings will improve Ability to disclose and discuss suicidal ideas Ability to identify changes in lifestyle to reduce recurrence of condition will improve Ability to verbalize feelings will improve Ability to disclose and discuss suicidal ideas Ability to demonstrate self-control  will improve Ability to identify and develop effective coping behaviors will improve Ability to maintain clinical measurements within normal limits will improve  Medication Management: Evaluate patient's response, side effects, and tolerance of medication regimen.  Therapeutic Interventions: 1 to 1 sessions, Unit Group sessions and Medication administration.  Evaluation of Outcomes: Adequate for Discharge  Physician Treatment Plan for Secondary Diagnosis: Active Problems:   Schizoaffective disorder, bipolar type (HCC)  Long Term  Goal(s): Improvement in symptoms so as ready for discharge Improvement in symptoms so as ready for discharge   Short Term Goals: Ability to identify changes in lifestyle to reduce recurrence of condition will improve Ability to verbalize feelings will improve Ability to disclose and discuss suicidal ideas Ability to identify changes in lifestyle to reduce recurrence of condition will improve Ability to verbalize feelings will improve Ability to disclose and discuss suicidal ideas Ability to demonstrate self-control will improve Ability to identify and develop effective coping behaviors will improve Ability to maintain clinical measurements within normal limits will improve     Medication Management: Evaluate patient's response, side effects, and tolerance of medication regimen.  Therapeutic Interventions: 1 to 1 sessions, Unit Group sessions and Medication administration.  Evaluation of Outcomes: Adequate for Discharge   RN Treatment Plan for Primary Diagnosis: <principal problem not specified> Long Term Goal(s): Knowledge of disease and therapeutic regimen to maintain health will improve  Short Term Goals: Ability to participate in decision making will improve, Ability to verbalize feelings will improve, Ability to disclose and discuss suicidal ideas, Ability to identify and develop effective coping behaviors will improve and Compliance with prescribed medications will improve  Medication Management: RN will administer medications as ordered by provider, will assess and evaluate patient's response and provide education to patient for prescribed medication. RN will report any adverse and/or side effects to prescribing provider.  Therapeutic Interventions: 1 on 1 counseling sessions, Psychoeducation, Medication administration, Evaluate responses to treatment, Monitor vital signs and CBGs as ordered, Perform/monitor CIWA, COWS, AIMS and Fall Risk screenings as ordered, Perform wound care  treatments as ordered.  Evaluation of Outcomes: Adequate for Discharge   LCSW Treatment Plan for Primary Diagnosis: <principal problem not specified> Long Term Goal(s): Safe transition to appropriate next level of care at discharge, Engage patient in therapeutic group addressing interpersonal concerns.  Short Term Goals: Engage patient in aftercare planning with referrals and resources and Increase skills for wellness and recovery  Therapeutic Interventions: Assess for all discharge needs, 1 to 1 time with Social worker, Explore available resources and support systems, Assess for adequacy in community support network, Educate family and significant other(s) on suicide prevention, Complete Psychosocial Assessment, Interpersonal group therapy.  Evaluation of Outcomes: Adequate for Discharge   Progress in Treatment: Attending groups: Yes. Participating in groups: Yes. Taking medication as prescribed: Yes. Toleration medication: Yes. Family/Significant other contact made: Yes, individual(s) contacted:  with pt; pt declined  Patient understands diagnosis: No. Discussing patient identified problems/goals with staff: Yes. Medical problems stabilized or resolved: Yes. Denies suicidal/homicidal ideation: Yes. Issues/concerns per patient self-inventory: No. Other:   New problem(s) identified: Yes, Describe:  housing instability  New Short Term/Long Term Goal(s): Medication stabilization, elimination of SI thoughts, and development of a comprehensive mental wellness plan.   Patient Goals:    Discharge Plan or Barriers: Patient reports she does not intend to report, is agreeable to follow up with [facility] [place].  Reason for Continuation of Hospitalization: Anxiety Delusions  Depression Hallucinations Medication stabilization  Estimated Length of  Stay: Pt is adequate for discharge   Attendees: Patient:  11/19/2019   Physician: Dr. Jake Samples, MD 11/19/2019   Nursing: Gilberto Better., RN 11/19/2019   RN Care Manager: 11/19/2019   Social Worker: Lurline Idol, LCSW  11/19/2019   Recreational Therapist:  11/19/2019   Other: Ovidio Kin, MSW intern  11/19/2019   Other:  11/19/2019   Other: 11/19/2019      Scribe for Treatment Team: Billey Chang, [place] Work 11/19/2019 9:37 AM

## 2019-11-19 NOTE — Progress Notes (Signed)
Recreation Therapy Notes  INPATIENT RECREATION TR PLAN  Patient Details Name: Patricia [name] MRN: [id number] DOB: 1970/09/15 Today's Date: 11/19/2019  Rec Therapy Plan Is patient appropriate for Therapeutic Recreation?: Yes Treatment times per week: about 3 days Estimated Length of Stay: 5-7 days TR Treatment/Interventions: Group participation (Comment)  Discharge Criteria Pt will be discharged from therapy if:: Discharged Treatment plan/goals/alternatives discussed and agreed upon by:: Patient/family  Discharge Summary Short term goals set: See patient care plan Short term goals met: Complete Progress toward goals comments: Groups attended Which groups?: Communication, Self-esteem, Coping skills, Leisure education Reason goals not met: None Therapeutic equipment acquired: N/A Reason patient discharged from therapy: Discharge from hospital Pt/family agrees with progress & goals achieved: Yes Date patient discharged from therapy: 11/19/19    Victorino Sparrow, LRT/CTRS  Ria Comment, Mahkai Fangman A 11/19/2019, 11:50 AM

## 2019-11-19 NOTE — Progress Notes (Addendum)
Pt has been calm this shift.  Interacting with peers and participating in groups. RN administered medications per MD orders and provided support.  Pt has been waiting for her ride to pick her up for the past several hours. Pt has tried calling her relative several times and this RN also called pt's relative to inquire about picking put up since pt has been discharged from [facility].  No answer on the relative's phone.  RN left HIPAA compliant voicemail message asking for relative  to call RN back.    RN waiting for social worker Luther Parody to obtain cab voucher to assist pt to getting home.     11/19/19 1300  Psych Admission Type (Psych Patients Only)  Admission Status Voluntary  Psychosocial Assessment  Patient Complaints None  Eye Contact Fair  Facial Expression Anxious  Affect Anxious  Speech Logical/coherent  Interaction Assertive  Motor Activity Slow  Appearance/Hygiene Unremarkable  Behavior Characteristics Cooperative;Anxious  Mood Pleasant  Thought Process  Coherency WDL  Content Delusions  Delusions Somatic (believes she's had pneumonia for 9 years)  Perception WDL  Hallucination None reported or observed  Judgment Poor  Confusion Mild  Danger to Self  Current suicidal ideation? Denies  Danger to Others  Danger to Others None reported or observed
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
## 2019-11-20 ENCOUNTER — Emergency Department (HOSPITAL_COMMUNITY)
Admission: EM | Admit: 2019-11-20 | Discharge: 2019-11-20 | Discharge status: Against Medical Advice | Payer: Self-pay | Attending: Emergency Medicine | Admitting: Emergency Medicine

## 2019-11-20 ENCOUNTER — Emergency Department (HOSPITAL_COMMUNITY): Payer: Self-pay

## 2019-11-20 ENCOUNTER — Other Ambulatory Visit: Payer: Self-pay

## 2019-11-20 ENCOUNTER — Encounter (HOSPITAL_COMMUNITY): Payer: Self-pay | Admitting: Emergency Medicine

## 2019-11-20 DIAGNOSIS — R5383 Other fatigue: Secondary | ICD-10-CM | POA: Insufficient documentation

## 2019-11-20 DIAGNOSIS — R0602 Shortness of breath: Secondary | ICD-10-CM | POA: Insufficient documentation

## 2019-11-20 DIAGNOSIS — R0789 Other chest pain: Secondary | ICD-10-CM | POA: Insufficient documentation

## 2019-11-20 DIAGNOSIS — R42 Dizziness and giddiness: Secondary | ICD-10-CM | POA: Insufficient documentation

## 2019-11-20 DIAGNOSIS — F250 Schizoaffective disorder, bipolar type: Secondary | ICD-10-CM | POA: Insufficient documentation

## 2019-11-20 DIAGNOSIS — F17210 Nicotine dependence, cigarettes, uncomplicated: Secondary | ICD-10-CM | POA: Insufficient documentation

## 2019-11-20 LAB — BASIC METABOLIC PANEL
Anion gap: 9 (ref 5–15)
BUN: 14 mg/dL (ref 6–20)
CO2: 24 mmol/L (ref 22–32)
Calcium: 9.4 mg/dL (ref 8.9–10.3)
Chloride: 105 mmol/L (ref 98–111)
Creatinine, Ser: 0.63 mg/dL (ref 0.44–1.00)
GFR calc Af Amer: >60 mL/min (ref >60)
GFR calc non Af Amer: >60 mL/min (ref >60)
Glucose, Bld: 118 mg/dL — ABNORMAL HIGH (ref 70–99)
Potassium: 4 mmol/L (ref 3.5–5.1)
Sodium: 138 mmol/L (ref 135–145)

## 2019-11-20 LAB — CBC
HCT: 33.6 % — ABNORMAL LOW (ref 36.0–46.0)
Hemoglobin: 10.5 g/dL — ABNORMAL LOW (ref 12.0–15.0)
MCH: 29.1 pg (ref 26.0–34.0)
MCHC: 31.3 g/dL (ref 30.0–36.0)
MCV: 93.1 fL (ref 80.0–100.0)
Platelets: 224 10*3/uL (ref 150–400)
RBC: 3.61 MIL/uL — ABNORMAL LOW (ref 3.87–5.11)
RDW: 13.1 % (ref 11.5–15.5)
WBC: 6.2 10*3/uL (ref 4.0–10.5)
nRBC: 0 % (ref 0.0–0.2)

## 2019-11-20 LAB — TROPONIN I (HIGH SENSITIVITY): Troponin I (High Sensitivity): 3 ng/L (ref <18)

## 2019-11-20 MED ORDER — SODIUM CHLORIDE 0.9% FLUSH: 3.0000 mL | Freq: Once | INTRAVENOUS | Status: DC

## 2019-11-20 NOTE — ED Triage Notes (Signed)
Pt. Stated, Im feeling SOB, my chest feels tight, Im so tired. This all started 3 days ago.

## 2019-11-20 NOTE — ED Notes (Signed)
Pt noted to be leaving department fully dressed, no distress, steady gait. Encouraged pt to stay, pt stated "fuck this bitch.". No distress noted.

## 2019-11-20 NOTE — [facility] Counselor (Signed)
Attempted to complete the TTS assessment at 1725. Patient's nurse states that she left AMA.

## 2019-11-20 NOTE — ED Provider Notes (Signed)
[facility] EMERGENCY DEPARTMENT Provider Note   CSN: [id number] Arrival date & time: 11/20/19  1438     History Chief Complaint  Patient presents with  . Shortness of Breath  . Chest Pain  . Fatigue  . Dizziness    Patricia [name] is a 49 y.o. female with PMHx depression and schizoaffective disorder who presents to the ED today with complaint of gradual onset, constant, chest tightness that began today. Pt also complains of shortness of breath and fatigue. Pt reports hx of "multiple pneumonias" 1.5 years ago. She states she was told "it takes 3-9 years to get over pneumonia." Pt then reports she tested positive for the flu last week. Per chart review we saw pt on 04/01 for suicidal ideation. She was admitted to the [facility] and discharged yesterday. There was concern for psychosis vs schizophrenia during admission. Pt reports she has not picked up her medications yet but is no longer feeling suicidal. Pt denies any fevers or chills recently.   The history is provided by the patient and medical records.       Past Medical History:  Diagnosis Date  . Depression     Patient Active Problem List   Diagnosis Date Noted  . MDD (major depressive disorder), recurrent episode, severe (HCC) 11/12/2019  . Suicidal ideation   . Schizoaffective disorder, bipolar type (HCC)   . Depression 05/21/2018    History reviewed. No pertinent surgical history.   OB History   No obstetric history on file.     No family history on file.  Social History   Tobacco Use  . Smoking status: Current Some Day Smoker    Types: Cigarettes, Cigars  . Smokeless tobacco: Never Used  Substance Use Topics  . Alcohol use: Yes    Alcohol/week: 1.0 standard drinks    Types: 1 Glasses of wine per week    Comment: social   . Drug use: No    Home Medications Prior to Admission medications   Medication Sig Start Date End Date Taking? Authorizing Provider  ARIPiprazole ER (ABILIFY  MAINTENA) 400 MG SRER injection Inject 2 mLs (400 mg total) into the muscle every 28 (twenty-eight) days. Due 5/3 11/19/19   Malvin Johns, MD  benztropine (COGENTIN) 1 MG tablet Take 1 tablet (1 mg total) by mouth 2 (two) times daily. 11/19/19   Malvin Johns, MD  cetirizine (ZYRTEC) 10 MG tablet Take 1 tablet (10 mg total) by mouth daily. Patient not taking: Reported on 11/11/2019 10/07/18   Storm Frisk, MD  fluticasone [facility]) 50 MCG/ACT nasal spray Place 2 sprays into both nostrils daily. Patient not taking: Reported on 11/11/2019 10/07/18   Storm Frisk, MD  risperidone (RISPERDAL) 4 MG tablet 2 at hs x 2 weeks then 1 q hs 11/19/19   Malvin Johns, MD  temazepam (RESTORIL) 30 MG capsule Take 1 capsule (30 mg total) by mouth at bedtime. 11/19/19   Malvin Johns, MD    Allergies    Oxycodone and Latex  Review of Systems   Review of Systems  Constitutional: Negative for chills and fever.  Respiratory: Positive for shortness of breath.   Cardiovascular: Positive for chest pain.  All other systems reviewed and are negative.   Physical Exam Updated Vital Signs BP 124/69 (BP Location: Right Arm)   Pulse (!) 101   Temp 98.6 F (37 C) (Oral)   Resp 18   Ht 5\' 3"  (1.6 m)   Wt 80.7 kg  LMP 11/05/2019   SpO2 98%   BMI 31.53 kg/m   Physical Exam Vitals and nursing note reviewed.  Constitutional:      Appearance: She is not ill-appearing.  HENT:     Head: Normocephalic and atraumatic.  Eyes:     Conjunctiva/sclera: Conjunctivae normal.  Cardiovascular:     Rate and Rhythm: Regular rhythm. Tachycardia present.  Pulmonary:     Effort: Pulmonary effort is normal.     Breath sounds: Normal breath sounds. No decreased breath sounds, wheezing, rhonchi or rales.  Chest:     Chest wall: No tenderness.  Abdominal:     Palpations: Abdomen is soft.     Tenderness: There is no abdominal tenderness. There is no guarding or rebound.  Musculoskeletal:     Cervical back: Neck supple.      Right lower leg: No edema.     Left lower leg: No edema.  Skin:    General: Skin is warm and dry.  Neurological:     Mental Status: She is alert.     ED Results / Procedures / Treatments   Labs (all labs ordered are listed, but only abnormal results are displayed) Labs Reviewed  BASIC METABOLIC PANEL - Abnormal; Notable for the following components:      Result Value   Glucose, Bld 118 (*)    All other components within normal limits  CBC - Abnormal; Notable for the following components:   RBC 3.61 (*)    Hemoglobin 10.5 (*)    HCT 33.6 (*)    All other components within normal limits  D-DIMER, QUANTITATIVE (NOT AT [facility])  POC OCCULT BLOOD, ED  TROPONIN I (HIGH SENSITIVITY)  TROPONIN I (HIGH SENSITIVITY)    EKG None  Radiology DG Chest 2 View  Result Date: 11/20/2019 CLINICAL DATA:  SOB and chest pain. Pt states recent hospitalization for pneumonia. Shortness of breath, chest pain, recent pneumonia EXAM: CHEST - 2 VIEW COMPARISON:  06/20/2007 FINDINGS: Lungs are clear. Heart size and mediastinal contours are within normal limits. No effusion. Visualized bones unremarkable. IMPRESSION: No acute cardiopulmonary disease. Electronically Signed   By: Lucrezia Europe M.D.   On: 11/20/2019 15:26    Procedures Procedures (including critical care time)  Medications Ordered in ED Medications  sodium chloride flush (NS) 0.9 % injection 3 mL (has no administration in time range)    ED Course  I have reviewed the triage vital signs and the nursing notes.  Pertinent labs & imaging results that were available during my care of the patient were reviewed by me and considered in my medical decision making (see chart for details).    MDM Rules/Calculators/A&P                      49 year old female presenting to the ED today with complaints of chest tightness and SOB that began today. On arrival to the ED pt is afebrile, tachycardic in the low 100s, nontachypneic. She is able to speak in  full sentences without difficulty and appears to be in NAD. Pt does seem to be reacting to internal stimuli. She was discharged from [facility] yesterday after being seen in the ED last week with SI; pt currently denying SI. She is fixated on the fact that she had 3 types of different pneumonias last year and was told it would take "3-9 years to get over." I attempted to have educated discussion with patient regarding the fact that her chest xray is  clear today without signs of pneumonia but pt seems uninterested. Labwork was obtained while pt was in the waiting room   CXR clear.  CBC without leukocytosis. Hgb 10.5 which is decreased 1.7 points from 1 week ago BMP without electrolyte abnormalities Initial troponin of 3  Hemoglobin  Date Value Ref Range Status  11/20/2019 10.5 (L) 12.0 - 15.0 g/dL Final  56/43/3295 18.8 12.0 - 15.0 g/dL Final  41/66/0630 16.0 12.0 - 15.0 g/dL Final  10/93/2355 73.2 (L) 12.0 - 15.0 g/dL Final   Had added d dimer however pt has declined given she "had this test last year." I attempted to educate patient on the fact that it could have been normal last year but elevated today and there is no way of ruling out a PE without this. Pt continues to decline test.   Discussed doing rectal exam to assess for fecal blood however pt declines this as well. Pt requesting a "pneumonia shot" at this time. I do not think she requires antibiotics as her cxr is normal. Have placed TTS consult given concern for possible psychiatric imbalances.   Pt ELOPED. I was unable to discuss risks of leaving prior to full workup being completed.   Final Clinical Impression(s) / ED Diagnoses Final diagnoses:  Shortness of breath    Rx / DC Orders ED Discharge Orders    None       Tanda Rockers, PA-C 11/20/19 1741    Pollyann Savoy, MD 11/20/19 [phone]

## 2019-11-23 ENCOUNTER — Emergency Department (HOSPITAL_COMMUNITY): Payer: Self-pay

## 2019-11-23 ENCOUNTER — Emergency Department (HOSPITAL_COMMUNITY)
Admission: EM | Admit: 2019-11-23 | Discharge: 2019-11-23 | Disposition: A | Discharge status: Home / Self Care | Payer: Self-pay | Attending: Emergency Medicine | Admitting: Emergency Medicine

## 2019-11-23 ENCOUNTER — Encounter (HOSPITAL_COMMUNITY): Payer: Self-pay | Admitting: Emergency Medicine

## 2019-11-23 ENCOUNTER — Other Ambulatory Visit: Payer: Self-pay

## 2019-11-23 DIAGNOSIS — R0789 Other chest pain: Secondary | ICD-10-CM | POA: Insufficient documentation

## 2019-11-23 DIAGNOSIS — Z720 Tobacco use: Secondary | ICD-10-CM | POA: Insufficient documentation

## 2019-11-23 DIAGNOSIS — R0602 Shortness of breath: Secondary | ICD-10-CM

## 2019-11-23 DIAGNOSIS — M7918 Myalgia, other site: Secondary | ICD-10-CM | POA: Insufficient documentation

## 2019-11-23 DIAGNOSIS — F250 Schizoaffective disorder, bipolar type: Secondary | ICD-10-CM | POA: Insufficient documentation

## 2019-11-23 DIAGNOSIS — R110 Nausea: Secondary | ICD-10-CM | POA: Insufficient documentation

## 2019-11-23 DIAGNOSIS — J189 Pneumonia, unspecified organism: Secondary | ICD-10-CM | POA: Insufficient documentation

## 2019-11-23 LAB — CBC
HCT: 35.2 % — ABNORMAL LOW (ref 36.0–46.0)
Hemoglobin: 11.1 g/dL — ABNORMAL LOW (ref 12.0–15.0)
MCH: 28.9 pg (ref 26.0–34.0)
MCHC: 31.5 g/dL (ref 30.0–36.0)
MCV: 91.7 fL (ref 80.0–100.0)
Platelets: 232 10*3/uL (ref 150–400)
RBC: 3.84 MIL/uL — ABNORMAL LOW (ref 3.87–5.11)
RDW: 12.8 % (ref 11.5–15.5)
WBC: 4.6 10*3/uL (ref 4.0–10.5)
nRBC: 0 % (ref 0.0–0.2)

## 2019-11-23 LAB — TROPONIN I (HIGH SENSITIVITY)
Troponin I (High Sensitivity): 8 ng/L (ref <18)
Troponin I (High Sensitivity): 3 ng/L (ref <18)

## 2019-11-23 LAB — BASIC METABOLIC PANEL
Anion gap: 10 (ref 5–15)
BUN: 10 mg/dL (ref 6–20)
CO2: 25 mmol/L (ref 22–32)
Calcium: 9.2 mg/dL (ref 8.9–10.3)
Chloride: 104 mmol/L (ref 98–111)
Creatinine, Ser: 0.82 mg/dL (ref 0.44–1.00)
GFR calc Af Amer: >60 mL/min (ref >60)
GFR calc non Af Amer: >60 mL/min (ref >60)
Glucose, Bld: 117 mg/dL — ABNORMAL HIGH (ref 70–99)
Potassium: 4 mmol/L (ref 3.5–5.1)
Sodium: 139 mmol/L (ref 135–145)

## 2019-11-23 LAB — I-STAT BETA HCG BLOOD, ED (MC, WL, AP ONLY): I-stat hCG, quantitative: <5 m[IU]/mL (ref <5)

## 2019-11-23 LAB — D-DIMER, QUANTITATIVE: D-Dimer, Quant: 0.87 ug/mL-FEU — ABNORMAL HIGH (ref 0.00–0.50)

## 2019-11-23 MED ORDER — IOHEXOL 350 MG/ML SOLN
80.0000 mL | Freq: Once | INTRAVENOUS | Status: AC | PRN
  Administered 2019-11-23: 80 mL via INTRAVENOUS

## 2019-11-23 MED ORDER — BENZTROPINE MESYLATE 1 MG PO TABS
1.0000 mg | ORAL_TABLET | Freq: Once | ORAL | Status: AC
  Administered 2019-11-23: 1 mg via ORAL
  Filled 2019-11-23: qty 1

## 2019-11-23 MED ORDER — DOXYCYCLINE HYCLATE 100 MG PO CAPS: 100.0000 mg | ORAL_CAPSULE | Freq: Two times a day (BID) | ORAL | 0 refills | Status: DC

## 2019-11-23 MED ORDER — AZITHROMYCIN 250 MG PO TABS: 500.0000 mg | ORAL_TABLET | Freq: Once | ORAL | Status: DC

## 2019-11-23 MED ORDER — SODIUM CHLORIDE 0.9% FLUSH: 3.0000 mL | Freq: Once | INTRAVENOUS | Status: DC

## 2019-11-23 MED ORDER — DOXYCYCLINE HYCLATE 100 MG PO TABS
100.0000 mg | ORAL_TABLET | Freq: Once | ORAL | Status: AC
  Administered 2019-11-23: 100 mg via ORAL
  Filled 2019-11-23: qty 1

## 2019-11-23 NOTE — ED Notes (Signed)
Advised Dr. Madilyn Hook that pt refused SARS swab.

## 2019-11-23 NOTE — ED Notes (Signed)
Pt refused SARS coronavirus swab.  She stated "I'm not doing that I've already had the shot". MD made aware.

## 2019-11-23 NOTE — ED Triage Notes (Signed)
Pt arrives via [facility] from home where the call came out for possible allergic reaction. Pt c/o of sob and substernal CP. Pt has no hives or swelling. Pt currently has a pneumonia and is being treated for that.

## 2019-11-23 NOTE — ED Provider Notes (Signed)
[facility] EMERGENCY DEPARTMENT Provider Note   CSN: [id number] Arrival date & time: 11/23/19  1457     History Chief Complaint  Patient presents with  . Shortness of Breath    Patricia [name] is a 49 y.o. female.  The history is provided by the patient and medical records. No language interpreter was used.  Shortness of Breath  [name] is a 49 y.o. female who presents to the Emergency Department complaining of sob. She presents the emergency department complaining of three days of shortness of breath after starting Risperdal. She is also concerned that she has a pneumonia. She started the medication twice daily three days ago and has been experiencing increased shortness of breath since that time. She described as a feeling as if she cannot take a deep breath and she feels stiff all over. She has associated central chest tightness that started today. They reports of fever, cough, leg swelling or pain. She does have some nausea. No abdominal pain, vomiting, diarrhea. She denies any active SI, HI. She smokes occasionally. No alcohol or street drugs.    Past Medical History:  Diagnosis Date  . Depression     Patient Active Problem List   Diagnosis Date Noted  . MDD (major depressive disorder), recurrent episode, severe (HCC) 11/12/2019  . Suicidal ideation   . Schizoaffective disorder, bipolar type (HCC)   . Depression 05/21/2018    No past surgical history on file.   OB History   No obstetric history on file.     No family history on file.  Social History   Tobacco Use  . Smoking status: Current Some Day Smoker    Types: Cigarettes, Cigars  . Smokeless tobacco: Never Used  Substance Use Topics  . Alcohol use: Yes    Alcohol/week: 1.0 standard drinks    Types: 1 Glasses of wine per week    Comment: social   . Drug use: No    Home Medications Prior to Admission medications   Medication Sig Start Date End Date  Taking? Authorizing Provider  risperidone (RISPERDAL) 4 MG tablet 2 at hs x 2 weeks then 1 q hs Patient taking differently: Take 2 mg by mouth 2 (two) times daily.  11/19/19  Yes Malvin Johns, MD  ARIPiprazole ER (ABILIFY MAINTENA) 400 MG SRER injection Inject 2 mLs (400 mg total) into the muscle every 28 (twenty-eight) days. Due 5/3 11/19/19   Malvin Johns, MD  benztropine (COGENTIN) 1 MG tablet Take 1 tablet (1 mg total) by mouth 2 (two) times daily. 11/19/19   Malvin Johns, MD  cetirizine (ZYRTEC) 10 MG tablet Take 1 tablet (10 mg total) by mouth daily. Patient not taking: Reported on 11/11/2019 10/07/18   Storm Frisk, MD  doxycycline (VIBRAMYCIN) 100 MG capsule Take 1 capsule (100 mg total) by mouth 2 (two) times daily. 11/23/19   Tilden Fossa, MD  fluticasone [facility]) 50 MCG/ACT nasal spray Place 2 sprays into both nostrils daily. Patient not taking: Reported on 11/11/2019 10/07/18   Storm Frisk, MD  temazepam (RESTORIL) 30 MG capsule Take 1 capsule (30 mg total) by mouth at bedtime. 11/19/19   Malvin Johns, MD    Allergies    Oxycodone, Latex, and Risperdal [risperidone]  Review of Systems   Review of Systems  Respiratory: Positive for shortness of breath.   All other systems reviewed and are negative.   Physical Exam Updated Vital Signs BP 131/69   Pulse 71   Temp  98.4 F (36.9 C) (Oral)   Resp 17   Ht 5\' 3"  (1.6 m)   Wt 77.1 kg   LMP 11/05/2019   SpO2 97%   BMI 30.11 kg/m   Physical Exam Vitals and nursing note reviewed.  Constitutional:      Appearance: She is well-developed.  HENT:     Head: Normocephalic and atraumatic.  Cardiovascular:     Rate and Rhythm: Normal rate and regular rhythm.     Heart sounds: No murmur.  Pulmonary:     Effort: Pulmonary effort is normal. No respiratory distress.     Breath sounds: Normal breath sounds.  Abdominal:     Palpations: Abdomen is soft.     Tenderness: There is no abdominal tenderness. There is no guarding or  rebound.  Musculoskeletal:        General: No swelling or tenderness.  Skin:    General: Skin is warm and dry.  Neurological:     Mental Status: She is alert and oriented to person, place, and time.  Psychiatric:     Comments: Flat affect. Denies SI, HI.     ED Results / Procedures / Treatments   Labs (all labs ordered are listed, but only abnormal results are displayed) Labs Reviewed  BASIC METABOLIC PANEL - Abnormal; Notable for the following components:      Result Value   Glucose, Bld 117 (*)    All other components within normal limits  CBC - Abnormal; Notable for the following components:   RBC 3.84 (*)    Hemoglobin 11.1 (*)    HCT 35.2 (*)    All other components within normal limits  D-DIMER, QUANTITATIVE (NOT AT [facility]) - Abnormal; Notable for the following components:   D-Dimer, Quant 0.87 (*)    All other components within normal limits  SARS CORONAVIRUS 2 (TAT 6-24 HRS)  I-STAT BETA HCG BLOOD, ED ([facility], [facility], [facility] ONLY)  TROPONIN I (HIGH SENSITIVITY)  TROPONIN I (HIGH SENSITIVITY)    EKG EKG Interpretation  Date/Time:  Tuesday November 23 2019 15:05:11 EDT Ventricular Rate:  96 PR Interval:  122 QRS Duration: 78 QT Interval:  350 QTC Calculation: 442 R Axis:   58 Text Interpretation: Normal sinus rhythm Normal ECG Confirmed by Quintella Reichert [phone]) on 11/23/2019 4:35:00 PM   Radiology DG Chest 2 View  Result Date: 11/23/2019 CLINICAL DATA:  Shortness of breath. EXAM: CHEST - 2 VIEW COMPARISON:  November 21, 2019. FINDINGS: The heart size and mediastinal contours are within normal limits. Both lungs are clear. No pneumothorax or pleural effusion is noted. The visualized skeletal structures are unremarkable. IMPRESSION: No active cardiopulmonary disease. Electronically Signed   By: Marijo Conception M.D.   On: 11/23/2019 15:54   CT Angio Chest PE W/Cm &/Or Wo Cm  Result Date: 11/23/2019 CLINICAL DATA:  Shortness of breath and substernal chest pain. EXAM: CT  ANGIOGRAPHY CHEST WITH CONTRAST TECHNIQUE: Multidetector CT imaging of the chest was performed using the standard protocol during bolus administration of intravenous contrast. Multiplanar CT image reconstructions and MIPs were obtained to evaluate the vascular anatomy. CONTRAST:  26mL OMNIPAQUE IOHEXOL 350 MG/ML SOLN COMPARISON:  None. FINDINGS: Cardiovascular: Satisfactory opacification of the pulmonary arteries to the segmental level. No evidence of pulmonary embolism. Normal heart size. No pericardial effusion. Mediastinum/Nodes: No enlarged mediastinal, hilar, or axillary lymph nodes. Thyroid gland, trachea, and esophagus demonstrate no significant findings. Lungs/Pleura: Very mild atelectasis and/or early infiltrate is seen within the posterior aspects of the bilateral  lower lobes. There is no evidence of a pleural effusion or pneumothorax. Upper Abdomen: No acute abnormality. Musculoskeletal: No chest wall abnormality. No acute or significant osseous findings. Review of the MIP images confirms the above findings. IMPRESSION: 1. No evidence of pulmonary embolus. 2. Very mild bilateral lower lobe atelectasis and/or early infiltrate. Electronically Signed   By: Aram Candela M.D.   On: 11/23/2019 22:06    Procedures Procedures (including critical care time)  Medications Ordered in ED Medications  sodium chloride flush (NS) 0.9 % injection 3 mL (has no administration in time range)  doxycycline (VIBRA-TABS) tablet 100 mg (has no administration in time range)  benztropine (COGENTIN) tablet 1 mg (1 mg Oral Given 11/23/19 1947)  iohexol (OMNIPAQUE) 350 MG/ML injection 80 mL (80 mLs Intravenous Contrast Given 11/23/19 2151)    ED Course  I have reviewed the triage vital signs and the nursing notes.  Pertinent labs & imaging results that were available during my care of the patient were reviewed by me and considered in my medical decision making (see chart for details).    MDM  Rules/Calculators/A&P                     Pt here for evaluation of sob after starting Risperdal as well as chest pain today.  She is nontoxic appearing on exam with no respiratory distress.  Presentation is not c/w anaphylaxis, chf, acs.  Ddimer was elevated and CTA obtained.  CT neg for PE but concerning for pneumonia. Pt states she was recently prescribed abx but did not take them and does not have the prescription - will start doxy.  D/w pt home care for pneumonia as well as outpatient follow up.  Will also test for covid 19 in setting of her sxs and CT findings.    Patricia [name] was evaluated in Emergency Department on 11/23/2019 for the symptoms described in the history of present illness. She was evaluated in the context of the global COVID-19 pandemic, which necessitated consideration that the patient might be at risk for infection with the SARS-CoV-2 virus that causes COVID-19. Institutional protocols and algorithms that pertain to the evaluation of patients at risk for COVID-19 are in a state of rapid change based on information released by regulatory bodies including the CDC and federal and state organizations. These policies and algorithms were followed during the patient's care in the ED.   Final Clinical Impression(s) / ED Diagnoses Final diagnoses:  Shortness of breath  Community acquired pneumonia, unspecified laterality    Rx / DC Orders ED Discharge Orders         Ordered    doxycycline (VIBRAMYCIN) 100 MG capsule  2 times daily     11/23/19 2221           Tilden Fossa, MD 11/23/19 2224

## 2019-11-23 NOTE — ED Notes (Signed)
Attempted to call pts uncle at [phone], kept ringing busy.  Pt attempting to call niece.

## 2019-12-01 ENCOUNTER — Other Ambulatory Visit: Payer: Self-pay | Admitting: Critical Care Medicine

## 2019-12-01 ENCOUNTER — Encounter: Payer: Self-pay | Admitting: Critical Care Medicine

## 2019-12-01 MED ORDER — DOXYCYCLINE HYCLATE 100 MG PO CAPS: 100.0000 mg | ORAL_CAPSULE | Freq: Two times a day (BID) | ORAL | 0 refills | Status: DC

## 2019-12-01 NOTE — Progress Notes (Signed)
Patient ID: Patricia [name], female   DOB: 04/05/1971, 49 y.o.   MRN: [id number] This is a 49 year old female seen for the first time at the [place] she just arrived here 2 days ago previous to this was in a boarding home.  She has history of bipolar disorder, history of major depression prior history of suicidal ideation  The patient previously was being followed [facility] but wishes to establish care here at the clinic  The patient was recently in the emergency room on 13 April found to have bilateral lower lobe pneumonia prescribe doxycycline she could not afford this medicine was never able to pick this medication up.  She also recently in 1 April was admitted to behavioral health hospital for several days for treatment of suicidal ideation she was recommended to be given respite all and once monthly Abilify injections she is taking the respite all but has not followed through on the Abilify injections  The patient states she has fatigue cough and some shortness of breath.  On exam temp is 99 pulse 97 saturation 96% blood pressure 126/82  Chest showed few crackles at the bases cardiac exam showed a regular rate rhythm abdomen was soft nontender extremities show no edema  CT scan of the chest is reviewed from April 13 does show bibasilar infiltrates  Covid study was negative  Impression is that of community-acquired pneumonia plan for the patient received doxycycline 100 mg twice daily for 7 days  We will see this patient back in follow-up in 1 week in this clinic and attempt a get this patient also into my community health and wellness clinic as well we also need to have this patient follow-up with mental health services

## 2019-12-01 NOTE — Progress Notes (Signed)
See note

## 2019-12-01 NOTE — Congregational Nurse Program (Signed)
Ms. Patricia [name] was seen at [facility] walk-in clinic for f/u of dx of pneumonia. Rx will be picked-up at [facility] by CN and delivered. To be scheduled for f/u at [facility] on 4/29

## 2019-12-15 ENCOUNTER — Ambulatory Visit: Payer: Self-pay | Admitting: Critical Care Medicine

## 2019-12-15 NOTE — Progress Notes (Deleted)
   Subjective:    Patient ID: Patricia [name], female    DOB: Mar 19, 1971, 49 y.o.   MRN: [id number]  This is a 49 year old female seen for the first time at the [facility] she just arrived here 2 days ago previous to this was in a boarding home.  She has history of bipolar disorder, history of major depression prior history of suicidal ideation  The patient previously was being followed [facility] but wishes to establish care here at the clinic  The patient was recently in the emergency room on 13 April found to have bilateral lower lobe pneumonia prescribe doxycycline she could not afford this medicine was never able to pick this medication up.  She also recently in 1 April was admitted to behavioral health hospital for several days for treatment of suicidal ideation she was recommended to be given respite all and once monthly Abilify injections she is taking the respite all but has not followed through on the Abilify injections  The patient states she has fatigue cough and some shortness of breath.  On exam temp is 99 pulse 97 saturation 96% blood pressure 126/82  Chest showed few crackles at the bases cardiac exam showed a regular rate rhythm abdomen was soft nontender extremities show no edema  CT scan of the chest is reviewed from April 13 does show bibasilar infiltrates  Covid study was negative  Impression is that of community-acquired pneumonia plan for the patient received doxycycline 100 mg twice daily for 7 days  We will see this patient back in follow-up in 1 week in this clinic and attempt a get this patient also into my community health and wellness clinic as well we also need to have this patient follow-up with mental health services  12/15/2019      Review of Systems     Objective:   Physical Exam        Assessment & Plan:

## 2019-12-29 ENCOUNTER — Encounter (HOSPITAL_COMMUNITY): Payer: Self-pay | Admitting: Emergency Medicine

## 2019-12-29 ENCOUNTER — Emergency Department (HOSPITAL_COMMUNITY): Payer: Self-pay

## 2019-12-29 ENCOUNTER — Emergency Department (HOSPITAL_COMMUNITY)
Admission: EM | Admit: 2019-12-29 | Discharge: 2019-12-29 | Disposition: A | Discharge status: Home / Self Care | Payer: Self-pay | Attending: Emergency Medicine | Admitting: Emergency Medicine

## 2019-12-29 DIAGNOSIS — W4904XA Ring or other jewelry causing external constriction, initial encounter: Secondary | ICD-10-CM | POA: Insufficient documentation

## 2019-12-29 DIAGNOSIS — Y999 Unspecified external cause status: Secondary | ICD-10-CM | POA: Insufficient documentation

## 2019-12-29 DIAGNOSIS — S60459A Superficial foreign body of unspecified finger, initial encounter: Secondary | ICD-10-CM | POA: Insufficient documentation

## 2019-12-29 DIAGNOSIS — X58XXXA Exposure to other specified factors, initial encounter: Secondary | ICD-10-CM | POA: Insufficient documentation

## 2019-12-29 DIAGNOSIS — S6991XA Unspecified injury of right wrist, hand and finger(s), initial encounter: Secondary | ICD-10-CM

## 2019-12-29 DIAGNOSIS — Y929 Unspecified place or not applicable: Secondary | ICD-10-CM | POA: Insufficient documentation

## 2019-12-29 DIAGNOSIS — S60444A External constriction of right ring finger, initial encounter: Secondary | ICD-10-CM | POA: Insufficient documentation

## 2019-12-29 DIAGNOSIS — Z23 Encounter for immunization: Secondary | ICD-10-CM | POA: Insufficient documentation

## 2019-12-29 DIAGNOSIS — Y939 Activity, unspecified: Secondary | ICD-10-CM | POA: Insufficient documentation

## 2019-12-29 MED ORDER — TETANUS-DIPHTH-ACELL PERTUSSIS 5-2.5-18.5 LF-MCG/0.5 IM SUSP
0.5000 mL | Freq: Once | INTRAMUSCULAR | Status: AC
  Administered 2019-12-29: 0.5 mL via INTRAMUSCULAR
  Filled 2019-12-29: qty 0.5

## 2019-12-29 NOTE — ED Provider Notes (Signed)
[facility] EMERGENCY DEPARTMENT Provider Note   CSN: [id number] Arrival date & time: 12/29/19  1217     History No chief complaint on file.   Patricia [name] is a 49 y.o. female.  Patient presents to the ED for evaluation of right ring finger pain. She states she had a small cut on her finger about 3 weeks ago. The area around the prior injury is now hard and painful. She has a ring on the finger directly adjacent to/overlying the injured area. Swelling has progressed to the point that she is unable to remove the ring. No fevers/chills. Able to flex and extend the finger without difficulty.  The history is provided by the patient. No language interpreter was used.  Hand Pain The current episode started more than 1 week ago. The problem has been gradually worsening.       Past Medical History:  Diagnosis Date  . Depression     Patient Active Problem List   Diagnosis Date Noted  . MDD (major depressive disorder), recurrent episode, severe (HCC) 11/12/2019  . Suicidal ideation   . Schizoaffective disorder, bipolar type (HCC)   . Depression 05/21/2018  . Overactive bladder 01/19/2018  . Schizophrenia (HCC) 01/19/2018    History reviewed. No pertinent surgical history.   OB History   No obstetric history on file.     History reviewed. No pertinent family history.  Social History   Tobacco Use  . Smoking status: Current Some Day Smoker    Types: Cigarettes, Cigars  . Smokeless tobacco: Never Used  Substance Use Topics  . Alcohol use: Yes    Alcohol/week: 1.0 standard drinks    Types: 1 Glasses of wine per week    Comment: social   . Drug use: No    Home Medications Prior to Admission medications   Medication Sig Start Date End Date Taking? Authorizing Provider  ARIPiprazole ER (ABILIFY MAINTENA) 400 MG SRER injection Inject 2 mLs (400 mg total) into the muscle every 28 (twenty-eight) days. Due 5/3 11/19/19   Malvin Johns, MD    benztropine (COGENTIN) 1 MG tablet Take 1 tablet (1 mg total) by mouth 2 (two) times daily. 11/19/19   Malvin Johns, MD  cetirizine (ZYRTEC) 10 MG tablet Take 1 tablet (10 mg total) by mouth daily. Patient not taking: Reported on 11/11/2019 10/07/18   Storm Frisk, MD  doxycycline (VIBRAMYCIN) 100 MG capsule Take 1 capsule (100 mg total) by mouth 2 (two) times daily. 12/01/19   Storm Frisk, MD  fluticasone (FLONASE) 50 MCG/ACT nasal spray Place 2 sprays into both nostrils daily. Patient not taking: Reported on 11/11/2019 10/07/18   Storm Frisk, MD  risperidone (RISPERDAL) 4 MG tablet 2 at hs x 2 weeks then 1 q hs Patient taking differently: Take 2 mg by mouth 2 (two) times daily.  11/19/19   Malvin Johns, MD  temazepam (RESTORIL) 30 MG capsule Take 1 capsule (30 mg total) by mouth at bedtime. 11/19/19   Malvin Johns, MD    Allergies    Oxycodone, Latex, and Risperdal [risperidone]  Review of Systems   Review of Systems  Musculoskeletal: Positive for joint swelling.  Skin: Positive for wound.  All other systems reviewed and are negative.   Physical Exam Updated Vital Signs BP (!) 131/105 (BP Location: Right Arm)   Pulse 98   Temp 98.5 F (36.9 C) (Oral)   Resp 16   SpO2 99%   Physical Exam Vitals and nursing  note reviewed.  Constitutional:      Appearance: She is not ill-appearing.  HENT:     Head: Normocephalic.     Mouth/Throat:     Mouth: Mucous membranes are moist.  Eyes:     Conjunctiva/sclera: Conjunctivae normal.  Cardiovascular:     Rate and Rhythm: Normal rate.  Pulmonary:     Effort: Pulmonary effort is normal.  Musculoskeletal:        General: Tenderness and signs of injury present.  Skin:    General: Skin is warm and dry.  Neurological:     Mental Status: She is alert and oriented to person, place, and time.  Psychiatric:        Mood and Affect: Mood normal.        Behavior: Behavior normal.       ED Results / Procedures / Treatments    Labs (all labs ordered are listed, but only abnormal results are displayed) Labs Reviewed - No data to display  EKG None  Radiology DG Finger Ring Right  Result Date: 12/29/2019 CLINICAL DATA:  Pain and swelling of finger after laceration 3 weeks ago. EXAM: RIGHT RING FINGER 2+V COMPARISON:  None. FINDINGS: There is a 2 mm linear foreign body in the soft tissues radial volar aspect of the proximal phalanx. No soft tissue air. Mild generalized soft tissue edema. No fracture, dislocation, or bony destruction. Normal alignment. IMPRESSION: A 2 mm linear foreign body in the soft tissues radial volar aspect of the proximal phalanx with soft tissue edema. No acute osseous abnormality. Electronically Signed   By: Keith Rake M.D.   On: 12/29/2019 17:48    Procedures Procedures (including critical care time)  Ring cutter used to remove ring from right ring finger. Area under the ring swollen, with open linear wound of 0.3 cm noted on the dorsum of the proximal phalanx.. Good flexion and extension of all phalangeal joints without strength deficit against resistance.  Medications Ordered in ED Medications  Tdap (BOOSTRIX) injection 0.5 mL (0.5 mLs Intramuscular Given 12/29/19 1939)    ED Course  I have reviewed the triage vital signs and the nursing notes.  Pertinent labs & imaging results that were available during my care of the patient were reviewed by me and considered in my medical decision making (see chart for details).    MDM Rules/Calculators/A&P                      Patient X-Ray negative for bony involvement. Primarily soft tissue swelling with small linear wound on distal aspect of the dorsal proximal phalanx. No purulent drainage or abscess noted. Wound care provided. Tetanus updated. Conservative therapy recommended and discussed. Patient will be discharged home & is agreeable with above plan. Returns precautions discussed. Pt appears safe for discharge.   Final Clinical  Impression(s) / ED Diagnoses Final diagnoses:  Injury of finger of right hand, initial encounter  Foreign body in skin of finger, initial encounter    Rx / DC Orders ED Discharge Orders    None       Etta Quill, NP 12/29/19 2254    Margette Fast, MD 12/30/19 [phone]

## 2019-12-29 NOTE — ED Triage Notes (Signed)
Pt here with c/o a wound to her ring finger on her right hand , ring will need to be cut off

## 2019-12-29 NOTE — Discharge Instructions (Addendum)
Please refer to attached instructions. After cleaning, apply  a small amount of neosporin to the affected area and cover with a band-aid. Monitor for signs of infection as discussed.

## 2020-01-12 ENCOUNTER — Other Ambulatory Visit: Payer: Self-pay | Admitting: Critical Care Medicine

## 2020-01-12 ENCOUNTER — Encounter: Payer: Self-pay | Admitting: Critical Care Medicine

## 2020-01-12 MED ORDER — DOXYCYCLINE HYCLATE 100 MG PO TABS: 100.0000 mg | ORAL_TABLET | Freq: Two times a day (BID) | ORAL | 0 refills | Status: DC

## 2020-01-12 MED FILL — DOXYCYCLINE HYCLATE 100 MG: 100 | 7 days supply | Qty: 14 | Fill #0

## 2020-01-12 NOTE — Progress Notes (Signed)
Patient ID: Patricia [name], female   DOB: June 16, 1971, 49 y.o.   MRN: [id number] This is a 49 year old female who has not been seen since April and failed her outpatient visit.  She complains of increasing sinus congestion, sweats cough productive yellow mucus.  She is off all of her behavioral health medications.  She was hospitalized in May for a suicidal attempt.  She was prescribed several behavioral health meds none of which she is continued.  She notes cough sneezing and headaches.  She denies fever.  On exam blood pressure 146/82 pulse is 86 saturation 100% room air  Chest showed to be clear cardiac exam showed a regular rate rhythm abdomen soft nontender neurologic intact psychiatric patient has focused affect  Impression is that of acute tracheobronchitis and sinusitis plan is with patient received doxycycline 100 mg twice daily for 7 days I have asked the patient return to the clinic for further follow-up so we can discuss further her mental health needs

## 2020-01-13 NOTE — Congregational Nurse Program (Signed)
Client seen at [facility] clinic for c/o productive cough,and wheezing. Remains afebrile. Rx sent to [facility], CN will pick up deliver.

## 2020-01-14 ENCOUNTER — Telehealth: Payer: Self-pay | Admitting: *Deleted

## 2020-01-14 NOTE — Telephone Encounter (Signed)
Called pt and LVM to return call and schedule an appt with Dr. Delford Field.

## 2020-02-24 ENCOUNTER — Emergency Department (HOSPITAL_COMMUNITY)
Admission: EM | Admit: 2020-02-24 | Discharge: 2020-02-24 | Discharge status: Against Medical Advice | Payer: Self-pay | Attending: Emergency Medicine | Admitting: Emergency Medicine

## 2020-02-24 ENCOUNTER — Encounter (HOSPITAL_COMMUNITY): Payer: Self-pay | Admitting: Emergency Medicine

## 2020-02-24 ENCOUNTER — Other Ambulatory Visit: Payer: Self-pay

## 2020-02-24 ENCOUNTER — Ambulatory Visit (HOSPITAL_COMMUNITY)
Admission: EM | Admit: 2020-02-24 | Discharge: 2020-02-24 | Disposition: A | Discharge status: Home / Self Care | Payer: Self-pay

## 2020-02-24 DIAGNOSIS — N898 Other specified noninflammatory disorders of vagina: Secondary | ICD-10-CM

## 2020-02-24 DIAGNOSIS — N939 Abnormal uterine and vaginal bleeding, unspecified: Secondary | ICD-10-CM

## 2020-02-24 DIAGNOSIS — R42 Dizziness and giddiness: Secondary | ICD-10-CM

## 2020-02-24 DIAGNOSIS — F17290 Nicotine dependence, other tobacco product, uncomplicated: Secondary | ICD-10-CM | POA: Insufficient documentation

## 2020-02-24 LAB — URINALYSIS, ROUTINE W REFLEX MICROSCOPIC
Bilirubin Urine: NEGATIVE
Glucose, UA: NEGATIVE mg/dL
Ketones, ur: NEGATIVE mg/dL
Leukocytes,Ua: NEGATIVE
Nitrite: NEGATIVE
Protein, ur: NEGATIVE mg/dL
RBC / HPF: >50 RBC/hpf — ABNORMAL HIGH (ref 0–5)
Specific Gravity, Urine: 1.023 (ref 1.005–1.030)
pH: 6 (ref 5.0–8.0)

## 2020-02-24 LAB — COMPREHENSIVE METABOLIC PANEL
ALT: 15 U/L (ref 0–44)
AST: 19 U/L (ref 15–41)
Albumin: 3.4 g/dL — ABNORMAL LOW (ref 3.5–5.0)
Alkaline Phosphatase: 74 U/L (ref 38–126)
Anion gap: 8 (ref 5–15)
BUN: 10 mg/dL (ref 6–20)
CO2: 25 mmol/L (ref 22–32)
Calcium: 9.1 mg/dL (ref 8.9–10.3)
Chloride: 107 mmol/L (ref 98–111)
Creatinine, Ser: 0.67 mg/dL (ref 0.44–1.00)
GFR calc Af Amer: >60 mL/min (ref >60)
GFR calc non Af Amer: >60 mL/min (ref >60)
Glucose, Bld: 109 mg/dL — ABNORMAL HIGH (ref 70–99)
Potassium: 3.8 mmol/L (ref 3.5–5.1)
Sodium: 140 mmol/L (ref 135–145)
Total Bilirubin: 0.6 mg/dL (ref 0.3–1.2)
Total Protein: 6.9 g/dL (ref 6.5–8.1)

## 2020-02-24 LAB — CBC
HCT: 37.2 % (ref 36.0–46.0)
Hemoglobin: 11.8 g/dL — ABNORMAL LOW (ref 12.0–15.0)
MCH: 29.4 pg (ref 26.0–34.0)
MCHC: 31.7 g/dL (ref 30.0–36.0)
MCV: 92.8 fL (ref 80.0–100.0)
Platelets: 246 10*3/uL (ref 150–400)
RBC: 4.01 MIL/uL (ref 3.87–5.11)
RDW: 13.6 % (ref 11.5–15.5)
WBC: 4 10*3/uL (ref 4.0–10.5)
nRBC: 0 % (ref 0.0–0.2)

## 2020-02-24 LAB — I-STAT BETA HCG BLOOD, ED (MC, WL, AP ONLY): I-stat hCG, quantitative: <5 m[IU]/mL (ref <5)

## 2020-02-24 LAB — LIPASE, BLOOD: Lipase: 22 U/L (ref 11–51)

## 2020-02-24 MED ORDER — SODIUM CHLORIDE 0.9% FLUSH: 3.0000 mL | Freq: Once | INTRAVENOUS | Status: DC

## 2020-02-24 NOTE — ED Notes (Addendum)
Pt left AMA without alerting nursing staff. Unable to obtain VS before pt left

## 2020-02-24 NOTE — ED Provider Notes (Cosign Needed)
49 year old female presents with multiple complaints. She states she has felt intermittent dizziness at times over the past couple months and has felt lightheaded. She also reports abdominal cramping intermittently for the past couple days along with feeling flushed. When I ask her in more detail what her dizziness vs lightheadedness is likely she becomes flustered and is asking for a MD to see her stating that "these are common medical terms" and it disturbs her that I don't know what this means. Pt is alert, somewhat anxious, and in NAD. Will have MD evaluate  MSE was initiated and I personally evaluated the patient and placed orders (if any) at  12:24 PM on February 24, 2020.  The patient appears stable so that the remainder of the MSE may be completed by another provider.   Bethel Born, PA-C 02/24/20 1226

## 2020-02-24 NOTE — ED Notes (Signed)
Pt resting comfortably in bed. Pt states she "passed out" twice while in bed, "came back lucid". Unable to elaborate when questioned further. Pt states she wants a physician to come see her.

## 2020-02-24 NOTE — ED Triage Notes (Signed)
Pt states she has been lightheaded and dizzy for months.  Reports abd cramping and vaginal itching x 3-4 days.  Denies pain at present.

## 2020-02-24 NOTE — ED Provider Notes (Signed)
[facility]-EM[facility]ENCY DEPT [facility] Emergency Department Provider Note MRN:  [id number]  Arrival date & time: 02/24/20     Chief Complaint   Abdominal Pain and Dizziness   History of Present Illness   Patricia [name] is a 49 y.o. year-old female with a history of schizoaffective disorder presenting to the ED with chief complaint of dizziness.  2 to 3 months of dizziness, a bit worse over the past 3 to 4 days.  Patient is also explaining that her menstrual cycle is normally very regular but during the cycle she has had some spotting followed by heavy bleeding, then her bleeding stopped, and today she noticed return of bleeding.  Endorses occasional headaches that are mild, none recently.  Denies visual change, no neck pain, no chest pain or shortness of breath, mild intermittent abdominal pain, describes a lower cramping pain during menstrual cycle.  No burning with urination, no numbness or weakness to the arms or legs.  Review of Systems  A complete 10 system review of systems was obtained and all systems are negative except as noted in the HPI and PMH.   Patient's Health History    Past Medical History:  Diagnosis Date  . Depression     History reviewed. No pertinent surgical history.  No family history on file.  Social History   Socioeconomic History  . Marital status: Divorced    Spouse name: Not on file  . Number of children: Not on file  . Years of education: Not on file  . Highest education level: Not on file  Occupational History  . Not on file  Tobacco Use  . Smoking status: Current Some Day Smoker    Types: Cigarettes, Cigars  . Smokeless tobacco: Never Used  Substance and Sexual Activity  . Alcohol use: Yes    Alcohol/week: 1.0 standard drink    Types: 1 Glasses of wine per week    Comment: social   . Drug use: No  . Sexual activity: Not on file  Other Topics Concern  . Not on file  Social History Narrative  . Not on file   Social Determinants  of Health   Financial Resource Strain:   . Difficulty of Paying Living Expenses:   Food Insecurity:   . Worried About Programme researcher, broadcasting/film/video in the Last Year:   . Barista in the Last Year:   Transportation Needs:   . Freight forwarder (Medical):   Marland Kitchen Lack of Transportation (Non-Medical):   Physical Activity:   . Days of Exercise per Week:   . Minutes of Exercise per Session:   Stress:   . Feeling of Stress :   Social Connections:   . Frequency of Communication with Friends and Family:   . Frequency of Social Gatherings with Friends and Family:   . Attends Religious Services:   . Active Member of Clubs or Organizations:   . Attends Banker Meetings:   Marland Kitchen Marital Status:   Intimate Partner Violence:   . Fear of Current or Ex-Partner:   . Emotionally Abused:   Marland Kitchen Physically Abused:   . Sexually Abused:      Physical Exam   Vitals:   02/24/20 0904 02/24/20 1226  BP: 119/80 (!) 142/86  Pulse: 76   Resp: 14   Temp: 98.3 F (36.8 C)   SpO2: 95%     CONSTITUTIONAL: Well-appearing, NAD NEURO:  Alert and oriented x 3, no focal deficits EYES:  eyes equal and  reactive ENT/NECK:  no LAD, no JVD CARDIO: Regular rate, well-perfused, normal S1 and S2 PULM:  CTAB no wheezing or rhonchi GI/GU:  normal bowel sounds, non-distended, non-tender MSK/SPINE:  No gross deformities, no edema SKIN:  no rash, atraumatic PSYCH: Labile emotion  *Additional and/or pertinent findings included in MDM below  Diagnostic and Interventional Summary    EKG Interpretation  Date/Time:  Thursday February 24 2020 09:27:14 EDT Ventricular Rate:  76 PR Interval:  126 QRS Duration: 76 QT Interval:  376 QTC Calculation: 423 R Axis:   45 Text Interpretation: Normal sinus rhythm with sinus arrhythmia Poor baseline leads II, III Confirmed by Alvester Chou [phone]) on 02/24/2020 11:07:17 AM      Labs Reviewed  COMPREHENSIVE METABOLIC PANEL - Abnormal; Notable for the following  components:      Result Value   Glucose, Bld 109 (*)    Albumin 3.4 (*)    All other components within normal limits  CBC - Abnormal; Notable for the following components:   Hemoglobin 11.8 (*)    All other components within normal limits  URINALYSIS, ROUTINE W REFLEX MICROSCOPIC - Abnormal; Notable for the following components:   APPearance HAZY (*)    Hgb urine dipstick LARGE (*)    RBC / HPF >50 (*)    Bacteria, UA RARE (*)    All other components within normal limits  WET PREP, GENITAL  LIPASE, BLOOD  I-STAT BETA HCG BLOOD, ED ([facility], [facility], [facility] ONLY)  GC/CHLAMYDIA PROBE AMP ([place]) NOT AT [facility]    No orders to display    Medications  sodium chloride flush (NS) 0.9 % injection 3 mL (has no administration in time range)     Procedures  /  Critical Care Procedures  ED Course and Medical Decision Making  I have reviewed the triage vital signs, the nursing notes, and pertinent available records from the EMR.  Listed above are laboratory and imaging tests that I personally ordered, reviewed, and interpreted and then considered in my medical decision making (see below for details).      Patient is here with a number of complaints.  Given the chronic dizziness with occasional headaches recently, I offered CT head for further evaluation.  Patient declined this.  Patient most concerned about possible venereal disease given her bleeding and vaginal discomfort.  I advised pelvic exam and patient agreed to this.  Explained to her that the remainder of her laboratory evaluation was reassuring.  I began to discuss the possibility that her menstrual cycle may be changing given her age of 39, and she then became verbally combative, refusing to accept this as a possibility, claiming that changes to the menstrual cycle do not happen until the age of 6.  I apologized for unintentionally offending her.  She continued to escalate is now leaving the hospital without completed evaluation.  Overall I  feel that underlying psychiatric issues are driving her labile emotions and it is unfortunate that she is unwilling to stay to receive full evaluation but I do not feel she is a direct threat of harm to self or others.  Elmer Sow. Pilar Plate, MD [facility] [email or web address]  Final Clinical Impressions(s) / ED Diagnoses     ICD-10-CM   1. Dizziness  R42   2. Abnormal uterine bleeding  N93.9   3. Vaginal irritation  N89.8     ED Discharge Orders    None       Discharge Instructions  Discussed with and Provided to Patient:   Discharge Instructions   None       Sabas Sous, MD 02/24/20 1334

## 2020-03-08 ENCOUNTER — Encounter: Payer: Self-pay | Admitting: Critical Care Medicine

## 2020-03-08 ENCOUNTER — Other Ambulatory Visit: Payer: Self-pay | Admitting: Critical Care Medicine

## 2020-03-08 DIAGNOSIS — N921 Excessive and frequent menstruation with irregular cycle: Secondary | ICD-10-CM

## 2020-03-08 DIAGNOSIS — N926 Irregular menstruation, unspecified: Secondary | ICD-10-CM

## 2020-03-08 MED ORDER — DOXYCYCLINE HYCLATE 100 MG PO TABS: 100.0000 mg | ORAL_TABLET | Freq: Two times a day (BID) | ORAL | 0 refills | Status: DC

## 2020-03-08 MED FILL — DOXYCYCLINE HYCLATE 100 MG: 100 | 7 days supply | Qty: 14 | Fill #0

## 2020-03-08 NOTE — Progress Notes (Signed)
Refill doxy

## 2020-03-10 NOTE — Progress Notes (Signed)
Patient ID: Patricia [name], female   DOB: [birth year], 49 y.o.   MRN: [id number] This patient was seen in the [facility] and complains of being flushed lightheaded exhausted notes increased cough minimally productive of gray-yellow mucus.  She still smoking occasional cigarettes is not drinking alcohol.  She was in the ER on the 15th found to have blood in the urine this was observed.  When I gave her doxycycline a month ago for bronchitis this improved her status.  She denies any history currently of suicidal ideation note she is had previous admissions for suicidal ideation.  This patient also has irregular menstrual cycles and wishes to be seen by gynecology.  On exam temp 99 8 saturation 98% room air pulse 94 blood pressure is 120/70  On exam chest showed a few rhonchi left lower lobe expired wheezes cardiac exam showed regular rate rhythm abdomen soft nontender remainder of exam is unremarkable patient is well-hydrated   Pression the patient may have acute bronchitis I do not think she has active pneumonia  Plan is received another 7-day course of doxycycline 100 mg twice daily also will refer her to the outpatient self-pay gynecology clinic and get her an appointment at the [facility] for follow-up

## 2020-03-14 ENCOUNTER — Telehealth: Payer: Self-pay | Admitting: General Practice

## 2020-03-14 NOTE — Telephone Encounter (Signed)
Tried to contact patient to schedule an appointment with Dr. Wright and patient did not answer and no voice mail was set up.  

## 2020-03-28 NOTE — Telephone Encounter (Signed)
Tried to contact patient to schedule an appointment with Dr. Delford Field and patient did not answer and no voice mail was set up.

## 2020-04-05 ENCOUNTER — Other Ambulatory Visit: Payer: Self-pay | Admitting: Critical Care Medicine

## 2020-04-05 ENCOUNTER — Encounter: Payer: Self-pay | Admitting: Critical Care Medicine

## 2020-04-06 NOTE — Progress Notes (Signed)
Patient ID: Patricia [name], female   DOB: [birth year], 49 y.o.   MRN: [id number] This is a 49 year old female seen in the [facility] she does complain of sweating a lot she will have soaking sweats at times she has mild lightheadedness.  I do observe this patient to be outdoors a good part of the day and we are in a current significant heat wave  Patient has a history of schizophrenia schizoaffective disorder bipolar type major depressive disorder as well  Note she has untreated for her mental health disorder she does see Phillips Odor at the [facility] as her primary care physician and she currently does not have any insurance  Impression is that of excess sweating which I believe is due to heat exposure and untreated schizophrenia  I recommended this patient return to [facility] for further follow-up and I will message the [facility] clinic in this regard  She is encouraged to stay out of the sun more and drink excess fluids

## 2020-05-15 ENCOUNTER — Encounter: Payer: Self-pay | Admitting: Obstetrics & Gynecology

## 2020-07-26 ENCOUNTER — Encounter: Payer: Self-pay | Admitting: Critical Care Medicine

## 2020-07-26 NOTE — Progress Notes (Signed)
Patient ID: Patricia [name], female   DOB: 1970-12-20, 49 y.o.   MRN: [id number] This is a 49 year old female seen in the [place] she needs a note that she can stay out of the cold as she is just seen her primary care provider Phillips Odor given a prescription for azithromycin and prednisone for an upper respiratory tract infection  On exam temperature is 101 for blood pressure 108/80 saturation 96% room air pulse 98 chest actually was clear oropharynx showed mild erythema remainder portions exam unremarkable impression is that of tracheobronchitis with active smoking no pneumonia febrile no Covid  Plan for this patient she will continue to take her current medications per her primary care provider and we did provide a note asking the shelter to allow her to stay indoors when the temperature falls below 60 degrees until she improves her respiratory status

## 2020-08-28 ENCOUNTER — Other Ambulatory Visit: Payer: Self-pay

## 2020-08-28 ENCOUNTER — Emergency Department (HOSPITAL_COMMUNITY)
Admission: EM | Admit: 2020-08-28 | Discharge: 2020-08-29 | Disposition: A | Discharge status: Home / Self Care | Payer: Self-pay | Attending: Emergency Medicine | Admitting: Emergency Medicine

## 2020-08-28 DIAGNOSIS — R350 Frequency of micturition: Secondary | ICD-10-CM | POA: Diagnosis not present

## 2020-08-28 DIAGNOSIS — Z91040 Latex allergy status: Secondary | ICD-10-CM | POA: Insufficient documentation

## 2020-08-28 DIAGNOSIS — F17290 Nicotine dependence, other tobacco product, uncomplicated: Secondary | ICD-10-CM | POA: Insufficient documentation

## 2020-08-28 DIAGNOSIS — N939 Abnormal uterine and vaginal bleeding, unspecified: Secondary | ICD-10-CM | POA: Diagnosis present

## 2020-08-28 DIAGNOSIS — F17210 Nicotine dependence, cigarettes, uncomplicated: Secondary | ICD-10-CM | POA: Insufficient documentation

## 2020-08-28 DIAGNOSIS — R059 Cough, unspecified: Secondary | ICD-10-CM | POA: Insufficient documentation

## 2020-08-28 DIAGNOSIS — M7918 Myalgia, other site: Secondary | ICD-10-CM | POA: Diagnosis not present

## 2020-08-28 DIAGNOSIS — Z0000 Encounter for general adult medical examination without abnormal findings: Secondary | ICD-10-CM

## 2020-08-28 LAB — URINALYSIS, ROUTINE W REFLEX MICROSCOPIC
Bacteria, UA: NONE SEEN
Bilirubin Urine: NEGATIVE
Glucose, UA: NEGATIVE mg/dL
Ketones, ur: 20 mg/dL — AB
Leukocytes,Ua: NEGATIVE
Nitrite: NEGATIVE
Protein, ur: 30 mg/dL — AB
RBC / HPF: >50 RBC/hpf — ABNORMAL HIGH (ref 0–5)
Specific Gravity, Urine: 1.023 (ref 1.005–1.030)
pH: 6 (ref 5.0–8.0)

## 2020-08-28 LAB — POC URINE PREG, ED: Preg Test, Ur: NEGATIVE

## 2020-08-28 NOTE — ED Triage Notes (Signed)
Pt reports that she has been having urinary incontinence since last night and vaginal bleeding since last night and thinks she may be having a miscarriage, pt is homeless, pt also reports decreased appetite, denies SI/HI

## 2020-08-29 ENCOUNTER — Emergency Department (HOSPITAL_COMMUNITY): Payer: Self-pay

## 2020-08-29 NOTE — ED Notes (Addendum)
Pt refused to get her temperature checked. Pt proceeded to say that she has PNA and called this RN a Sales promotion account executive. CXR result read and explained to patient.

## 2020-08-29 NOTE — ED Provider Notes (Signed)
[facility] EMERGENCY DEPARTMENT Provider Note   CSN: [id number] Arrival date & time: 08/28/20  1909     History Chief Complaint  Patient presents with  . Vaginal Bleeding    Patricia [name] is a 50 y.o. female.  HPI   50 year old female with past medical history of depression presents to the emergency department for medical evaluation.  Patient told triage that she been having urinary frequency/incontinence as well as vaginal bleeding.  When I addressed this with the patient she states that this is not the real reason she came.  She is here for decreased appetite, congestion, body aches and nonproductive cough.  Patient states she was tested for COVID and flu last week, she believes the flu was positive.  She denies any active chest pain or shortness of breath.  She states she has had a decreased appetite but denies any vomiting or diarrhea.  Patient denies being sexually active but she does believe that she could actively be pregnant.   Past Medical History:  Diagnosis Date  . Depression     Patient Active Problem List   Diagnosis Date Noted  . Irregular menstrual cycle 03/08/2020  . Menorrhagia with irregular cycle 03/08/2020  . MDD (major depressive disorder), recurrent episode, severe (HCC) 11/12/2019  . Suicidal ideation   . Schizoaffective disorder, bipolar type (HCC)   . Depression 05/21/2018  . Overactive bladder 01/19/2018  . Schizophrenia (HCC) 01/19/2018    No past surgical history on file.   OB History   No obstetric history on file.     No family history on file.  Social History   Tobacco Use  . Smoking status: Current Some Day Smoker    Types: Cigarettes, Cigars  . Smokeless tobacco: Never Used  Substance Use Topics  . Alcohol use: Yes    Alcohol/week: 1.0 standard drink    Types: 1 Glasses of wine per week    Comment: social   . Drug use: No    Home Medications Prior to Admission medications   Not on File     Allergies    Oxycodone, Latex, and Risperdal [risperidone]  Review of Systems   Review of Systems  Constitutional: Positive for appetite change and fatigue. Negative for chills and fever.  HENT: Negative for congestion.   Eyes: Negative for visual disturbance.  Respiratory: Positive for cough. Negative for shortness of breath.   Cardiovascular: Negative for chest pain.  Gastrointestinal: Negative for abdominal pain, diarrhea and vomiting.  Genitourinary: Positive for frequency and vaginal bleeding. Negative for dysuria, pelvic pain, vaginal discharge and vaginal pain.  Musculoskeletal: Positive for myalgias.  Skin: Negative for rash.  Neurological: Negative for headaches.    Physical Exam Updated Vital Signs BP (!) 110/94   Pulse 86   Temp 98.3 F (36.8 C) (Oral)   Resp (!) 25   SpO2 98%   Physical Exam Vitals and nursing note reviewed. Exam conducted with a chaperone present.  Constitutional:      Appearance: Normal appearance.  HENT:     Head: Normocephalic.     Mouth/Throat:     Mouth: Mucous membranes are moist.  Cardiovascular:     Rate and Rhythm: Normal rate.  Pulmonary:     Effort: Pulmonary effort is normal. No respiratory distress.  Abdominal:     Palpations: Abdomen is soft.     Tenderness: There is no abdominal tenderness.  Genitourinary:    Comments: declined Skin:    General: Skin is warm.  Neurological:     Mental Status: She is alert and oriented to person, place, and time. Mental status is at baseline.  Psychiatric:        Mood and Affect: Mood normal.     ED Results / Procedures / Treatments   Labs (all labs ordered are listed, but only abnormal results are displayed) Labs Reviewed  URINALYSIS, ROUTINE W REFLEX MICROSCOPIC - Abnormal; Notable for the following components:      Result Value   APPearance HAZY (*)    Hgb urine dipstick LARGE (*)    Ketones, ur 20 (*)    Protein, ur 30 (*)    RBC / HPF >50 (*)    All other components  within normal limits  POC URINE PREG, ED    EKG None  Radiology No results found.  Procedures Procedures (including critical care time)  Medications Ordered in ED Medications - No data to display  ED Course  I have reviewed the triage vital signs and the nursing notes.  Pertinent labs & imaging results that were available during my care of the patient were reviewed by me and considered in my medical decision making (see chart for details).    MDM Rules/Calculators/A&P                          50 year old female presents to the emergency department for medical evaluation.  Triage notes state that her complaint was urinary incontinence and vaginal bleeding.  Patient tells me that is not the real reason she was here.  She states she is here for fatigue, appetite change, congestion and dry cough.  Vital signs are normal, she has normal heart rate and oxygenation on room air, she is conversational and very comfortable appearing.  Pregnancy test is negative.  Patient is aware of this.  Urinalysis shows no infection but does show blood, potential signs of vaginal bleeding.  When I addressed this with the patient she states she does not want to be evaluated for that, she declines pelvic and further exam in regards to the vaginal bleeding.  She wants to be evaluated for her other symptoms.  Chest x-ray has been ordered in regards to the cough.  I have a low suspicion for significant vaginal bleeding resulting in anemia given normal vital signs.  Patient denies any chest pain or shortness of breath.  She is asking to eat and drink.  We will p.o. challenge her and do a chest x-ray.  With normal vitals and no acute complaint I do not see the need for emergent lab evaluation at this time.  Patient is noted to be homeless, denies any SI/HI. If CXR normal, plan for DC.  1039 chest x-ray is clear.  We have monitored the patient while in the department, when no one is in the room her heart rate and  oxygenation at rest is completely normal.She is laying in bed, watching TV.  I went in to relay the normal chest x-ray findings to the patient and offer a COVID/flu swab with discharge.  Patient has had 2 bagged meals since being here.  She states she is still hungry, I offered her more food and mentioned discharge.  At that time patient became very agitated, heart rate elevated to 130, patient appears to not want to leave the department.  When no one is in the room her vitals are normal, when she gets agitated her heart rate and blood pressure elevates.  I  believe this is related to her agitation and not genuine medical disease.  She is still speaking on room air without any difficulty or displaying any signs of acute illness.  Plan for discharge.   Final Clinical Impression(s) / ED Diagnoses Final diagnoses:  None    Rx / DC Orders ED Discharge Orders    None       Rozelle Logan, DO 08/29/20 1109

## 2020-08-29 NOTE — Discharge Instructions (Signed)
You have been seen and discharged from the emergency department.  Follow-up with your primary provider for reevaluation. If you have any worsening symptoms or further concerns for health please return to an emergency department for further evaluation.

## 2020-10-04 ENCOUNTER — Emergency Department (HOSPITAL_COMMUNITY)
Admission: EM | Admit: 2020-10-04 | Discharge: 2020-10-07 | Disposition: A | Discharge status: Other Acute Inpatient Hospital | Payer: Self-pay | Attending: Emergency Medicine | Admitting: Emergency Medicine

## 2020-10-04 ENCOUNTER — Other Ambulatory Visit: Payer: Self-pay

## 2020-10-04 ENCOUNTER — Encounter (HOSPITAL_COMMUNITY): Payer: Self-pay

## 2020-10-04 DIAGNOSIS — Z91040 Latex allergy status: Secondary | ICD-10-CM | POA: Insufficient documentation

## 2020-10-04 DIAGNOSIS — F250 Schizoaffective disorder, bipolar type: Secondary | ICD-10-CM | POA: Diagnosis present

## 2020-10-04 DIAGNOSIS — R441 Visual hallucinations: Secondary | ICD-10-CM | POA: Insufficient documentation

## 2020-10-04 DIAGNOSIS — F16959 Hallucinogen use, unspecified with hallucinogen-induced psychotic disorder, unspecified: Secondary | ICD-10-CM | POA: Insufficient documentation

## 2020-10-04 DIAGNOSIS — Z20822 Contact with and (suspected) exposure to covid-19: Secondary | ICD-10-CM | POA: Insufficient documentation

## 2020-10-04 DIAGNOSIS — R55 Syncope and collapse: Secondary | ICD-10-CM | POA: Insufficient documentation

## 2020-10-04 DIAGNOSIS — F17210 Nicotine dependence, cigarettes, uncomplicated: Secondary | ICD-10-CM | POA: Insufficient documentation

## 2020-10-04 LAB — CBC
HCT: 31.8 % — ABNORMAL LOW (ref 36.0–46.0)
Hemoglobin: 10.5 g/dL — ABNORMAL LOW (ref 12.0–15.0)
MCH: 30.4 pg (ref 26.0–34.0)
MCHC: 33 g/dL (ref 30.0–36.0)
MCV: 92.2 fL (ref 80.0–100.0)
Platelets: 293 10*3/uL (ref 150–400)
RBC: 3.45 MIL/uL — ABNORMAL LOW (ref 3.87–5.11)
RDW: 14.1 % (ref 11.5–15.5)
WBC: 3.8 10*3/uL — ABNORMAL LOW (ref 4.0–10.5)
nRBC: 0 % (ref 0.0–0.2)

## 2020-10-04 LAB — URINALYSIS, ROUTINE W REFLEX MICROSCOPIC
Bilirubin Urine: NEGATIVE
Glucose, UA: NEGATIVE mg/dL
Ketones, ur: NEGATIVE mg/dL
Leukocytes,Ua: NEGATIVE
Nitrite: NEGATIVE
Protein, ur: NEGATIVE mg/dL
Specific Gravity, Urine: 1.026 (ref 1.005–1.030)
pH: 5 (ref 5.0–8.0)

## 2020-10-04 LAB — BASIC METABOLIC PANEL
Anion gap: 11 (ref 5–15)
BUN: 14 mg/dL (ref 6–20)
CO2: 24 mmol/L (ref 22–32)
Calcium: 9.3 mg/dL (ref 8.9–10.3)
Chloride: 105 mmol/L (ref 98–111)
Creatinine, Ser: 0.64 mg/dL (ref 0.44–1.00)
GFR, Estimated: >60 mL/min (ref >60)
Glucose, Bld: 112 mg/dL — ABNORMAL HIGH (ref 70–99)
Potassium: 3.6 mmol/L (ref 3.5–5.1)
Sodium: 140 mmol/L (ref 135–145)

## 2020-10-04 LAB — ACETAMINOPHEN LEVEL: Acetaminophen (Tylenol), Serum: <10 ug/mL — ABNORMAL LOW (ref 10–30)

## 2020-10-04 LAB — RAPID URINE DRUG SCREEN, HOSP PERFORMED
Amphetamines: NOT DETECTED
Barbiturates: NOT DETECTED
Benzodiazepines: NOT DETECTED
Cocaine: NOT DETECTED
Opiates: NOT DETECTED
Tetrahydrocannabinol: NOT DETECTED

## 2020-10-04 LAB — I-STAT BETA HCG BLOOD, ED (MC, WL, AP ONLY): I-stat hCG, quantitative: <5 m[IU]/mL (ref <5)

## 2020-10-04 LAB — CBG MONITORING, ED: Glucose-Capillary: 95 mg/dL (ref 70–99)

## 2020-10-04 LAB — SALICYLATE LEVEL: Salicylate Lvl: <7 mg/dL — ABNORMAL LOW (ref 7.0–30.0)

## 2020-10-04 LAB — ETHANOL: Alcohol, Ethyl (B): <10 mg/dL (ref <10)

## 2020-10-04 MED ORDER — ONDANSETRON HCL 4 MG PO TABS: 4.0000 mg | ORAL_TABLET | Freq: Three times a day (TID) | ORAL | Status: DC | PRN

## 2020-10-04 MED ORDER — ACETAMINOPHEN 325 MG PO TABS
650.0000 mg | ORAL_TABLET | ORAL | Status: DC | PRN
  Administered 2020-10-05: 650 mg via ORAL
  Filled 2020-10-04: qty 2

## 2020-10-04 NOTE — ED Notes (Signed)
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 Reports she has been having sob and coughing for the last 2 years. Reports diarrhea.

## 2020-10-04 NOTE — ED Provider Notes (Signed)
[facility] EMERGENCY DEPARTMENT Provider Note   CSN: [id number] Arrival date & time: 10/04/20  1847     History Chief Complaint  Patient presents with  . Hallucinations  . Loss of Consciousness    Patricia [name] is a 50 y.o. female.  The history is provided by the patient and medical records.  Loss of Consciousness   50 y.o. F with hx of depression, schizophrenia, presenting to the ED for various complaints.  States she has been on antibiotics on and off for the past 8 months due to "pneumonia".  She states she feels this caused her to be anorexic or bulimic because she does not want to eat/drink, but occasionally she does eat.  She states now she is seeing people chasing after her and lunging at her.  States she noticed this about 1 week ago in a public bathroom but now they are "everywhere".  She states they do not talk to her and do not command her to do anything.  States she thinks the hallucinations are from her "anorexia or bulimia".  She denies suicidal or homicidal thoughts.  She denies drug or alcohol abuse.  States she only currently takes vitamins (st. John's wort and B12).  She denies cough, fever, chills, sweats.  Past Medical History:  Diagnosis Date  . Depression     Patient Active Problem List   Diagnosis Date Noted  . Irregular menstrual cycle 03/08/2020  . Menorrhagia with irregular cycle 03/08/2020  . MDD (major depressive disorder), recurrent episode, severe (HCC) 11/12/2019  . Suicidal ideation   . Schizoaffective disorder, bipolar type (HCC)   . Depression 05/21/2018  . Overactive bladder 01/19/2018  . Schizophrenia (HCC) 01/19/2018    History reviewed. No pertinent surgical history.   OB History   No obstetric history on file.     No family history on file.  Social History   Tobacco Use  . Smoking status: Current Some Day Smoker    Types: Cigarettes, Cigars  . Smokeless tobacco: Never Used  Substance Use Topics   . Alcohol use: Yes    Alcohol/week: 1.0 standard drink    Types: 1 Glasses of wine per week    Comment: social   . Drug use: No    Home Medications Prior to Admission medications   Not on File    Allergies    Oxycodone, Latex, and Risperdal [risperidone]  Review of Systems   Review of Systems  Cardiovascular: Positive for syncope.  Psychiatric/Behavioral: Positive for hallucinations.  All other systems reviewed and are negative.   Physical Exam Updated Vital Signs BP 120/73 (BP Location: Left Arm)   Pulse 81   Resp 16   SpO2 98%   Physical Exam Vitals and nursing note reviewed.  Constitutional:      Appearance: She is well-developed and well-nourished.  HENT:     Head: Normocephalic and atraumatic.     Mouth/Throat:     Mouth: Oropharynx is clear and moist.  Eyes:     Extraocular Movements: EOM normal.     Conjunctiva/sclera: Conjunctivae normal.     Pupils: Pupils are equal, round, and reactive to light.  Cardiovascular:     Rate and Rhythm: Normal rate and regular rhythm.     Heart sounds: Normal heart sounds.  Pulmonary:     Effort: Pulmonary effort is normal. No respiratory distress.     Breath sounds: Normal breath sounds. No rhonchi.  Abdominal:     General: Bowel sounds are  normal.     Palpations: Abdomen is soft.  Musculoskeletal:        General: Normal range of motion.     Cervical back: Normal range of motion.  Skin:    General: Skin is warm and dry.  Neurological:     Mental Status: She is alert and oriented to person, place, and time.  Psychiatric:        Mood and Affect: Mood and affect normal.     Comments: Odd affect, appears to be staring around the room during exam, responses are somewhat slow and often her eyes roll in back of her head when asked a question (?internal stimuli) Denies SI/HI     ED Results / Procedures / Treatments   Labs (all labs ordered are listed, but only abnormal results are displayed) Labs Reviewed  BASIC  METABOLIC PANEL - Abnormal; Notable for the following components:      Result Value   Glucose, Bld 112 (*)    All other components within normal limits  CBC - Abnormal; Notable for the following components:   WBC 3.8 (*)    RBC 3.45 (*)    Hemoglobin 10.5 (*)    HCT 31.8 (*)    All other components within normal limits  URINALYSIS, ROUTINE W REFLEX MICROSCOPIC - Abnormal; Notable for the following components:   APPearance HAZY (*)    Hgb urine dipstick MODERATE (*)    Bacteria, UA RARE (*)    All other components within normal limits  SALICYLATE LEVEL - Abnormal; Notable for the following components:   Salicylate Lvl <7.0 (*)    All other components within normal limits  ACETAMINOPHEN LEVEL - Abnormal; Notable for the following components:   Acetaminophen (Tylenol), Serum <10 (*)    All other components within normal limits  RESP PANEL BY RT-PCR (FLU A&B, COVID) ARPGX2  ETHANOL  RAPID URINE DRUG SCREEN, HOSP PERFORMED  CBG MONITORING, ED  I-STAT BETA HCG BLOOD, ED ([facility], [facility], AP ONLY)  I-STAT BETA HCG BLOOD, ED ([facility], [facility], [facility] ONLY)    EKG None  Radiology No results found.  Procedures Procedures   Medications Ordered in ED Medications  acetaminophen (TYLENOL) tablet 650 mg (has no administration in time range)  ondansetron (ZOFRAN) tablet 4 mg (has no administration in time range)    ED Course  I have reviewed the triage vital signs and the nursing notes.  Pertinent labs & imaging results that were available during my care of the patient were reviewed by me and considered in my medical decision making (see chart for details).    MDM Rules/Calculators/A&P  50 year old female presenting to the ED with various complaints.  Reports "pneumonia" on and off for 8 months requiring antibiotics.  Based on chart review, she has had recent imaging that did not show any signs of pneumonia and I do not see and recent visits with that diagnosis.  States she feels like she has now  developed anorexia or bulimia from this.  She does admit to eating sometimes.  Now having hallucinations, seeing people chasing her and lunging at her.  Denies any auditory hallucinations or command hallucinations.  She is awake and alert here, does have an odd affect, staring around the room during exam and often when asked for questions her eyes rolled back of her head.  I question if she may be experiencing some internal stimuli.  She denies any drug or alcohol abuse.  Her labs today are reassuring.  Will get TTS  consult.  TTS has evaluated, recommends overnight observation and reassessment in the morning by psychiatry.  Final Clinical Impression(s) / ED Diagnoses Final diagnoses:  Visual hallucinations    Rx / DC Orders ED Discharge Orders    None       Garlon Hatchet, PA-C 10/05/20 0304    Charlynne Pander, MD 10/05/20 [phone]

## 2020-10-04 NOTE — ED Triage Notes (Signed)
Pt has multiple complaints, rambling in triage. Reports that she is anorexic and has not been eating, having dizziness, passing out, hearing and seeing things that are not there, feeling paranoid like people are following her, denies SI/HI.

## 2020-10-05 DIAGNOSIS — F250 Schizoaffective disorder, bipolar type: Secondary | ICD-10-CM

## 2020-10-05 LAB — RESP PANEL BY RT-PCR (FLU A&B, COVID) ARPGX2
Influenza A by PCR: NEGATIVE
Influenza B by PCR: NEGATIVE
SARS Coronavirus 2 by RT PCR: NEGATIVE

## 2020-10-05 MED ORDER — HALOPERIDOL 5 MG PO TABS
10.0000 mg | ORAL_TABLET | Freq: Every day | ORAL | Status: DC
  Filled 2020-10-05 (×2): qty 2

## 2020-10-05 MED ORDER — LORAZEPAM 1 MG PO TABS: 1.0000 mg | ORAL_TABLET | ORAL | Status: DC | PRN

## 2020-10-05 MED ORDER — OLANZAPINE 5 MG PO TBDP
10.0000 mg | ORAL_TABLET | Freq: Three times a day (TID) | ORAL | Status: DC | PRN
  Administered 2020-10-05 – 2020-10-06 (×2): 10 mg via ORAL
  Filled 2020-10-05 (×2): qty 2

## 2020-10-05 MED ORDER — HALOPERIDOL 5 MG PO TABS
5.0000 mg | ORAL_TABLET | Freq: Every day | ORAL | Status: DC
  Administered 2020-10-05 – 2020-10-06 (×2): 5 mg via ORAL
  Filled 2020-10-05 (×3): qty 1

## 2020-10-05 MED ORDER — ZIPRASIDONE MESYLATE 20 MG IM SOLR: 20.0000 mg | INTRAMUSCULAR | Status: DC | PRN

## 2020-10-05 NOTE — ED Provider Notes (Signed)
Emergency Medicine Observation Re-evaluation Note  Patricia [name] is a 50 y.o. female, seen on rounds today.  Pt initially presented to the ED for complaints of Hallucinations and Loss of Consciousness Currently, the patient is resting quietly in bed.  Physical Exam  BP 120/67   Pulse 76   Temp 97.6 F (36.4 C) (Oral)   Resp 16   SpO2 98%  Physical Exam General: No acute distress Cardiac: Well-perfused Lungs: Nonlabored Psych: Currently cooperative  ED Course / MDM  EKG:EKG Interpretation  Date/Time:  Wednesday October 04 2020 22:51:24 EST Ventricular Rate:  79 PR Interval:  118 QRS Duration: 65 QT Interval:  381 QTC Calculation: 437 R Axis:   71 Text Interpretation: Sinus rhythm Biatrial enlargement ST elev, probable normal early repol pattern When compared with ECG of EARLIER SAME DATE No significant change was found Confirmed by Dione Booze ([id number]) on 10/05/2020 4:46:33 AM    I have reviewed the labs performed to date as well as medications administered while in observation.  Recent changes in the last 24 hours include medical clearance and psychiatric evaluation.  Plan  Current plan is for reassessment in the morning. Patient is not under full IVC at this time.  1530-patient reassessed by psychiatry and they feel she meets inpatient criteria.  Bed search.   Terrilee Files, MD 10/05/20 [phone]

## 2020-10-05 NOTE — ED Notes (Signed)
Pt refusing bed time medication states that it is poisonous. Pt also refusing to take the rest of belongings off at this time, states she will do it later. Pt is calm and to prevent agitation, this RN will try again later to get belongings.

## 2020-10-05 NOTE — ED Notes (Signed)
Ongoing TTS evaluation.

## 2020-10-05 NOTE — ED Notes (Signed)
Pt ambulated to bathroom without difficulty.

## 2020-10-05 NOTE — BH Assessment (Signed)
Comprehensive Clinical Assessment (CCA) Note  10/05/2020 Patricia [name] [id number]  Chief Complaint:  Chief Complaint  Patient presents with  . Hallucinations  . 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   Pt reports that she has been feeling paranoid; also, experiencing visual hallucinations. Pt reported that when she becomes frustrated "I banged my head against the wall".  Pt reported decreased in appetite, "I am experiencing anorexia; also I am vomiting".  Pt reports that she is receiving four or five hours of sleep during the night.  Pt denies any recent manic symptoms.  Pt denies previous suicide attempts.  Pt denies any history of intentional self injurious behaviors.  Pt denies homicidal ideation history of violence.  Pt admitted "I see people lunging at me and attacking me".  Pt denies the used of substance and alcohol.  Pt admitted to smoking both cigarettes and cigars daily.  Pt identifies her primary stressors as being attack by people she don't see.  Pt says she have been connecting to the [facility], which is her support system.  Pt denies substance use and mental illness in her family.  Pt denies any history of abuse or trauma.  Pt denies any current legal problems.  Pt says she is not currently receiving weekly outpatient therapy; also is not receiving outpatient medication management.  Pt reports that she visit Patricia [name] at the [name], "she is my primary practitioner".  Pt reports one previous inpatient psychiatric [facility] in [facility].  Pt is dressed in scrubs, alert, oriented x 3 flight of ideals speech and restless motor behavior.  Eye contact is avoiding.  Pt mood is hypomania an affect is  anxious.  Thought process is delusions.  Pt's insight is shallow and judgement is poor.  There is indication Pt is currently responding to internal stimuli or experiencing delusional thought content.               Disposition Patricia Ajibola NP, recommends overnight observation and to be reassessed by psychiatry.  Disposition discussed with Patricia [name], via secure chat in Epic.  CCA Screening, Triage and Referral (STR)  Patient Reported Information How did you hear about us? Family/Friend  Referral name: No data recorded Referral phone number: No data recorded  Whom do you see for routine medical problems? No data recorded Practice/Facility Name: No data recorded Practice/Facility Phone Number: No data recorded Name of Contact: No data recorded Contact Number: No data recorded Contact Fax Number: No data recorded Prescriber Name: No data recorded Prescriber Address (if known): No data recorded  What Is the Reason for Your Visit/Call Today? No data recorded How Long Has This Been Causing You Problems? 1 wk - 1 month  What Do You Feel Would Help You the Most Today? -- (UTA)   Have You Recently Been in Any Inpatient Treatment (Hospital/Detox/Crisis Center/28-Day Program)? No  Name/Location of Program/Hospital:No data recorded How Long Were You There? No data recorded When Were You Discharged? No data recorded  Have You Ever Received Services From [facility] Before? Yes  Who Do You [facility]? 08/28/20   Have You Recently Had Any Thoughts About Hurting Yourself? Yes (Pt reports that she has been banging her head against the wall.)  Are You Planning  to Commit Suicide/Harm Yourself At This time? No   Have you Recently Had Thoughts About Hurting Someone Patricia [name]? No  Explanation: No data recorded  Have You Used Any Alcohol or Drugs in the Past 24 Hours? No  How Long Ago Did You Use Drugs or Alcohol? No data recorded What Did You Use and How Much? No data  recorded  Do You Currently Have a Therapist/Psychiatrist? No  Name of Therapist/Psychiatrist: No data recorded  Have You Been Recently Discharged From Any Office Practice or Programs? No  Explanation of Discharge From Practice/Program: No data recorded    CCA Screening Triage Referral Assessment Type of Contact: Tele-Assessment  Is this Initial or Reassessment? Initial Assessment  Date Telepsych consult ordered in [facility]:  10/05/2020  Time Telepsych consult ordered in [facility]:  No data recorded  Patient Reported Information Reviewed? No data recorded Patient Left Without Being Seen? No data recorded Reason for Not Completing Assessment: No data recorded  Collateral Involvement: Pt reported uncle, Patricia [name], [phone] - unable to contact.   Does Patient Have a Automotive engineer Guardian? No data recorded Name and Contact of Legal Guardian: self  If Minor and Not Living with Parent(s), Who has Custody? n/a  Is CPS involved or ever been involved? Never  Is APS involved or ever been involved? -- (UTA)   Patient Determined To Be At Risk for Harm To Self or Others Based on Review of Patient Reported Information or Presenting Complaint? Yes, for Self-Harm (Pt reports that she is banging her head against the wall.)  Method: No data recorded Availability of Means: No data recorded Intent: No data recorded Notification Required: No data recorded Additional Information for Danger to Others Potential: No data recorded Additional Comments for Danger to Others Potential: No data recorded Are There Guns or Other Weapons in Your Home? No data recorded Types of Guns/Weapons: No data recorded Are These Weapons Safely Secured?                            No data recorded Who Could Verify You Are Able To Have These Secured: No data recorded Do You Have any Outstanding Charges, Pending Court Dates, Parole/Probation? No data recorded Contacted To Inform of Risk of Harm To Self or  Others: Unable to Contact: (Unable to contact uncle, Patricia Nine, [phone])   Location of Assessment: [facility] ED   Does Patient Present under Involuntary Commitment? No  IVC Papers Initial File Date: No data recorded  [place] of Residence: [place]   Patient Currently Receiving the Following Services: Not Receiving Services   Determination of Need: No data recorded  Options For Referral: -- (UTA)     CCA Biopsychosocial Intake/Chief Complaint:  Visual Hallucination  Current Symptoms/Problems: banging her head, paranoia   Patient Reported Schizophrenia/Schizoaffective Diagnosis in Past: Yes   Strengths: UTA  Preferences: UTA  Abilities: UTA   Type of Services Patient Feels are Needed: UTA   Initial Clinical Notes/Concerns: UTA   Mental Health Symptoms Depression:  Increase/decrease in appetite   Duration of Depressive symptoms: No data recorded  Mania:  Racing thoughts; Recklessness; Irritability   Anxiety:   Irritability; Restlessness; Worrying   Psychosis:  Grossly disorganized or catatonic behavior; Hallucinations   Duration of Psychotic symptoms: Less than six months   Trauma:  None   Obsessions:  Poor insight; Recurrent & persistent thoughts/impulses/images   Compulsions:  Repeated behaviors/mental acts; Poor Insight; Disrupts with routine/functioning   Inattention:  Does not follow instructions (not oppositional); Fails to pay attention/makes careless mistakes; Poor follow-through on tasks   Hyperactivity/Impulsivity:  N/A   Oppositional/Defiant Behaviors:  None   Emotional Irregularity:  Potentially harmful impulsivity; Recurrent suicidal behaviors/gestures/threats (Pt reports banging head against the wall)   Other Mood/Personality Symptoms:  paranoia    Mental Status Exam Appearance and self-care  Stature:  Average   Weight:  Average weight   Clothing:  -- (Pt dressed in scrubs.)   Grooming:  Normal   Cosmetic use:   Inappropriate for age   Posture/gait:  -- 94)   Motor activity:  Restless   Sensorium  Attention:  Confused   Concentration:  Focuses on irrelevancies   Orientation:  Object; Person; Place   Recall/memory:  Defective in Short-term   Affect and Mood  Affect:  Constricted   Mood:  Hypomania   Relating  Eye contact:  Staring   Facial expression:  Responsive   Attitude toward examiner:  Cooperative   Thought and Language  Speech flow: Flight of Ideas   Thought content:  Delusions   Preoccupation:  None   Hallucinations:  Visual   Organization:  No data recorded  Company secretary of Knowledge:  Fair   Intelligence:  Below average   Abstraction:  Abstract   Judgement:  Poor   Reality Testing:  Distorted   Insight:  Shallow   Decision Making:  Paralyzed   Social Functioning  Social Maturity:  Irresponsible   Social Judgement:  Victimized   Stress  Stressors:  Illness   Coping Ability:  Deficient supports   Skill Deficits:  Activities of daily living; Intellect/education; Interpersonal; Responsibility; Self-care; Communication; Decision making   Supports:  Support needed     Religion: Religion/Spirituality Are You A Religious Person?:  (UTA) How Might This Affect Treatment?: UTA  Leisure/Recreation: Leisure / Recreation Do You Have Hobbies?:  (UTA)  Exercise/Diet: Exercise/Diet Do You Exercise?: Yes (U) What Type of Exercise Do You Do?: Other (Comment) (walking) How Many Times a Week Do You Exercise?: Daily Have You Gained or Lost A Significant Amount of Weight in the Past Six Months?: Yes-Lost Number of Pounds Lost?:  (Pt reports that she have not been eating "I am dealing with eating disorder".  Pt unable to share how much weight she has lost.) Do You Follow a Special Diet?: No Do You Have Any Trouble Sleeping?: Yes Explanation of Sleeping Difficulties: Pt reports that she sleep 4 or 5 hours a night   CCA  Employment/Education Employment/Work Situation: Employment / Work Situation Employment situation: Unemployed Patient's job has been impacted by current illness: No What is the longest time patient has a held a job?: UTA Where was the patient employed at that time?: UTA Has patient ever been in the [employer]?:  (Pt reports that she was in the [employer])  Education: Education Is Patient Currently Attending School?: No Last Grade Completed: 12 Name of High School: [employer] Did Garment/textile technologist From [employer]?: Yes Did Theme park manager?: No Did Designer, television/film set?: No Did You Have Any Special Interests In School?: UTA Did You Have An Individualized Education Program (IIEP):  (UTA) Did You Have Any Difficulty At School?:  (UTA) Patient's Education Has Been Impacted by Current Illness:  (UTA)   CCA Family/Childhood History Family and Relationship History: Family history Are you sexually active?:  (UTA) What is your sexual orientation?: UTA Has your sexual activity been affected by drugs, alcohol, medication, or emotional  stress?: UTA Does patient have children?: Yes How many children?: 11 How is patient's relationship with their children?: UTA  Childhood History:  Childhood History By whom was/is the patient raised?:  (UTA) Additional childhood history information: Pt reports that she born in [place] Description of patient's relationship with caregiver when they were a child: UTA Patient's description of current relationship with people who raised him/her: UTA How were you disciplined when you got in trouble as a child/adolescent?: UTA Does patient have siblings?:  (UTA) Did patient suffer any verbal/emotional/physical/sexual abuse as a child?: No Has patient ever been sexually abused/assaulted/raped as an adolescent or adult?: No Was the patient ever a victim of a crime or a disaster?: No Witnessed domestic violence?: No Has patient been affected  by domestic violence as an adult?: No  Child/Adolescent Assessment:     CCA Substance Use Alcohol/Drug Use: Alcohol / Drug Use Pain Medications: See MRA Prescriptions: See MRA Over the Counter: See MRA History of alcohol / drug use?: Yes Substance #1 Name of Substance 1: Cigerettes/Cigars 1 - Age of First Use: UTA 1 - Amount (size/oz): UTA 1 - Frequency: daily 1 - Duration: ongoing 1 - Last Use / Amount: 10/05/20 1 - Method of Aquiring: UTA 1- Route of Use: smoking                       ASAM's:  Six Dimensions of Multidimensional Assessment  Dimension 1:  Acute Intoxication and/or Withdrawal Potential:      Dimension 2:  Biomedical Conditions and Complications:      Dimension 3:  Emotional, Behavioral, or Cognitive Conditions and Complications:     Dimension 4:  Readiness to Change:     Dimension 5:  Relapse, Continued use, or Continued Problem Potential:     Dimension 6:  Recovery/Living Environment:     ASAM Severity Score:    ASAM Recommended Level of Treatment:     Substance use Disorder (SUD)    Recommendations for Services/Supports/Treatments:    DSM5 Diagnoses: Patient Active Problem List   Diagnosis Date Noted  . Irregular menstrual cycle 03/08/2020  . Menorrhagia with irregular cycle 03/08/2020  . MDD (major depressive disorder), recurrent episode, severe (HCC) 11/12/2019  . Suicidal ideation   . Schizoaffective disorder, bipolar type (HCC)   . Depression 05/21/2018  . Overactive bladder 01/19/2018  . Schizophrenia (HCC) 01/19/2018     Referrals to Alternative Service(s): Referred to Alternative Service(s):   Place:   Date:   Time:    Referred to Alternative Service(s):   Place:   Date:   Time:    Referred to Alternative Service(s):   Place:   Date:   Time:    Referred to Alternative Service(s):   Place:   Date:   Time:     Meryle Ready, Counselor

## 2020-10-05 NOTE — ED Notes (Signed)
Sort NT brought back pt belongings from lobby that had been sitting in lobby since pt was brought back to room. Sort NT unsure why pt belongings were left in lobby. Sort NT stated another pt claimed that [name] left her belongings with them because she didn't think that pt would get seen that quickly. This pt then asked sort NT to do something with the pt's belongings so he could leave. This pt couldn't even tell Sort NT the pt's name. Unsure how long pt's belongings were left unattended in lobby before brought back.

## 2020-10-05 NOTE — Progress Notes (Signed)
Pt. meets criteria for inpatient treatment per Marciano Sequin, NP. Referred out to the following hospitals:   Destination  Service Provider Address Phone Fax  [facility]  [address] [place], [place] [address] [phone]  [facility]  [address] [place] [address] [phone]  [facility]  [address] [place] [address] [phone]  [facility]  [address] [place], [place] [address] [phone]  [facility]  [address] [place] [address] [phone]  [facility]  [address] [place] [address] [phone]    Disposition CSW will continue to follow for placement    Signed:  Corky Crafts, MSW, [place], Bridget Hartshorn 10/05/2020 2:39 PM

## 2020-10-05 NOTE — ED Notes (Signed)
AM eval Breakfast order placed 

## 2020-10-05 NOTE — ED Notes (Signed)
pts belongings placed in Purple zone (locked area) - 4 bags - 1 black duffel, 1 floral printed bag, 1 striped bag and 1 pts belongings bag - all labeled with pts stickers.
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
## 2020-10-06 NOTE — ED Notes (Signed)
Patient resting with eyes closed NAD. Connected to BP and Pulse ox. Respirations regular/unlabored.

## 2020-10-06 NOTE — ED Notes (Signed)
Assumed care of this patient. Vitals taken. NAD. Respirations regular/unlabored. Pt resting in stretcher with eyes closed. No further concerns at this time.

## 2020-10-06 NOTE — ED Notes (Signed)
Pt sitting up in bed eating lunch. Denies any complaints at this time.

## 2020-10-06 NOTE — ED Notes (Signed)
Dinner Tray Ordered @ 1711. 

## 2020-10-06 NOTE — ED Notes (Signed)
Attempting at this time to have pt change into scrubs

## 2020-10-06 NOTE — ED Provider Notes (Signed)
Emergency Medicine Observation Re-evaluation Note  Patricia [name] is a 50 y.o. female, seen on rounds today.  Pt initially presented to the ED for complaints of Hallucinations and Loss of Consciousness Currently, the patient is sleeping.  Physical Exam  BP 110/82   Pulse 60   Temp 97.8 F (36.6 C) (Oral)   Resp 17   SpO2 96%  Physical Exam General: No apparent distress Cardiac: Regular rate Lungs: No respiratory distress Psych: No significant changes  ED Course / MDM  EKG:EKG Interpretation  Date/Time:  Wednesday October 04 2020 22:51:24 EST Ventricular Rate:  79 PR Interval:  118 QRS Duration: 65 QT Interval:  381 QTC Calculation: 437 R Axis:   71 Text Interpretation: Sinus rhythm Biatrial enlargement ST elev, probable normal early repol pattern When compared with ECG of EARLIER SAME DATE No significant change was found Confirmed by Dione Booze ([id number]) on 10/05/2020 4:46:33 AM    I have reviewed the labs performed to date as well as medications administered while in observation.    Plan  Current plan is for inpatient psych placement.  Bed search underway. Patient is not under full IVC at this time.   Terald Sleeper, MD 10/06/20 [phone]

## 2020-10-06 NOTE — ED Notes (Signed)
Attempted to take pt's temperature. Pt states, "I don't do that." RN offered for pt to hold temperature probe by self and pt continues to state, "No I don't do that. I took it when I first got here but won't do it again."

## 2020-10-06 NOTE — ED Notes (Signed)
Pt only willing to give pants, shoes and socks at this time. Pt was changed into scrub, will not part with jewel. Belongings placed in locker room with pt other belongings

## 2020-10-07 ENCOUNTER — Inpatient Hospital Stay (HOSPITAL_COMMUNITY)
Admission: AD | Admit: 2020-10-07 | Discharge: 2020-10-13 | DRG: 885 | Disposition: A | Discharge status: Home / Self Care | Payer: Federal, State, Local not specified - Other | Source: Other Acute Inpatient Hospital | Attending: Psychiatry | Admitting: Psychiatry

## 2020-10-07 ENCOUNTER — Encounter (HOSPITAL_COMMUNITY): Payer: Self-pay | Admitting: Psychiatric/Mental Health

## 2020-10-07 ENCOUNTER — Other Ambulatory Visit: Payer: Self-pay | Admitting: Psychiatry

## 2020-10-07 ENCOUNTER — Other Ambulatory Visit: Payer: Self-pay

## 2020-10-07 DIAGNOSIS — F17210 Nicotine dependence, cigarettes, uncomplicated: Secondary | ICD-10-CM | POA: Diagnosis present

## 2020-10-07 DIAGNOSIS — F410 Panic disorder [episodic paroxysmal anxiety] without agoraphobia: Secondary | ICD-10-CM | POA: Diagnosis present

## 2020-10-07 DIAGNOSIS — F250 Schizoaffective disorder, bipolar type: Secondary | ICD-10-CM | POA: Diagnosis present

## 2020-10-07 DIAGNOSIS — E785 Hyperlipidemia, unspecified: Secondary | ICD-10-CM | POA: Diagnosis present

## 2020-10-07 DIAGNOSIS — I10 Essential (primary) hypertension: Secondary | ICD-10-CM | POA: Diagnosis present

## 2020-10-07 DIAGNOSIS — Z20822 Contact with and (suspected) exposure to covid-19: Secondary | ICD-10-CM | POA: Diagnosis present

## 2020-10-07 DIAGNOSIS — Z79899 Other long term (current) drug therapy: Secondary | ICD-10-CM

## 2020-10-07 DIAGNOSIS — Z9119 Patient's noncompliance with other medical treatment and regimen: Secondary | ICD-10-CM | POA: Diagnosis not present

## 2020-10-07 DIAGNOSIS — D508 Other iron deficiency anemias: Secondary | ICD-10-CM | POA: Diagnosis present

## 2020-10-07 DIAGNOSIS — G4700 Insomnia, unspecified: Secondary | ICD-10-CM | POA: Diagnosis present

## 2020-10-07 DIAGNOSIS — F319 Bipolar disorder, unspecified: Secondary | ICD-10-CM | POA: Diagnosis present

## 2020-10-07 DIAGNOSIS — K5909 Other constipation: Secondary | ICD-10-CM | POA: Diagnosis present

## 2020-10-07 DIAGNOSIS — R45851 Suicidal ideations: Secondary | ICD-10-CM | POA: Diagnosis present

## 2020-10-07 LAB — POC SARS CORONAVIRUS 2 AG -  ED: SARS Coronavirus 2 Ag: NEGATIVE

## 2020-10-07 NOTE — ED Notes (Signed)
Safe Transport to call this RN back to get pt information.

## 2020-10-07 NOTE — ED Notes (Signed)
Pt sleeping without distress. Resp even and non-labored. No complaints at this time. Will continue to monitor.  

## 2020-10-07 NOTE — ED Notes (Signed)
Report to Erica, RN

## 2020-10-07 NOTE — ED Notes (Signed)
Per Rosey Bath, RN (via secure chat) pt has been accepted at [facility], room 507 - pt can be sent after 1930 today. This RN attempted to give report [phone]), nurse unavailable at this time. This RN gave number to call back.
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
## 2020-10-07 NOTE — ED Notes (Signed)
Safe Transport to pick up pt at [facility].

## 2020-10-07 NOTE — ED Provider Notes (Signed)
Emergency Medicine Observation Re-evaluation Note  Patricia [name] is a 50 y.o. female, seen on rounds today.  Pt initially presented to the ED for complaints of Hallucinations and Loss of Consciousness Currently, the patient is sitting and eating breakfast.  No acute events overnight.  Physical Exam  BP 110/72    Pulse 66    Temp 98.2 F (36.8 C) (Oral)    Resp 18    SpO2 99%  Physical Exam General: Sitting in a breakfast, pleasant Cardiac: Regular rate and rhythm Lungs: Clear to auscultation, no labored breathing Psych: Pleasant, does not appear paranoid at this moment.  ED Course / MDM  EKG:EKG Interpretation  Date/Time:  Wednesday October 04 2020 22:51:24 EST Ventricular Rate:  79 PR Interval:  118 QRS Duration: 65 QT Interval:  381 QTC Calculation: 437 R Axis:   71 Text Interpretation: Sinus rhythm Biatrial enlargement ST elev, probable normal early repol pattern When compared with ECG of EARLIER SAME DATE No significant change was found Confirmed by Dione Booze ([id number]) on 10/05/2020 4:46:33 AM    I have reviewed the labs performed to date as well as medications administered while in observation.  Recent changes in the last 24 hours include none  Plan  Current plan is for inpatient psych placement. Patient is not under full IVC at this time.   Farrel Gordon, PA-C 10/07/20 1448    Pollyann Savoy, MD 10/07/20 [id number]

## 2020-10-07 NOTE — ED Notes (Addendum)
Called give report to [facility] to report neg. COVID. [phone], Stated the [facility] working on that will call us back.
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
## 2020-10-07 NOTE — Progress Notes (Signed)
Per Bronwen Betters, MD patient continues to meet criteria for inpatient treatment. There are no available or appropriate beds at [facility] today. CSW refaxed referrals to the following facilities for review:  [facility]  [place]  [place]  [place]   TTS will continue to seek bed placement.    Crissie Reese, MSW, LCSW-A, LCAS-A Phone: [phone] Disposition/TOC

## 2020-10-08 DIAGNOSIS — F319 Bipolar disorder, unspecified: Secondary | ICD-10-CM | POA: Diagnosis present

## 2020-10-08 MED ORDER — OLANZAPINE 5 MG PO TBDP
5.0000 mg | ORAL_TABLET | ORAL | Status: AC
  Administered 2020-10-08: 5 mg via ORAL
  Filled 2020-10-08 (×2): qty 1

## 2020-10-08 MED ORDER — ALUM & MAG HYDROXIDE-SIMETH 200-200-20 MG/5ML PO SUSP: 30.0000 mL | ORAL | Status: DC | PRN

## 2020-10-08 MED ORDER — OLANZAPINE 5 MG PO TBDP
5.0000 mg | ORAL_TABLET | Freq: Every day | ORAL | Status: DC
  Administered 2020-10-08: 5 mg via ORAL
  Filled 2020-10-08 (×3): qty 1

## 2020-10-08 MED ORDER — FOLIC ACID 1 MG PO TABS
1.0000 mg | ORAL_TABLET | Freq: Every day | ORAL | Status: DC
  Administered 2020-10-08 – 2020-10-09 (×2): 1 mg via ORAL
  Filled 2020-10-08 (×4): qty 1

## 2020-10-08 MED ORDER — TRAZODONE HCL 50 MG PO TABS
50.0000 mg | ORAL_TABLET | Freq: Every evening | ORAL | Status: DC | PRN
  Filled 2020-10-08: qty 1
  Filled 2020-10-08: qty 7

## 2020-10-08 MED ORDER — OLANZAPINE 10 MG PO TBDP
10.0000 mg | ORAL_TABLET | Freq: Every day | ORAL | Status: DC
Start: 2020-10-09 — End: 2020-10-13
  Administered 2020-10-09 – 2020-10-13 (×5): 10 mg via ORAL
  Filled 2020-10-08: qty 1
  Filled 2020-10-08: qty 7
  Filled 2020-10-08 (×3): qty 1
  Filled 2020-10-08: qty 7
  Filled 2020-10-08 (×2): qty 1

## 2020-10-08 MED ORDER — HYDROXYZINE HCL 25 MG PO TABS
25.0000 mg | ORAL_TABLET | Freq: Three times a day (TID) | ORAL | Status: DC | PRN
  Filled 2020-10-08: qty 10

## 2020-10-08 MED ORDER — LORAZEPAM 1 MG PO TABS: 1.0000 mg | ORAL_TABLET | Freq: Four times a day (QID) | ORAL | Status: DC | PRN

## 2020-10-08 MED ORDER — OLANZAPINE 10 MG PO TBDP
10.0000 mg | ORAL_TABLET | Freq: Every day | ORAL | Status: DC
  Filled 2020-10-08: qty 1

## 2020-10-08 MED ORDER — OLANZAPINE 5 MG PO TBDP
15.0000 mg | ORAL_TABLET | Freq: Every day | ORAL | Status: DC
  Administered 2020-10-08: 15 mg via ORAL
  Filled 2020-10-08 (×2): qty 1

## 2020-10-08 MED ORDER — THIAMINE HCL 100 MG PO TABS
100.0000 mg | ORAL_TABLET | Freq: Every day | ORAL | Status: DC
  Administered 2020-10-08 – 2020-10-09 (×2): 100 mg via ORAL
  Filled 2020-10-08 (×4): qty 1

## 2020-10-08 MED ORDER — ACETAMINOPHEN 325 MG PO TABS
650.0000 mg | ORAL_TABLET | Freq: Four times a day (QID) | ORAL | Status: DC | PRN
  Administered 2020-10-09 – 2020-10-10 (×2): 650 mg via ORAL
  Filled 2020-10-08 (×2): qty 2

## 2020-10-08 MED ORDER — MAGNESIUM HYDROXIDE 400 MG/5ML PO SUSP: 30.0000 mL | Freq: Every day | ORAL | Status: DC | PRN

## 2020-10-08 MED ORDER — OLANZAPINE 10 MG PO TBDP: 10.0000 mg | ORAL_TABLET | Freq: Three times a day (TID) | ORAL | Status: DC | PRN

## 2020-10-08 MED ORDER — ZIPRASIDONE MESYLATE 20 MG IM SOLR
20.0000 mg | Freq: Four times a day (QID) | INTRAMUSCULAR | Status: DC | PRN
Start: 2020-10-08 — End: 2020-10-13

## 2020-10-08 NOTE — Plan of Care (Signed)

## 2020-10-08 NOTE — B[facility] Suicide Risk Assessment (Signed)
[facility] Admission Suicide Risk Assessment   Nursing information obtained from:  Patient Demographic factors:  Living alone,Unemployed,Low socioeconomic status Current Mental Status:  NA Loss Factors:  NA Historical Factors:  Family history of mental illness or substance abuse Risk Reduction Factors:  NA  Total Time spent with patient: 30 minutes Principal Problem: <principal problem not specified> Diagnosis:  Active Problems:   Schizoaffective disorder, bipolar type (HCC)   Bipolar disorder (HCC)  Subjective Data: Patient is seen and examined.  Patient is a 50 year old female with a reported past psychiatric history significant for schizoaffective disorder; bipolar type who originally presented to the [facility] emergency department on 10/04/2020 reporting that she had become anorexic and was not eating.  She admitted to having dizziness, passing out, having auditory and visual hallucinations, and feeling as though people were following her.  Comprehensive clinical assessment revealed that she had been accompanied by her uncle.  Unfortunately information was unable to be collected from him.  The patient reported feeling as though people were following her, and also experiencing visual hallucinations.  She apparently told the staff that when she becomes frustrated she bangs her head against the wall.  She reported a decrease in appetite, and also had stopped eating and was vomiting.  She reported sleeping about 4 5 hours a night.  On examination she is quite irritable, and really does not disclose when she last took her last medications.  In consultation she was essentially unchanged on 10/05/2020.  In her last psychiatric hospitalization at our facility on 11/11/2019 her discharge medications included Abilify long-acting injectable, Risperdal and temazepam.  The patient reported an allergy to Risperdal, and while being held in the emergency department received Zyprexa.  Because of the  exacerbation of her psychiatric illness she was transferred to our facility on 11/05/2020.  On examination today she is very labile.  She goes from being fairly pleasant to angry immediately.  She has pressured speech and is agitated.  She was admitted to the hospital for evaluation and stabilization.  Continued Clinical Symptoms:  Alcohol Use Disorder Identification Test Final Score (AUDIT): 1 The "Alcohol Use Disorders Identification Test", Guidelines for Use in Primary Care, Second Edition.  World Science writer [facility]). Score between 0-7:  no or low risk or alcohol related problems. Score between 8-15:  moderate risk of alcohol related problems. Score between 16-19:  high risk of alcohol related problems. Score 20 or above:  warrants further diagnostic evaluation for alcohol dependence and treatment.   CLINICAL FACTORS:   Schizophrenia:   Paranoid or undifferentiated type   Musculoskeletal: Strength & Muscle Tone: within normal limits Gait & Station: normal Patient leans: N/A  Psychiatric Specialty Exam: Physical Exam Vitals and nursing note reviewed.  Constitutional:      Appearance: Normal appearance.  HENT:     Head: Normocephalic and atraumatic.  Pulmonary:     Effort: Pulmonary effort is normal.  Neurological:     General: No focal deficit present.     Mental Status: She is alert.     Review of Systems  Blood pressure 120/72, pulse 91, temperature 98.5 F (36.9 C), temperature source Oral, resp. rate 18, height 5' 3.5" (1.613 m), weight 72.6 kg, SpO2 100 %.Body mass index is 27.9 kg/m.  General Appearance: Disheveled  Eye Contact:  Good  Speech:  Pressured  Volume:  Increased  Mood:  Dysphoric and Irritable  Affect:  Labile  Thought Process:  Coherent and Descriptions of Associations: Tangential  Orientation:  Full (Time, Place, and Person)  Thought Content:  Illogical, Delusions, Hallucinations: Auditory and Paranoid Ideation  Suicidal Thoughts:  No   Homicidal Thoughts:  No  Memory:  Immediate;   Poor Recent;   Poor Remote;   Poor  Judgement:  Impaired  Insight:  Lacking  Psychomotor Activity:  Increased  Concentration:  Concentration: Poor and Attention Span: Poor  Recall:  Poor  Fund of Knowledge:  Poor  Language:  Good  Akathisia:  Negative  Handed:  Right  AIMS (if indicated):     Assets:  Desire for Improvement Resilience  ADL's:  Intact  Cognition:  WNL  Sleep:  Number of Hours: 6.75      COGNITIVE FEATURES THAT CONTRIBUTE TO RISK:  Thought constriction (tunnel vision)    SUICIDE RISK:   Moderate:  Frequent suicidal ideation with limited intensity, and duration, some specificity in terms of plans, no associated intent, good self-control, limited dysphoria/symptomatology, some risk factors present, and identifiable protective factors, including available and accessible social support.  PLAN OF CARE: Patient is seen and examined.  Patient is a 51 year old female with the above-stated past psychiatric history who was admitted for exacerbation of her psychiatric illness.  She will be admitted to the hospital.  She will be integrated in the milieu.  She will be encouraged to attend groups.  Given what appears to be the severity of her illness I am going to increase her olanzapine dosage.  We will put her on 10 mg p.o. daily and 15 mg p.o. nightly and titrate that during the course of the hospitalization.  We will also attempt to get the long-acting Abilify injection in her or some other long-acting injectable once she is stabilized.  Review of her admission laboratories revealed a blood sugar of 95 on 10/04/2020.  The rest of her electrolytes were normal.  Liver function enzymes were not obtained but her creatinine was 0.64.  We will get a metabolic panel.  Her CBC showed a significant anemia with a hemoglobin of 10.5 and hematocrit of 31.8.  This is significantly lower than previous.  7 months ago her hemoglobin was 11.8  hematocrit was 37.2.  Her baseline appears to be between 33 and 35.  This may be related to her anorexic symptoms from her illness.  We will go on and get a anemia panel to assess this.  Platelets were normal at 293,000.  Acetaminophen was less than 10, salicylate less than 7.  Beta-hCG was negative.  Her respiratory panel for influenza A, B, coronavirus were all negative.  Urinalysis showed a moderate amount of blood, negative nitrite, negative leukocytes.  There were rare bacteria.  Unfortunately was not a good sample with 6-10 squamous epithelial cells, but there were only 0-5 white blood cells.  Blood alcohol was less than 10, drug screen was negative.  EKG suggested biatrial enlargement, but was in normal sinus rhythm with a normal QTc interval.  Her vital signs are stable, she is afebrile.  Pulse oximetry was 99% on room air.  We will also go on and order a TSH in case.  I certify that inpatient services furnished can reasonably be expected to improve the patient's condition.   Antonieta Pert, MD 10/08/2020, 11:31 AM

## 2020-10-08 NOTE — BHH Group Notes (Signed)
Adult Psychoeducational Group Not Date:  10/08/2020 Time:  0354-6568 Group Topic/Focus: PROGRESSIVE RELAXATION. A group where deep breathing is taught and tensing and relaxation muscle groups is used. Imagery is used as well.  Pts are asked to imagine 3 pillars that hold them up when they are not able to hold themselves up.  Participation Level:  Active  Participation Quality:  Appropriate  Affect:  Appropriate  Cognitive:  Oriented  Insight: Improving  Engagement in Group:  Engaged  Modes of Intervention:  Activity, Discussion, Education, and Support  Additional Comments:  Rats her energy at a 4/10. States what holds her up is basketball, friends and her close family  [employer] Housekeeper

## 2020-10-08 NOTE — Progress Notes (Signed)
Patient reports having had a good day. She reports that she has not had any visual hallucinations today. She reports taking a nap on today and feels better. Writer informed her of her medications. Support given and safety maintained with 15 min checks.

## 2020-10-08 NOTE — H&P (Signed)
Psychiatric Admission Assessment Adult  Patient Identification: Patricia [name] MRN:  [id number] Date of Evaluation:  10/08/2020 Chief Complaint:  Schizoaffective disorder, bipolar type ([place]) [F25.0] Bipolar disorder ([place]) [F31.9] Principal Diagnosis: <principal problem not specified> Diagnosis:  Active Problems:   Schizoaffective disorder, bipolar type ([place])   Bipolar disorder ([place])  History of Present Illness: Patient is seen and examined.  Patient is a 50 year old female with a reported past psychiatric history significant for schizoaffective disorder; bipolar type who originally presented to the [facility] emergency department on 10/04/2020 reporting that she had become anorexic and was not eating.  She admitted to having dizziness, passing out, having auditory and visual hallucinations, and feeling as though people were following her.  Comprehensive clinical assessment revealed that she had been accompanied by her uncle.  Unfortunately information was unable to be collected from him.  The patient reported feeling as though people were following her, and also experiencing visual hallucinations.  She apparently told the staff that when she becomes frustrated she bangs her head against the wall.  She reported a decrease in appetite, and also had stopped eating and was vomiting.  She reported sleeping about 4 5 hours a night.  On examination she is quite irritable, and really does not disclose when she last took her last medications.  In consultation she was essentially unchanged on 10/05/2020.  In her last psychiatric hospitalization at our facility on 11/11/2019 her discharge medications included Abilify long-acting injectable, Risperdal and temazepam.  The patient reported an allergy to Risperdal, and while being held in the emergency department received Zyprexa.  Because of the exacerbation of her psychiatric illness she was transferred to our facility on 11/05/2020.  On  examination today she is very labile.  She goes from being fairly pleasant to angry immediately.  She has pressured speech and is agitated.  She was admitted to the hospital for evaluation and stabilization.  Associated Signs/Symptoms: Depression Symptoms:  anhedonia, insomnia, psychomotor agitation, difficulty concentrating, suicidal thoughts without plan, anxiety, panic attacks, loss of energy/fatigue, disturbed sleep, Duration of Depression Symptoms: No data recorded (Hypo) Manic Symptoms:  Delusions, Distractibility, Grandiosity, Hallucinations, Impulsivity, Irritable Mood, Labiality of Mood, Anxiety Symptoms:  Excessive Worry, Psychotic Symptoms:  Delusions, Hallucinations: Auditory Paranoia, Duration of Psychotic Symptoms: Less than six months  PTSD Symptoms: Negative Total Time spent with patient: 45 minutes  Past Psychiatric History: Patient has been admitted to the psychiatric hospital on multiple occasions.  The last time she was admitted to our facility was in April 2021.  Prior to that was in November 2019.  She has been treated with olanzapine, Risperdal and other antipsychotic medications.  She now states that she is allergic to Risperdal.  Is the patient at risk to self? Yes.    Has the patient been a risk to self in the past 6 months? No.  Has the patient been a risk to self within the distant past? Yes.    Is the patient a risk to others? No.  Has the patient been a risk to others in the past 6 months? No.  Has the patient been a risk to others within the distant past? No.   Prior Inpatient Therapy:   Prior Outpatient Therapy:    Alcohol Screening: 1. How often do you have a drink containing alcohol?: Monthly or less 2. How many drinks containing alcohol do you have on a typical day when you are drinking?: 1 or 2 3. How often do you have six or  more drinks on one occasion?: Never AUDIT-C Score: 1 4. How often during the last year have you found that you  were not able to stop drinking once you had started?: Never 5. How often during the last year have you failed to do what was normally expected from you because of drinking?: Never 6. How often during the last year have you needed a first drink in the morning to get yourself going after a heavy drinking session?: Never 7. How often during the last year have you had a feeling of guilt of remorse after drinking?: Never 8. How often during the last year have you been unable to remember what happened the night before because you had been drinking?: Never 9. Have you or someone else been injured as a result of your drinking?: No 10. Has a relative or friend or a doctor or another health worker been concerned about your drinking or suggested you cut down?: No Alcohol Use Disorder Identification Test Final Score (AUDIT): 1 Alcohol Brief Interventions/Follow-up: AUDIT Score <7 follow-up not indicated Substance Abuse History in the last 12 months:  No. Consequences of Substance Abuse: Negative Previous Psychotropic Medications: Yes  Psychological Evaluations: Yes  Past Medical History:  Past Medical History:  Diagnosis Date  . Depression    History reviewed. No pertinent surgical history. Family History: History reviewed. No pertinent family history. Family Psychiatric  History: Patient refused to answer.  The old chart stated that dementia runs in her family. Tobacco Screening: Have you used any form of tobacco in the last 30 days? (Cigarettes, Smokeless Tobacco, Cigars, and/or Pipes): Yes Tobacco use, Select all that apply: 4 or less cigarettes per day Are you interested in Tobacco Cessation Medications?: No, patient refused Counseled patient on smoking cessation including recognizing danger situations, developing coping skills and basic information about quitting provided: Refused/Declined practical counseling Social History:  Social History   Substance and Sexual Activity  Alcohol Use Yes  .  Alcohol/week: 1.0 standard drink  . Types: 1 Glasses of wine per week   Comment: social      Social History   Substance and Sexual Activity  Drug Use No    Additional Social History:                           Allergies:   Allergies  Allergen Reactions  . Oxycodone Shortness Of Breath and Palpitations  . Risperdal [Risperidone] Swelling    Tongue swelling  . Latex Itching and Other (See Comments)    "Makes me itch and burn"   Lab Results:  Results for orders placed or performed during the hospital encounter of 10/04/20 (from the past 48 hour(s))  POC SARS Coronavirus 2 Ag-ED - Nasal Swab (BD Veritor Kit)     Status: None   Collection Time: 10/07/20  2:20 PM  Result Value Ref Range   SARS Coronavirus 2 Ag NEGATIVE NEGATIVE    Comment: (NOTE) SARS-CoV-2 antigen NOT DETECTED.   Negative results are presumptive.  Negative results do not preclude SARS-CoV-2 infection and should not be used as the sole basis for treatment or other patient management decisions, including infection  control decisions, particularly in the presence of clinical signs and  symptoms consistent with COVID-19, or in those who have been in contact with the virus.  Negative results must be combined with clinical observations, patient history, and epidemiological information. The expected result is Negative.  Fact Sheet for Patients: HandmadeRecipes.com.cy  Fact Sheet  for Healthcare Providers: FuneralLife.at  This test is not yet approved or cleared by the [place] and  has been authorized for detection and/or diagnosis of SARS-CoV-2 by FDA under an Emergency Use Authorization (EUA).  This EUA will remain in effect (meaning this test can be used) for the duration of  the COV ID-19 declaration under Section 564(b)(1) of the Act, 21 U.S.C. section 360bbb-3(b)(1), unless the authorization is terminated or revoked sooner.      Blood  Alcohol level:  Lab Results  Component Value Date   ETH <10 10/04/2020   ETH <10 23/76/2831    Metabolic Disorder Labs:  Lab Results  Component Value Date   HGBA1C 5.5 05/24/2018   MPG 111.15 05/24/2018   No results found for: PROLACTIN No results found for: CHOL, TRIG, HDL, CHOLHDL, VLDL, LDLCALC  Current Medications: Current Facility-Administered Medications  Medication Dose Route Frequency Provider Last Rate Last Admin  . acetaminophen (TYLENOL) tablet 650 mg  650 mg Oral Q6H PRN Sharma Covert, MD      . alum & mag hydroxide-simeth (MAALOX/MYLANTA) 200-200-20 MG/5ML suspension 30 mL  30 mL Oral Q4H PRN Sharma Covert, MD      . folic acid (FOLVITE) tablet 1 mg  1 mg Oral Daily Sharma Covert, MD   1 mg at 10/08/20 1230  . hydrOXYzine (ATARAX/VISTARIL) tablet 25 mg  25 mg Oral TID PRN Sharma Covert, MD      . OLANZapine zydis (ZYPREXA) disintegrating tablet 10 mg  10 mg Oral Q8H PRN Sharma Covert, MD       And  . LORazepam (ATIVAN) tablet 1 mg  1 mg Oral Q6H PRN Sharma Covert, MD       And  . ziprasidone (GEODON) injection 20 mg  20 mg Intramuscular Q6H PRN Sharma Covert, MD      . magnesium hydroxide (MILK OF MAGNESIA) suspension 30 mL  30 mL Oral Daily PRN Sharma Covert, MD      . Derrill Memo ON 10/09/2020] OLANZapine zydis (ZYPREXA) disintegrating tablet 10 mg  10 mg Oral Daily Sharma Covert, MD      . OLANZapine zydis (ZYPREXA) disintegrating tablet 15 mg  15 mg Oral QHS Sharma Covert, MD      . thiamine tablet 100 mg  100 mg Oral Daily Sharma Covert, MD   100 mg at 10/08/20 1230  . traZODone (DESYREL) tablet 50 mg  50 mg Oral QHS PRN Sharma Covert, MD       PTA Medications: Medications Prior to Admission  Medication Sig Dispense Refill Last Dose  . acetaminophen (TYLENOL) 500 MG tablet Take 500 mg by mouth daily.   Unknown at Unknown time  . BIOTIN PO Take 1 tablet by mouth daily.   Unknown at Unknown time  . vitamin  B-12 (CYANOCOBALAMIN) 100 MCG tablet Take 100 mcg by mouth daily.   Unknown at Unknown time    Musculoskeletal: Strength & Muscle Tone: within normal limits Gait & Station: normal Patient leans: N/A  Psychiatric Specialty Exam: Physical Exam Vitals and nursing note reviewed.  HENT:     Head: Normocephalic and atraumatic.  Pulmonary:     Effort: Pulmonary effort is normal.  Neurological:     General: No focal deficit present.     Mental Status: She is alert and oriented to person, place, and time.     Review of Systems  Blood pressure 120/72, pulse 91, temperature 98.5 F (  36.9 C), temperature source Oral, resp. rate 18, height 5' 3.5" (1.613 m), weight 72.6 kg, SpO2 100 %.Body mass index is 27.9 kg/m.  General Appearance: Disheveled  Eye Contact:  Good  Speech:  Pressured  Volume:  Increased  Mood:  Anxious, Dysphoric and Irritable  Affect:  Labile  Thought Process:  Goal Directed and Descriptions of Associations: Loose  Orientation:  Full (Time, Place, and Person)  Thought Content:  Illogical, Delusions, Hallucinations: Auditory, Paranoid Ideation and Rumination  Suicidal Thoughts:  No  Homicidal Thoughts:  No  Memory:  Immediate;   Poor Recent;   Poor Remote;   Poor  Judgement:  Impaired  Insight:  Lacking  Psychomotor Activity:  Increased  Concentration:  Concentration: Fair  Recall:  Poor  Fund of Knowledge:  Poor  Language:  Good  Akathisia:  Negative  Handed:  Right  AIMS (if indicated):     Assets:  Desire for Improvement Resilience  ADL's:  Intact  Cognition:  WNL  Sleep:  Number of Hours: 6.75    Treatment Plan Summary: Daily contact with patient to assess and evaluate symptoms and progress in treatment, Medication management and Plan : Patient is seen and examined.  Patient is a 50 year old female with the above-stated past psychiatric history who was admitted for exacerbation of her psychiatric illness.  She will be admitted to the hospital.  She  will be integrated in the milieu.  She will be encouraged to attend groups.  Given what appears to be the severity of her illness I am going to increase her olanzapine dosage.  We will put her on 10 mg p.o. daily and 15 mg p.o. nightly and titrate that during the course of the hospitalization.  We will also attempt to get the long-acting Abilify injection in her or some other long-acting injectable once she is stabilized.  Review of her admission laboratories revealed a blood sugar of 95 on 10/04/2020.  The rest of her electrolytes were normal.  Liver function enzymes were not obtained but her creatinine was 0.64.  We will get a metabolic panel.  Her CBC showed a significant anemia with a hemoglobin of 10.5 and hematocrit of 31.8.  This is significantly lower than previous.  7 months ago her hemoglobin was 11.8 hematocrit was 37.2.  Her baseline appears to be between 33 and 35.  This may be related to her anorexic symptoms from her illness.  We will go on and get a anemia panel to assess this.  Platelets were normal at 293,000.  Acetaminophen was less than 10, salicylate less than 7.  Beta-hCG was negative.  Her respiratory panel for influenza A, B, coronavirus were all negative.  Urinalysis showed a moderate amount of blood, negative nitrite, negative leukocytes.  There were rare bacteria.  Unfortunately was not a good sample with 6-10 squamous epithelial cells, but there were only 0-5 white blood cells.  Blood alcohol was less than 10, drug screen was negative.  EKG suggested biatrial enlargement, but was in normal sinus rhythm with a normal QTc interval.  Her vital signs are stable, she is afebrile.  Pulse oximetry was 99% on room air.  We will also go on and order a TSH in case.  Observation Level/Precautions:  15 minute checks  Laboratory:  Chemistry Profile  Psychotherapy:    Medications:    Consultations:    Discharge Concerns:    Estimated LOS:  Other:     Physician Treatment Plan for Primary  Diagnosis: <principal problem not  specified> Long Term Goal(s): Improvement in symptoms so as ready for discharge  Short Term Goals: Ability to identify changes in lifestyle to reduce recurrence of condition will improve, Ability to verbalize feelings will improve, Ability to demonstrate self-control will improve, Ability to identify and develop effective coping behaviors will improve, Ability to maintain clinical measurements within normal limits will improve and Compliance with prescribed medications will improve  Physician Treatment Plan for Secondary Diagnosis: Active Problems:   Schizoaffective disorder, bipolar type ([place])   Bipolar disorder ([place])  Long Term Goal(s): Improvement in symptoms so as ready for discharge  Short Term Goals: Ability to identify changes in lifestyle to reduce recurrence of condition will improve, Ability to verbalize feelings will improve, Ability to demonstrate self-control will improve, Ability to identify and develop effective coping behaviors will improve, Ability to maintain clinical measurements within normal limits will improve and Compliance with prescribed medications will improve  I certify that inpatient services furnished can reasonably be expected to improve the patient's condition.    Sharma Covert, MD 2/27/20222:22 PM

## 2020-10-08 NOTE — Progress Notes (Signed)
Progress note    10/08/20 0738  Psych Admission Type (Psych Patients Only)  Admission Status Voluntary  Psychosocial Assessment  Patient Complaints Anxiety;Restlessness;Suspiciousness  Eye Contact Fair;Watchful  Facial Expression Anxious  Affect Anxious;Preoccupied  Speech Logical/coherent  Interaction Cautious;Forwards little;Guarded;Minimal  Motor Activity Fidgety  Appearance/Hygiene Improved  Behavior Characteristics Cooperative;Appropriate to situation;Anxious  Mood Anxious;Suspicious;Preoccupied;Pleasant  Thought Process  Coherency Disorganized  Content Preoccupation;Paranoia  Delusions Paranoid  Perception Hallucinations  Hallucination Auditory;Visual  Judgment Poor  Confusion Mild  Danger to Self  Current suicidal ideation? Denies  Danger to Others  Danger to Others None reported or observed

## 2020-10-08 NOTE — Progress Notes (Signed)
   10/08/20 1948  COVID-19 Daily Checkoff  Have you had a fever (temp > 37.80C/100F)  in the past 24 hours?  No  If you have had runny nose, nasal congestion, sneezing in the past 24 hours, has it worsened? No  COVID-19 EXPOSURE  Have you traveled outside the state in the past 14 days? No  Have you been in contact with someone with a confirmed diagnosis of COVID-19 or PUI in the past 14 days without wearing appropriate PPE? No  Have you been living in the same home as a person with confirmed diagnosis of COVID-19 or a PUI (household contact)? No  Have you been diagnosed with COVID-19? No

## 2020-10-09 LAB — FOLATE: Folate: 14 ng/mL (ref >5.9)

## 2020-10-09 LAB — IRON AND TIBC
Iron: 41 ug/dL (ref 28–170)
Saturation Ratios: 10 % — ABNORMAL LOW (ref 10.4–31.8)
TIBC: 395 ug/dL (ref 250–450)
UIBC: 354 ug/dL

## 2020-10-09 LAB — RETICULOCYTES
Immature Retic Fract: 16.4 % — ABNORMAL HIGH (ref 2.3–15.9)
RBC.: 3.72 MIL/uL — ABNORMAL LOW (ref 3.87–5.11)
Retic Count, Absolute: 43.2 10*3/uL (ref 19.0–186.0)
Retic Ct Pct: 1.2 % (ref 0.4–3.1)

## 2020-10-09 LAB — LIPID PANEL
Cholesterol: 202 mg/dL — ABNORMAL HIGH (ref 0–200)
HDL: 52 mg/dL (ref >40)
LDL Cholesterol: 128 mg/dL — ABNORMAL HIGH (ref 0–99)
Total CHOL/HDL Ratio: 3.9 RATIO
Triglycerides: 108 mg/dL (ref <150)
VLDL: 22 mg/dL (ref 0–40)

## 2020-10-09 LAB — HEMOGLOBIN A1C
Hgb A1c MFr Bld: 5.5 % (ref 4.8–5.6)
Mean Plasma Glucose: 111.15 mg/dL

## 2020-10-09 LAB — VITAMIN B12: Vitamin B-12: 582 pg/mL (ref 180–914)

## 2020-10-09 LAB — TSH: TSH: 1.258 u[IU]/mL (ref 0.350–4.500)

## 2020-10-09 LAB — FERRITIN: Ferritin: 18 ng/mL (ref 11–307)

## 2020-10-09 MED ORDER — OLANZAPINE 10 MG PO TBDP
20.0000 mg | ORAL_TABLET | Freq: Every day | ORAL | Status: DC
  Administered 2020-10-09 – 2020-10-12 (×4): 20 mg via ORAL
  Filled 2020-10-09: qty 14
  Filled 2020-10-09: qty 2
  Filled 2020-10-09: qty 14
  Filled 2020-10-09 (×4): qty 2

## 2020-10-09 MED ORDER — TAB-A-VITE/IRON PO TABS
1.0000 | ORAL_TABLET | Freq: Every day | ORAL | Status: DC
  Administered 2020-10-09 – 2020-10-13 (×5): 1 via ORAL
  Filled 2020-10-09 (×7): qty 1

## 2020-10-09 NOTE — Progress Notes (Signed)
Progress note    10/09/20 0800  Psych Admission Type (Psych Patients Only)  Admission Status Voluntary  Psychosocial Assessment  Patient Complaints Anxiety;Disorientation  Eye Contact Fair  Facial Expression Anxious;Pensive  Affect Anxious;Preoccupied  Speech Logical/coherent  Interaction Cautious;Forwards little;Guarded;Minimal  Motor Activity Fidgety  Appearance/Hygiene Unremarkable  Behavior Characteristics Cooperative;Appropriate to situation;Anxious;Guarded  Mood Anxious;Suspicious;Preoccupied;Pleasant  Thought Process  Coherency Disorganized  Content Confabulation;Delusions;Paranoia  Delusions Grandeur;Paranoid  Perception Hallucinations  Hallucination Visual  Judgment Poor  Confusion Mild  Danger to Self  Current suicidal ideation? Denies  Danger to Others  Danger to Others None reported or observed
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
## 2020-10-09 NOTE — Progress Notes (Signed)
Adult Psychoeducational Group Note  Date:  10/09/2020 Time:  11:52 PM  Group Topic/Focus:  Wrap-Up Group:   The focus of this group is to help patients review their daily goal of treatment and discuss progress on daily workbooks.  Participation Level:  Minimal  Participation Quality:  Appropriate  Affect:  Anxious  Cognitive:  Appropriate  Insight: Limited  Engagement in Group:  Limited  Modes of Intervention:  Discussion  Additional Comments:  Pt stated her goal for today was to focus on her treatment plan. Pt stated she felt she accomplished her goal today. Pt stated she talk with her doctor and social worker, regarding her care today. Pt stated she took all her medication today from her providers. Pt stated been able to contact her uncle today improved her overall day. Pt stated her relationship with her family and support team has improved since she was admitted here. Pt rated her overall day a 5 out of 10 today. Pt stated she felt better about herself today. Pt stated her appetite was pretty good today and she attend all meals today. Pt stated her sleep last night was fair. Pt stated the goal for tonight was to get some rest. Pt stated she was in some physical pain. Pt stated she was dealing with some moderate pain in both ankles. Pt rated the pain a 6 on the pain rating scale. Pt deny auditory or visual hallucinations. Pt denies thoughts of harming herself or others. Pt stated she would alert staff if anything changes.  Felipa Furnace 10/09/2020, 11:52 PM

## 2020-10-09 NOTE — Progress Notes (Signed)
Recreation Therapy Notes  Date: 2.28.22 Time: 1000 Location: 500 Hall Dayroom  Group Topic: Coping Skills  Goal Area(s) Addresses:  Patient will identify positive coping skills. Patient will identify benefits of using coping skills post d/c.  Behavioral Response: Engaged  Intervention: Blank mind map  Activity: Mind Map.  LRT and patients will fill in the first 8 slots (anxiety, depression, stress, anger, sadness, dealing family/friends, pain and work) together.  Patients would then have time to individually come up with at least 3 coping skills for each area.  Patients would then come back together and LRT will write patient responses on board.  Education:Coping Skills, Discharge Planning.   Education Outcome: Acknowledges understanding/In group clarification offered/Needs additional education.   Clinical Observations/Feedback: Pt described coping skills as "strategies we use to deal with things".  Pt identified some of her coping skills as visualization, punching a pillow, medication, stretching, listen to music, drawing, relaxation, stress ball, venting, talk to a friend, honesty, "you messages", yoga, learn a new skill and talk to coworkers.  Pt was attentive and engaged throughout group.    Caroll Rancher, LRT/CTRS     Lillia Abed, Bobbie Virden A 10/09/2020 11:59 AM
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
## 2020-10-09 NOTE — [facility] Suicide Risk Assessment (Signed)
[facility] INPATIENT:  Family/Significant Other Suicide Prevention Education   Refusal of Consents:  Suicide Prevention Education:  Patient Refusal for Family/Significant Other Suicide Prevention Education: The patient Patricia [name] has refused to provide written consent for family/significant other to be provided Family/Significant Other Suicide Prevention Education during admission and/or prior to discharge.  Physician notified.   SPE completed with patient, as patient refused to consent to family contact. SPI pamphlet provided to pt and pt was encouraged to share information with support network, ask questions, and talk about any concerns relating to SPE. Patient denies access to guns/firearms and verbalized understanding of information provided. Mobile Crisis information also provided to patient.   Ruthann Cancer MSW, LCSW Clincal Social Worker  [facility]

## 2020-10-09 NOTE — Progress Notes (Signed)
Pt very argumentative and verbally aggressive. Writer tried to explain about certain medications and pt began to argue that Clinical research associate was wrong. Writer tried to explain that the medications ave a FDA and Non FDA usage, but pt continued to explain that certain medications were not what the FDA stated they were for. Pt began to state" I'm a Teacher, early years/pre, I've worked for [employer], [employer] got 14 doctorate degrees, so I know what I'm talking aboutPatent attorney tried to explain to pt , if she had any issues with the medications to discuss them with the doctor. Pt refused PRN Trazodone    10/09/20 2200  Psych Admission Type (Psych Patients Only)  Admission Status Voluntary  Psychosocial Assessment  Patient Complaints Anxiety;Suspiciousness;Disorientation  Eye Contact Fair  Facial Expression Anxious;Pensive  Affect Anxious;Preoccupied  Speech Logical/coherent;Argumentative  Interaction Cautious;Forwards little;Guarded;Minimal  Motor Activity Fidgety  Appearance/Hygiene Unremarkable  Behavior Characteristics Agressive verbally  Mood Suspicious;Anxious  Thought Process  Coherency Disorganized  Content Confabulation;Delusions;Paranoia  Delusions Grandeur;Paranoid  Perception Hallucinations  Hallucination Visual  Judgment Poor  Confusion Mild  Danger to Self  Current suicidal ideation? Denies  Danger to Others  Danger to Others None reported or observed

## 2020-10-09 NOTE — Tx Team (Signed)
Interdisciplinary Treatment and Diagnostic Plan Update  10/09/2020 Time of Session: 10;10am Patricia [name] MRN: [id number]  Principal Diagnosis: <principal problem not specified>  Secondary Diagnoses: Active Problems:   Schizoaffective disorder, bipolar type ([name])   Bipolar disorder ([name])   Current Medications:  Current Facility-Administered Medications  Medication Dose Route Frequency Provider Last Rate Last Admin   acetaminophen (TYLENOL) tablet 650 mg  650 mg Oral Q6H PRN Sharma Covert, MD   650 mg at 10/09/20 0800   alum & mag hydroxide-simeth (MAALOX/MYLANTA) 200-200-20 MG/5ML suspension 30 mL  30 mL Oral Q4H PRN Sharma Covert, MD       hydrOXYzine (ATARAX/VISTARIL) tablet 25 mg  25 mg Oral TID PRN Sharma Covert, MD       OLANZapine zydis (ZYPREXA) disintegrating tablet 10 mg  10 mg Oral Q8H PRN Sharma Covert, MD       And   LORazepam (ATIVAN) tablet 1 mg  1 mg Oral Q6H PRN Sharma Covert, MD       And   ziprasidone (GEODON) injection 20 mg  20 mg Intramuscular Q6H PRN Sharma Covert, MD       magnesium hydroxide (MILK OF MAGNESIA) suspension 30 mL  30 mL Oral Daily PRN Sharma Covert, MD       multivitamins with iron tablet 1 tablet  1 tablet Oral Daily Sharma Covert, MD       OLANZapine zydis (ZYPREXA) disintegrating tablet 10 mg  10 mg Oral Daily Sharma Covert, MD   10 mg at 10/09/20 0801   OLANZapine zydis (ZYPREXA) disintegrating tablet 20 mg  20 mg Oral QHS Sharma Covert, MD       traZODone (DESYREL) tablet 50 mg  50 mg Oral QHS PRN Sharma Covert, MD       PTA Medications: Medications Prior to Admission  Medication Sig Dispense Refill Last Dose   acetaminophen (TYLENOL) 500 MG tablet Take 500 mg by mouth daily.   Unknown at Unknown time   BIOTIN PO Take 1 tablet by mouth daily.   Unknown at Unknown time   vitamin B-12 (CYANOCOBALAMIN) 100 MCG tablet Take 100 mcg by mouth daily.   Unknown at  Unknown time    Patient Stressors: Medication change or noncompliance  Patient Strengths: Ability for insight Motivation for treatment/growth  Treatment Modalities: Medication Management, Group therapy, Case management,  1 to 1 session with clinician, Psychoeducation, Recreational therapy.   Physician Treatment Plan for Primary Diagnosis: <principal problem not specified> Long Term Goal(s): Improvement in symptoms so as ready for discharge Improvement in symptoms so as ready for discharge   Short Term Goals: Ability to identify changes in lifestyle to reduce recurrence of condition will improve Ability to verbalize feelings will improve Ability to demonstrate self-control will improve Ability to identify and develop effective coping behaviors will improve Ability to maintain clinical measurements within normal limits will improve Compliance with prescribed medications will improve Ability to identify changes in lifestyle to reduce recurrence of condition will improve Ability to verbalize feelings will improve Ability to demonstrate self-control will improve Ability to identify and develop effective coping behaviors will improve Ability to maintain clinical measurements within normal limits will improve Compliance with prescribed medications will improve  Medication Management: Evaluate patient's response, side effects, and tolerance of medication regimen.  Therapeutic Interventions: 1 to 1 sessions, Unit Group sessions and Medication administration.  Evaluation of Outcomes: Not Met  Physician Treatment Plan for Secondary Diagnosis: Active  Problems:   Schizoaffective disorder, bipolar type ([name])   Bipolar disorder ([name])  Long Term Goal(s): Improvement in symptoms so as ready for discharge Improvement in symptoms so as ready for discharge   Short Term Goals: Ability to identify changes in lifestyle to reduce recurrence of condition will improve Ability to verbalize feelings  will improve Ability to demonstrate self-control will improve Ability to identify and develop effective coping behaviors will improve Ability to maintain clinical measurements within normal limits will improve Compliance with prescribed medications will improve Ability to identify changes in lifestyle to reduce recurrence of condition will improve Ability to verbalize feelings will improve Ability to demonstrate self-control will improve Ability to identify and develop effective coping behaviors will improve Ability to maintain clinical measurements within normal limits will improve Compliance with prescribed medications will improve     Medication Management: Evaluate patient's response, side effects, and tolerance of medication regimen.  Therapeutic Interventions: 1 to 1 sessions, Unit Group sessions and Medication administration.  Evaluation of Outcomes: Not Met   RN Treatment Plan for Primary Diagnosis: <principal problem not specified> Long Term Goal(s): Knowledge of disease and therapeutic regimen to maintain health will improve  Short Term Goals: Ability to remain free from injury will improve, Ability to verbalize frustration and anger appropriately will improve, Ability to demonstrate self-control, Ability to identify and develop effective coping behaviors will improve and Compliance with prescribed medications will improve  Medication Management: RN will administer medications as ordered by provider, will assess and evaluate patient's response and provide education to patient for prescribed medication. RN will report any adverse and/or side effects to prescribing provider.  Therapeutic Interventions: 1 on 1 counseling sessions, Psychoeducation, Medication administration, Evaluate responses to treatment, Monitor vital signs and CBGs as ordered, Perform/monitor CIWA, COWS, AIMS and Fall Risk screenings as ordered, Perform wound care treatments as ordered.  Evaluation of Outcomes:  Not Met   LCSW Treatment Plan for Primary Diagnosis: <principal problem not specified> Long Term Goal(s): Safe transition to appropriate next level of care at discharge, Engage patient in therapeutic group addressing interpersonal concerns.  Short Term Goals: Engage patient in aftercare planning with referrals and resources, Increase social support, Increase ability to appropriately verbalize feelings, Identify triggers associated with mental health/substance abuse issues and Increase skills for wellness and recovery  Therapeutic Interventions: Assess for all discharge needs, 1 to 1 time with Social worker, Explore available resources and support systems, Assess for adequacy in community support network, Educate family and significant other(s) on suicide prevention, Complete Psychosocial Assessment, Interpersonal group therapy.  Evaluation of Outcomes: Not Met   Progress in Treatment: Attending groups: Yes. Participating in groups: Yes. Taking medication as prescribed: Yes. Toleration medication: Yes. Family/Significant other contact made: No, will contact:  if consent is given Patient understands diagnosis: Yes. Discussing patient identified problems/goals with staff: Yes. Medical problems stabilized or resolved: Yes. Denies suicidal/homicidal ideation: Yes. Issues/concerns per patient self-inventory: No.   New problem(s) identified: No, Describe:  none  New Short Term/Long Term Goal(s): medication stabilization, elimination of SI thoughts, development of comprehensive mental wellness plan.   Patient Goals:  "To get better"  Discharge Plan or Barriers: Patient recently admitted. CSW will continue to follow and assess for appropriate referrals and possible discharge planning.    Reason for Continuation of Hospitalization: Delusions  Hallucinations Medication stabilization  Estimated Length of Stay: 3-5 days  Attendees: Patient: Patricia [name] 10/09/2020   Physician:  Myles Lipps, [place] 10/09/2020   Nursing:  10/09/2020   RN  Care Manager: 10/09/2020  Social Worker: Darletta Moll, [place] 10/09/2020   Recreational Therapist:  10/09/2020   Other:  10/09/2020   Other:  10/09/2020   Other: 10/09/2020     Scribe for Treatment Team: Vassie Moselle, LCSW 10/09/2020 2:19 PM

## 2020-10-09 NOTE — [facility] Group Notes (Signed)
LCSW Group Therapy Note 10/09/2020 1:15pm  Type of Therapy and Topic:  Group Therapy:  Setting Goals  Participation Level:  Did Not Attend  Description of Group: In this process group, patients discussed using strengths to work toward goals and address challenges.  Patients identified two positive things about themselves and one goal they were working on.  Patients were given the opportunity to share openly and support each other's plan for self-empowerment.  The group discussed the value of gratitude and were encouraged to have a daily reflection of positive characteristics or circumstances.  Patients were encouraged to identify a plan to utilize their strengths to work on current challenges and goals.  Therapeutic Goals 1. Patient will verbalize personal strengths/positive qualities and relate how these can assist with achieving desired personal goals 2. Patients will verbalize affirmation of peers plans for personal change and goal setting 3. Patients will explore the value of gratitude and positive focus as related to successful achievement of goals 4. Patients will verbalize a plan for regular reinforcement of personal positive qualities and circumstances.  Summary of Patient Progress:   Patient initially came to group, however left before group began and did not return.     Therapeutic Modalities Cognitive Behavioral Therapy Motivational Interviewing    Otelia Santee, LCSW 10/09/2020 2:47 PM

## 2020-10-09 NOTE — Progress Notes (Signed)
[facility]  10/09/2020 11:59 AM Patricia [name]  MRN:  [id number] Subjective: Patient is a 50 year old female with a reported past psychiatric history significant for schizoaffective disorder; bipolar type who [facility] on 10/04/2020 reporting that she had become anorexic and was not eating.  Subsequently she was found to be psychotic and experienced psychotic symptoms.  Objective: Patient is seen and examined.  Patient is a 50 year old female with the above-stated past psychiatric history who is seen in follow-up.  She is curious as to why I am seeing her today.  She stated she saw me yesterday.  She continues to be psychotic.  She did sleep approximately 5.5 hours last night.  She believes that she has recurrent pneumonia, and that her thinking is off because it is full of "mucus".  She does not appear to be as labile as she was yesterday.  She denied any side effects from the Zyprexa.  Her vital signs are stable, she is afebrile.  She did sleep 5.5 hours last night.  Her lipid panel from this morning showed a mildly elevated cholesterol at 202.  The rest of the lipid panel was normal.  Her anemia panel revealed a mildly low iron saturation at 10%.  Her iron levels, TIBC and U IBC were all normal.  Ferritin was also low normal at 18.  Folic acid and vitamin B12 were both normal.  Her immature reticulocyte fraction was mildly elevated at 16.4.  Her reticulocyte count percentage was 1.2% which is in the normal range.  Her absolute reticulocyte count was 43.2 which is also normal.  TSH was normal at 1.258.  Hemoglobin A1c was normal at 5.5.  She denied any suicidal or homicidal ideation.  Principal Problem: <principal problem not specified> Diagnosis: Active Problems:   Schizoaffective disorder, bipolar type (HCC)   Bipolar disorder (HCC)  Total Time spent with patient: 20 minutes  Past Psychiatric History: See  admission H&P  Past Medical History:  Past Medical History:  Diagnosis Date  . Depression    History reviewed. No pertinent surgical history. Family History: History reviewed. No pertinent family history. Family Psychiatric  History: See admission H&P Social History:  Social History   Substance and Sexual Activity  Alcohol Use Yes  . Alcohol/week: 1.0 standard drink  . Types: 1 Glasses of wine per week   Comment: social      Social History   Substance and Sexual Activity  Drug Use No    Social History   Socioeconomic History  . Marital status: Divorced    Spouse name: Not on file  . Number of children: Not on file  . Years of education: Not on file  . Highest education level: Some college, no degree  Occupational History  . Not on file  Tobacco Use  . Smoking status: Current Some Day Smoker    Packs/day: 0.50    Years: 15.00    Pack years: 7.50    Types: Cigarettes, Cigars  . Smokeless tobacco: Never Used  Vaping Use  . Vaping Use: Never used  Substance and Sexual Activity  . Alcohol use: Yes    Alcohol/week: 1.0 standard drink    Types: 1 Glasses of wine per week    Comment: social   . Drug use: No  . Sexual activity: Not Currently  Other Topics Concern  . Not on file  Social History Narrative  . Not on file   Social Determinants of  Health   Financial Resource Strain: Not on file  Food Insecurity: Not on file  Transportation Needs: Not on file  Physical Activity: Not on file  Stress: Not on file  Social Connections: Not on file   Additional Social History:                         Sleep: Fair  Appetite:  Fair  Current Medications: Current Facility-Administered Medications  Medication Dose Route Frequency Provider Last Rate Last Admin  . acetaminophen (TYLENOL) tablet 650 mg  650 mg Oral Q6H PRN Antonieta Pert, MD   650 mg at 10/09/20 0800  . alum & mag hydroxide-simeth (MAALOX/MYLANTA) 200-200-20 MG/5ML suspension 30 mL  30 mL  Oral Q4H PRN Antonieta Pert, MD      . folic acid (FOLVITE) tablet 1 mg  1 mg Oral Daily Antonieta Pert, MD   1 mg at 10/09/20 0801  . hydrOXYzine (ATARAX/VISTARIL) tablet 25 mg  25 mg Oral TID PRN Antonieta Pert, MD      . OLANZapine zydis (ZYPREXA) disintegrating tablet 10 mg  10 mg Oral Q8H PRN Antonieta Pert, MD       And  . LORazepam (ATIVAN) tablet 1 mg  1 mg Oral Q6H PRN Antonieta Pert, MD       And  . ziprasidone (GEODON) injection 20 mg  20 mg Intramuscular Q6H PRN Antonieta Pert, MD      . magnesium hydroxide (MILK OF MAGNESIA) suspension 30 mL  30 mL Oral Daily PRN Antonieta Pert, MD      . OLANZapine zydis (ZYPREXA) disintegrating tablet 10 mg  10 mg Oral Daily Antonieta Pert, MD   10 mg at 10/09/20 0801  . OLANZapine zydis (ZYPREXA) disintegrating tablet 20 mg  20 mg Oral QHS Antonieta Pert, MD      . thiamine tablet 100 mg  100 mg Oral Daily Antonieta Pert, MD   100 mg at 10/09/20 0800  . traZODone (DESYREL) tablet 50 mg  50 mg Oral QHS PRN Antonieta Pert, MD        Lab Results:  Results for orders placed or performed during the hospital encounter of 10/07/20 (from the past 48 hour(s))  Vitamin B12     Status: None   Collection Time: 10/09/20  6:23 AM  Result Value Ref Range   Vitamin B-12 582 180 - 914 pg/mL    Comment: (NOTE) This assay is not validated for testing neonatal or myeloproliferative syndrome specimens for Vitamin B12 levels. Performed at [facility], [address] [place], [place] [address]   Folate     Status: None   Collection Time: 10/09/20  6:23 AM  Result Value Ref Range   Folate 14.0 >5.9 ng/mL    Comment: Performed at [facility], [address], [place], [place] [address]  Iron and TIBC     Status: Abnormal   Collection Time: 10/09/20  6:23 AM  Result Value Ref Range   Iron 41 28 - 170 ug/dL   TIBC 761 607 - 371 ug/dL   Saturation Ratios 10 (L) 10.4 - 31.8 %    UIBC 354 ug/dL    Comment: Performed at [facility], [address], [place], [place] [address]  Ferritin     Status: None   Collection Time: 10/09/20  6:23 AM  Result Value Ref Range   Ferritin 18  11 - 307 ng/mL    Comment: Performed at [facility], [address], [place], [place] [address]  TSH     Status: None   Collection Time: 10/09/20  6:23 AM  Result Value Ref Range   TSH 1.258 0.350 - 4.500 uIU/mL    Comment: Performed by a 3rd Generation assay with a functional sensitivity of <=0.01 uIU/mL. Performed at [facility], [address] [place], [place] [address]   Reticulocytes     Status: Abnormal   Collection Time: 10/09/20  6:23 AM  Result Value Ref Range   Retic Ct Pct 1.2 0.4 - 3.1 %   RBC. 3.72 (L) 3.87 - 5.11 MIL/uL   Retic Count, Absolute 43.2 19.0 - 186.0 K/uL   Immature Retic Fract 16.4 (H) 2.3 - 15.9 %    Comment: Performed at [facility], [address], [place], [place] [address]  Hemoglobin A1c     Status: None   Collection Time: 10/09/20  6:23 AM  Result Value Ref Range   Hgb A1c MFr Bld 5.5 4.8 - 5.6 %    Comment: (NOTE) Pre diabetes:          5.7%-6.4%  Diabetes:              >6.4%  Glycemic control for   <7.0% adults with diabetes    Mean Plasma Glucose 111.15 mg/dL    Comment: Performed at [facility], [address], [place], [place] [address]  Lipid panel     Status: Abnormal   Collection Time: 10/09/20  6:23 AM  Result Value Ref Range   Cholesterol 202 (H) 0 - 200 mg/dL   Triglycerides 132 <440 mg/dL   HDL 52 >10 mg/dL   Total CHOL/HDL Ratio 3.9 RATIO   VLDL 22 0 - 40 mg/dL   LDL Cholesterol 272 (H) 0 - 99 mg/dL    Comment:        Total Cholesterol/HDL:CHD Risk Coronary Heart Disease Risk Table                     Men   Women  1/2 Average Risk   3.4   3.3  Average Risk       5.0   4.4  2 X Average Risk   9.6   7.1  3 X Average Risk  23.4   11.0         Use the calculated Patient Ratio above and the CHD Risk Table to determine the patient's CHD Risk.        ATP III CLASSIFICATION (LDL):  <100     mg/dL   Optimal  536-644  mg/dL   Near or Above                    Optimal  130-159  mg/dL   Borderline  034-742  mg/dL   High  >595     mg/dL   Very High Performed at [facility], [address] [place], [place] [address]     Blood Alcohol level:  Lab Results  Component Value Date   [facility] <10 10/04/2020   ETH <10 11/11/2019    Metabolic Disorder Labs: Lab Results  Component Value Date   HGBA1C 5.5 10/09/2020   MPG 111.15 10/09/2020   MPG 111.15 05/24/2018   No results found for: PROLACTIN Lab Results  Component Value Date   CHOL 202 (H) 10/09/2020  TRIG 108 10/09/2020   HDL 52 10/09/2020   CHOLHDL 3.9 10/09/2020   VLDL 22 10/09/2020   LDLCALC 128 (H) 10/09/2020    Physical Findings: AIMS: Facial and Oral Movements Muscles of Facial Expression: None, normal Lips and Perioral Area: None, normal Jaw: None, normal Tongue: None, normal,Extremity Movements Upper (arms, wrists, hands, fingers): None, normal Lower (legs, knees, ankles, toes): None, normal, Trunk Movements Neck, shoulders, hips: None, normal, Overall Severity Severity of abnormal movements (highest score from questions above): None, normal Incapacitation due to abnormal movements: None, normal Patient's awareness of abnormal movements (rate only patient's report): No Awareness, Dental Status Current problems with teeth and/or dentures?: No Does patient usually wear dentures?: No  CIWA:    COWS:     Musculoskeletal: Strength & Muscle Tone: within normal limits Gait & Station: normal Patient leans: N/A  Psychiatric Specialty Exam: Physical Exam Vitals and nursing note reviewed.  HENT:     Head: Normocephalic and atraumatic.  Pulmonary:     Effort: Pulmonary effort is normal.  Neurological:     General: No focal deficit  present.     Mental Status: She is alert and oriented to person, place, and time.     Review of Systems  Blood pressure 116/73, pulse 67, temperature 98.5 F (36.9 C), temperature source Oral, resp. rate 20, height 5' 3.5" (1.613 m), weight 72.6 kg, SpO2 100 %.Body mass index is 27.9 kg/m.  General Appearance: Disheveled  Eye Contact:  Fair  Speech:  Pressured  Volume:  Normal  Mood:  Anxious, Dysphoric and Irritable  Affect:  Congruent  Thought Process:  Disorganized and Descriptions of Associations: Tangential  Orientation:  Negative  Thought Content:  Delusions, Paranoid Ideation, Rumination and Tangential  Suicidal Thoughts:  No  Homicidal Thoughts:  No  Memory:  Immediate;   Fair Recent;   Fair Remote;   Fair  Judgement:  Impaired  Insight:  Fair  Psychomotor Activity:  Increased  Concentration:  Concentration: Fair and Attention Span: Fair  Recall:  [employer] of Knowledge:  Fair  Language:  Good  Akathisia:  Negative  Handed:  Right  AIMS (if indicated):     Assets:  Desire for Improvement Housing Resilience Social Support  ADL's:  Intact  Cognition:  WNL  Sleep:  Number of Hours: 5.5     Treatment Plan Summary: Daily contact with patient to assess and evaluate symptoms and progress in treatment, Medication management and Plan : Patient is seen and examined.  Patient is a 50 year old female with the above-stated past psychiatric history who is seen in follow-up.   Diagnosis: 1.  Schizoaffective disorder; bipolar type 2.  Mild iron deficiency anemia most likely secondary to nutritional deficit. 3.  Biatrial enlargement.  Pertinent findings on examination today: 1.  Patient remains mildly disorganized, ruminating about recurrent pneumonias and "mucus" being in her brain. 2.  Sleep is mildly improved. 3.  Still with agitation periodically. 4.  No suicidal or homicidal ideation.  Plan: 1.  Continue folic acid 1 mg p.o. daily for nutritional  supplementation. 2.  Continue hydroxyzine 25 mg p.o. 3 times daily as needed anxiety. 3.  Continue Zyprexa agitation protocol as needed. 4.  Increase Zyprexa to 10 mg p.o. daily and 20 mg p.o. nightly for mood stability and psychosis. 5.  Continue thiamine 100 mg p.o. daily for nutritional supplementation. 6.  Add a multivitamin 1 tablet p.o. daily for nutritional supplementation. 7.  Continue trazodone 50 mg p.o. nightly as needed insomnia.  8.  Disposition planning-in progress.  Antonieta Pert, MD 10/09/2020, 11:59 AM

## 2020-10-09 NOTE — [facility] Counselor (Signed)
CSW attempted to complete PSA, however pt reported having a headache and requested to meet at another time.    Ruthann Cancer MSW, LCSW Clincal Social Worker  [facility]

## 2020-10-10 NOTE — Progress Notes (Signed)
Recreation Therapy Notes  Date: 10/10/2020 Time: 10:00a Location: [address] Day[address]                                                  Group Topic/Focus: Emotional Expression    Goal Area(s) Addresses:  Patient will be able to identify a variety of emotions.. Patient will successfully share benefits of healthy expression of emotions. Patient will successfully follow instructions on 1st prompt.      Behavioral Response: Engaged, Appropriate   Intervention: Artistic Self-expression   Activity: Emotions in Color. Patient provided a simplistic emotions worksheet, displaying 16 blank squares with varying emotions labeled beside them (for example happiness, sadness, anxiety, love and pride). Patient was asked to identify a color they felt represented each emotion listed. Patient was then provided the 'Emotions Within Me' worksheet, that has a large blank heart on it. Patients were asked to fill in the heart shaped worksheet in the colors identifies on the emotions worksheet to represent the colors within them. Patients were given colored pencils and markers to complete the assignment. Patients were given the opportunity to share their completed assignment with each other. Patients were debriefed on the concept of having accurate words to described their emotions, and knowing what makes them feel a certain way so they can communicate effectively with others.   Education: Tax inspector, Communication, Discharge planning   Education Outcome: Acknowledges understanding  Clinical Observations/Feedback: Pt was pleasant and interactive during session. Pt thoughtfully selected colors to represent listed emotions and shared choices and reasoning with group as the activity progressed. Pt listened to others share during discussion and related to their comments, offering support and encouragement. Pt willing to present their artwork sharing prominent emotions they experience as "happiness, skepticism,  and optimism."    Ilsa Iha, LRT/CTRS Benito Mccreedy Rolena Knutson 10/10/2020, 4:04 PM

## 2020-10-10 NOTE — Progress Notes (Signed)
   10/10/20 2200  Psych Admission Type (Psych Patients Only)  Admission Status Voluntary  Psychosocial Assessment  Patient Complaints Anxiety;Suspiciousness  Eye Contact Fair  Facial Expression Anxious;Pensive  Affect Anxious;Preoccupied  Furniture conservator/restorer  Appearance/Hygiene Unremarkable  Behavior Characteristics Anxious  Mood Suspicious;Preoccupied  Thought Process  Coherency Disorganized  Content Confabulation;Delusions;Paranoia  Delusions Grandeur;Paranoid  Perception Hallucinations  Hallucination Visual  Judgment Poor  Confusion Mild  Danger to Self  Current suicidal ideation? Denies  Danger to Others  Danger to Others None reported or observed

## 2020-10-10 NOTE — Progress Notes (Signed)
[facility] MD Progress Note  10/10/2020 2:26 PM Patricia [name]  MRN:  [id number] Subjective:  Patricia [name] reports, "I do not get agitated easily normally."  Patient presents with tangential conversation, apparent loose associations.    Objective: Patient is a 50 year old female with a past psychiatric history significant for schizoaffective disorder, bipolar type who originally presented to the [facility] emergency department on 10/04/2020, presented with psychotic symptoms.     Evaluation on the unit:  Patricia [name] is seen, chart reviewed and case discussed with the treatment team.  Per unit staff patient is more cooperative today.  Patient pleasant and cooperative during my assessment.  She is calm and cooperative,  alert & oriented.  Patient is in her room upon my approach.   Patient reports "my mood is even."  Patient reports rating her anxiety on a scale of 1-10 with 10 being the most anxious has 6.  Patient states "I feel nervous sometimes."   Patient does not appear to have insight as to why she remains at behavioral health Hospital.   Patient reports average appetite and good sleep. She denies any SI/HI, AVH.  Patient continues to have apparent delusions states "I have 12 adult children and they are with a childcare provider.   Patient also presents with mild somatic complaints states the pain in her "spine and bones has improved since yesterday."  She does not appear to be responding to any internal stimuli. She is in agreement to continue current plan of care as already in progress, no new medications or  medication changes today.  Encouragement and support provided.      Principal Problem: <principal problem not specified> Diagnosis: Active Problems:   Schizoaffective disorder, bipolar type (HCC)   Bipolar disorder (HCC)  Total Time spent with patient: 30 minutes  Past Psychiatric History: Schizoaffective disorder, bipolar type, bipolar disorder, depression, suicidal  ideation, major depressive disorder, schizophrenia  Past Medical History:  Past Medical History:  Diagnosis Date  . Depression    History reviewed. No pertinent surgical history. Family History: History reviewed. No pertinent family history. Family Psychiatric  History: None reported Social History:  Social History   Substance and Sexual Activity  Alcohol Use Yes  . Alcohol/week: 1.0 standard drink  . Types: 1 Glasses of wine per week   Comment: social      Social History   Substance and Sexual Activity  Drug Use No    Social History   Socioeconomic History  . Marital status: Divorced    Spouse name: Not on file  . Number of children: Not on file  . Years of education: Not on file  . Highest education level: Some college, no degree  Occupational History  . Not on file  Tobacco Use  . Smoking status: Current Some Day Smoker    Packs/day: 0.50    Years: 15.00    Pack years: 7.50    Types: Cigarettes, Cigars  . Smokeless tobacco: Never Used  Vaping Use  . Vaping Use: Never used  Substance and Sexual Activity  . Alcohol use: Yes    Alcohol/week: 1.0 standard drink    Types: 1 Glasses of wine per week    Comment: social   . Drug use: No  . Sexual activity: Not Currently  Other Topics Concern  . Not on file  Social History Narrative  . Not on file   Social Determinants of Health   Financial Resource Strain: Not on file  Food Insecurity: Not on file  Transportation Needs: Not on file  Physical Activity: Not on file  Stress: Not on file  Social Connections: Not on file   Additional Social History:                         Sleep: Fair  Appetite:  Good  Current Medications: Current Facility-Administered Medications  Medication Dose Route Frequency Provider Last Rate Last Admin  . acetaminophen (TYLENOL) tablet 650 mg  650 mg Oral Q6H PRN Antonieta Pert, MD   650 mg at 10/10/20 0757  . alum & mag hydroxide-simeth (MAALOX/MYLANTA) 200-200-20  MG/5ML suspension 30 mL  30 mL Oral Q4H PRN Antonieta Pert, MD      . hydrOXYzine (ATARAX/VISTARIL) tablet 25 mg  25 mg Oral TID PRN Antonieta Pert, MD      . OLANZapine zydis (ZYPREXA) disintegrating tablet 10 mg  10 mg Oral Q8H PRN Antonieta Pert, MD       And  . LORazepam (ATIVAN) tablet 1 mg  1 mg Oral Q6H PRN Antonieta Pert, MD       And  . ziprasidone (GEODON) injection 20 mg  20 mg Intramuscular Q6H PRN Antonieta Pert, MD      . magnesium hydroxide (MILK OF MAGNESIA) suspension 30 mL  30 mL Oral Daily PRN Antonieta Pert, MD      . multivitamins with iron tablet 1 tablet  1 tablet Oral Daily Antonieta Pert, MD   1 tablet at 10/10/20 0757  . OLANZapine zydis (ZYPREXA) disintegrating tablet 10 mg  10 mg Oral Daily Antonieta Pert, MD   10 mg at 10/10/20 0757  . OLANZapine zydis (ZYPREXA) disintegrating tablet 20 mg  20 mg Oral QHS Antonieta Pert, MD   20 mg at 10/09/20 2116  . traZODone (DESYREL) tablet 50 mg  50 mg Oral QHS PRN Antonieta Pert, MD        Lab Results:  Results for orders placed or performed during the hospital encounter of 10/07/20 (from the past 48 hour(s))  Vitamin B12     Status: None   Collection Time: 10/09/20  6:23 AM  Result Value Ref Range   Vitamin B-12 582 180 - 914 pg/mL    Comment: (NOTE) This assay is not validated for testing neonatal or myeloproliferative syndrome specimens for Vitamin B12 levels. Performed at [facility], [address] [place], [place] [address]   Folate     Status: None   Collection Time: 10/09/20  6:23 AM  Result Value Ref Range   Folate 14.0 >5.9 ng/mL    Comment: Performed at [facility], [address] [place], [place] [address]  Iron and TIBC     Status: Abnormal   Collection Time: 10/09/20  6:23 AM  Result Value Ref Range   Iron 41 28 - 170 ug/dL   TIBC 098 119 - 147 ug/dL   Saturation Ratios 10 (L) 10.4 - 31.8 %   UIBC 354 ug/dL     Comment: Performed at [facility], [address] [place], [place] [address]  Ferritin     Status: None   Collection Time: 10/09/20  6:23 AM  Result Value Ref Range   Ferritin 18 11 - 307 ng/mL    Comment: Performed at [facility], [address], [place], [place] [address]  TSH     Status: None   Collection Time: 10/09/20  6:23 AM  Result Value Ref Range   TSH 1.258 0.350 - 4.500 uIU/mL    Comment: Performed by a 3rd Generation assay with a functional sensitivity of <=0.01 uIU/mL. Performed at [facility], [address] [place], [place] [address]   Reticulocytes     Status: Abnormal   Collection Time: 10/09/20  6:23 AM  Result Value Ref Range   Retic Ct Pct 1.2 0.4 - 3.1 %   RBC. 3.72 (L) 3.87 - 5.11 MIL/uL   Retic Count, Absolute 43.2 19.0 - 186.0 K/uL   Immature Retic Fract 16.4 (H) 2.3 - 15.9 %    Comment: Performed at [facility], [address] [place], [place] [address]  Hemoglobin A1c     Status: None   Collection Time: 10/09/20  6:23 AM  Result Value Ref Range   Hgb A1c MFr Bld 5.5 4.8 - 5.6 %    Comment: (NOTE) Pre diabetes:          5.7%-6.4%  Diabetes:              >6.4%  Glycemic control for   <7.0% adults with diabetes    Mean Plasma Glucose 111.15 mg/dL    Comment: Performed at [facility], [address] [place], [place] [address]  Lipid panel     Status: Abnormal   Collection Time: 10/09/20  6:23 AM  Result Value Ref Range   Cholesterol 202 (H) 0 - 200 mg/dL   Triglycerides 914108 <782<150 mg/dL   HDL 52 >95>40 mg/dL   Total CHOL/HDL Ratio 3.9 RATIO   VLDL 22 0 - 40 mg/dL   LDL Cholesterol 621128 (H) 0 - 99 mg/dL    Comment:        Total Cholesterol/HDL:CHD Risk Coronary Heart Disease Risk Table                     Men   Women  1/2 Average Risk   3.4   3.3  Average Risk       5.0   4.4  2 X Average Risk   9.6   7.1  3 X Average Risk  23.4   11.0        Use the  calculated Patient Ratio above and the CHD Risk Table to determine the patient's CHD Risk.        ATP III CLASSIFICATION (LDL):  <100     mg/dL   Optimal  [id number]  mg/dL   Near or Above                    Optimal  130-159  mg/dL   Borderline  [id number]  mg/dL   High  >952>190     mg/dL   Very High Performed at [facility], [address] [place], [place] [address]     Blood Alcohol level:  Lab Results  Component Value Date   [facility] <10 10/04/2020   ETH <10 11/11/2019    Metabolic Disorder Labs: Lab Results  Component Value Date   HGBA1C 5.5 10/09/2020   MPG 111.15 10/09/2020   MPG 111.15 05/24/2018   No results found for: PROLACTIN Lab Results  Component Value Date   CHOL 202 (H) 10/09/2020   TRIG 108 10/09/2020   HDL 52 10/09/2020   CHOLHDL 3.9 10/09/2020   VLDL 22 10/09/2020   LDLCALC 128 (H) 10/09/2020    Physical Findings: AIMS: Facial and Oral Movements Muscles  of Facial Expression: None, normal Lips and Perioral Area: None, normal Jaw: None, normal Tongue: None, normal,Extremity Movements Upper (arms, wrists, hands, fingers): None, normal Lower (legs, knees, ankles, toes): None, normal, Trunk Movements Neck, shoulders, hips: None, normal, Overall Severity Severity of abnormal movements (highest score from questions above): None, normal Incapacitation due to abnormal movements: None, normal Patient's awareness of abnormal movements (rate only patient's report): No Awareness, Dental Status Current problems with teeth and/or dentures?: No Does patient usually wear dentures?: No  CIWA:    COWS:     Musculoskeletal: Strength & Muscle Tone: within normal limits Gait & Station: normal Patient leans: N/A  Psychiatric Specialty Exam: Physical Exam Vitals and nursing note reviewed.  Constitutional:      Appearance: She is well-developed.  HENT:     Head: Normocephalic.  Cardiovascular:     Rate and Rhythm: Normal rate.  Pulmonary:      Effort: Pulmonary effort is normal.  Neurological:     Mental Status: She is alert and oriented to person, place, and time.  Psychiatric:        Attention and Perception: She is inattentive.        Mood and Affect: Mood is anxious. Affect is blunt.        Speech: Speech is tangential.        Behavior: Behavior is cooperative.        Thought Content: Thought content is delusional.     Review of Systems  Constitutional: Negative.   HENT: Negative.   Eyes: Negative.   Respiratory: Negative.   Cardiovascular: Negative.   Gastrointestinal: Negative.   Genitourinary: Negative.   Musculoskeletal: Negative.   Skin: Negative.   Neurological: Negative.   Psychiatric/Behavioral: Positive for decreased concentration. The patient is nervous/anxious.     Blood pressure 122/89, pulse 90, temperature (!) 97.3 F (36.3 C), temperature source Oral, resp. rate 18, height 5' 3.5" (1.613 m), weight 72.6 kg, SpO2 97 %.Body mass index is 27.9 kg/m.  General Appearance: Casual  Eye Contact:  Fair  Speech:  Clear and Coherent  Volume:  Increased  Mood:  Anxious and Dysphoric  Affect:  Blunt  Thought Process:  Coherent and Descriptions of Associations: Tangential  Orientation:  Full (Time, Place, and Person)  Thought Content:  Delusions and Tangential  Suicidal Thoughts:  No  Homicidal Thoughts:  No  Memory:  Immediate;   Fair Recent;   Poor Remote;   Poor  Judgement:  Impaired  Insight:  Lacking  Psychomotor Activity:  Normal  Concentration:  Concentration: Fair and Attention Span: Fair  Recall:  [employer] of Knowledge:  Fair  Language:  Fair  Akathisia:  No  Handed:  Right  AIMS (if indicated):     Assets:  Communication Skills Desire for Improvement Physical Health Resilience  ADL's:  Intact  Cognition:  WNL  Sleep:  Number of Hours: 4.75     Treatment Plan Summary: Patient reviewed with Dr. Lucianne Muss. Daily contact with patient to assess and evaluate symptoms and progress in  treatment and Medication management   Continue inpatient hospitalization. Will continue today 10/10/2020 plan as below except where it is noted.  Mood. Continue olanzapine Zydis 10 mg daily 8 AM Continue olanzapine Zydis 20 mg daily nightly   Anxiety. Continue Vistaril  3 times daily as needed Continue Lorazepam 1 mg po Q 6 hrs prn for CIWA > 10.   Insomnia. Continue Trazodone 50 mg po Q hs prn.   Other prn medications:  Continue: Tylenol 650 mg po Q 6 hrs prn for pain/fever. Mylanta 30 ml po Q 4 hrs prn for indigestion. Continue MOM 30 ml po daily prn for constipation.  Continue olanzapine Zydis 10 mg every 8 as needed/agitation Continue Ativan 1 mg every 6 as needed/severe agitation Continue ziprasidone 20 mg IM every 6 agitation  Continue multivitamin with iron daily.  Encourage group participation. Maintain 15 minute safety checks Discharge disposition in progress.      Patrcia Dolly, FNP 10/10/2020, 2:26 PM

## 2020-10-10 NOTE — Progress Notes (Signed)
Adult Psychoeducational Group Note  Date:  10/10/2020 Time:  10:22 PM  Group Topic/Focus:  Wrap-Up Group:   The focus of this group is to help patients review their daily goal of treatment and discuss progress on daily workbooks.  Participation Level:  Did Not Attend  Participation Quality:  Did Not Attend  Affect:  Did Not Attend  Cognitive:  Did Not Attend  Insight: None  Engagement in Group:  Did Not Attend  Modes of Intervention:  Did Not Attend  Additional Comments:  Pt did not attend evening wrap up group tonight.  Felipa Furnace 10/10/2020, 10:22 PM

## 2020-10-10 NOTE — Plan of Care (Signed)
°  Problem: Clinical Measurements: °Goal: Will remain free from infection °Outcome: Progressing °  °Problem: Activity: °Goal: Risk for activity intolerance will decrease °Outcome: Progressing °  °Problem: Nutrition: °Goal: Adequate nutrition will be maintained °Outcome: Progressing °  °

## 2020-10-10 NOTE — Progress Notes (Signed)
Progress note    10/10/20 0757  Psych Admission Type (Psych Patients Only)  Admission Status Voluntary  Psychosocial Assessment  Patient Complaints Anxiety  Eye Contact Fair  Facial Expression Anxious;Pensive  Affect Anxious;Preoccupied  Furniture conservator/restorer  Appearance/Hygiene Unremarkable  Behavior Characteristics Cooperative;Appropriate to situation;Anxious  Mood Anxious;Preoccupied;Pleasant  Thought Process  Coherency Disorganized  Content Confabulation;Delusions;Paranoia  Delusions Grandeur;Paranoid  Perception Hallucinations  Hallucination Visual  Judgment Poor  Confusion Mild  Danger to Self  Current suicidal ideation? Denies  Danger to Others  Danger to Others None reported or observed

## 2020-10-11 MED ORDER — ATORVASTATIN CALCIUM 10 MG PO TABS
10.0000 mg | ORAL_TABLET | Freq: Every day | ORAL | Status: DC
  Administered 2020-10-11 – 2020-10-13 (×3): 10 mg via ORAL
  Filled 2020-10-11: qty 7
  Filled 2020-10-11: qty 1
  Filled 2020-10-11: qty 7
  Filled 2020-10-11 (×4): qty 1

## 2020-10-11 MED ORDER — CARBAMAZEPINE 100 MG PO CHEW
100.0000 mg | CHEWABLE_TABLET | Freq: Two times a day (BID) | ORAL | Status: DC
  Administered 2020-10-11 – 2020-10-12 (×2): 100 mg via ORAL
  Filled 2020-10-11 (×5): qty 1

## 2020-10-11 NOTE — [facility] Group Notes (Signed)
[facility] LCSW Group Therapy  10/11/2020 2:17 PM  Type of Therapy:  Movement Therapy  Participation Level:  Did Not Attend  Summary of Progress/Problems: Did not attend.  Sukhmani Fetherolf A Braylee Lal 10/11/2020, 2:17 PM

## 2020-10-11 NOTE — Progress Notes (Signed)
   10/11/20 0500  Sleep  Number of Hours 8.25

## 2020-10-11 NOTE — Progress Notes (Signed)
Recreation Therapy Notes  Date: 10/11/2020 Time: 1005a Location: [address] Hall Dayroom  Group Topic: Leisure Education   Goal Area(s) Addresses:  Patient will successfully identify positive leisure and recreation activities.  Patient will acknowlege benefits of participation in healthy leisure activities post discharge.  Patient will actively work with peers toward a shared goal.   Behavioral Response: Active, Appropriate   Intervention: Competitive Group Game   Activity: Leisure Facilities manager. In teams of 2, patients were asked to create a list of leisure activities to correspond with a letter of the alphabet selected by LRT. Time limit of 1 minute and 30 seconds per round. Points were awarded for each unique answer identified by a team. After several rounds of game play, using different letters, the team with the most points were declared winners.    Education:  Leisure Education, Pharmacologist, Discharge Planning  Education Outcome: Acknowledges education  Clinical Observations/Feedback: Pt was cooperative and on-task throughout game play. Pt successfully recognized healthy leisure outlets during each round. Pt worked well with partner to give suggestions and record answers. Pt expressed appreciation for the activity, stating "it gave me more ideas of hobbies to try".    Ilsa Iha, LRT/CTRS Benito Mccreedy Girard Koontz 10/11/2020, 2:37 PM

## 2020-10-11 NOTE — Progress Notes (Signed)
Pt remains preoccupied, observed pacing in room responding to internal stimuli, talking to self with inappropriate laughter when interrupted by Clinical research associate for group. Continues to endorse AVH "It's not that bad". Speech is soft with appropriate responses, eye contact is brief but fair on interactions. Confirms she slept well last night and appetite is good. Denies SI, HI and  Attended scheduled groups in dayroom, interacted well with peers. Remains medication complaint. Denies adverse drug reactions when assessed. Rates her depression 5/10, hopelessness 3/10 and anxiety 7/10 on self inventory. Verbalized stressor "Just want to go home. I don't want to take nothing for anxiety right now, I want to stay up". Safety checks maintained at Q 15 minutes intervals without self harm gestures. All medications given with verbal education and effects monitored. Support, reassurance and encouragement provided to pt throughout this shift.  Pt remains safe on and off unit. Tolerates her medications and meals well. Denies concerns at this time.

## 2020-10-11 NOTE — Progress Notes (Signed)
Pt had minimal interaction on the unit his evening, pt continues to respond to internal stimuli by being seen talking to people not seen by staff    10/11/20 2100  Psych Admission Type (Psych Patients Only)  Admission Status Voluntary  Psychosocial Assessment  Patient Complaints Suspiciousness  Eye Contact Fair  Facial Expression Anxious;Pensive  Affect Anxious;Preoccupied  Furniture conservator/restorer  Appearance/Hygiene Unremarkable  Behavior Characteristics Anxious  Mood Suspicious;Preoccupied  Aggressive Behavior  Effect No apparent injury  Thought Process  Coherency Disorganized  Content Confabulation;Delusions;Paranoia  Delusions Grandeur;Paranoid  Perception Hallucinations  Hallucination Visual;Auditory (Observed talking to self, pacing in her room)  Judgment Poor  Confusion Mild  Danger to Self  Current suicidal ideation? Denies  Danger to Others  Danger to Others None reported or observed

## 2020-10-11 NOTE — Progress Notes (Signed)
[facility] MD Progress Note  10/11/2020 11:09 AM JERSI MCMASTER  MRN:  [id number] Subjective:  Patient is a 50 year old female with a reported past psychiatric history significant for schizoaffective disorder; bipolar type who originally presented to the [facility] emergency department on 10/04/2020 reporting that she had become anorexic and was not eating.  Subsequently she was found to be psychotic and experienced psychotic symptoms.  Objective: Patient is seen and examined.  Patient is a 50 year old female with the above-stated past psychiatric history who is seen in follow-up.  She seems less irritable than when I saw her on 2/28.  Her Zyprexa dose was not changed yesterday and remains at 10 mg p.o. daily and 20 mg p.o. nightly.  Her vital signs are stable, she is afebrile.  She will he slept 8.25 hours last night.  Pulse oximetry on room air was 100%.  Lipid panel from 2/28 showed a total cholesterol of 202, LDL of 128.  TSH from 2/28 was 1.250.  Her disorganization has decreased a bit, she still mildly delusional.  Sleep is significantly improved.  Principal Problem: <principal problem not specified> Diagnosis: Active Problems:   Schizoaffective disorder, bipolar type (HCC)   Bipolar disorder (HCC)  Total Time spent with patient: 20 minutes  Past Psychiatric History: See admission H&P  Past Medical History:  Past Medical History:  Diagnosis Date  . Depression    History reviewed. No pertinent surgical history. Family History: History reviewed. No pertinent family history. Family Psychiatric  History: See admission H&P Social History:  Social History   Substance and Sexual Activity  Alcohol Use Yes  . Alcohol/week: 1.0 standard drink  . Types: 1 Glasses of wine per week   Comment: social      Social History   Substance and Sexual Activity  Drug Use No    Social History   Socioeconomic History  . Marital status: Divorced    Spouse name: Not on file  .  Number of children: Not on file  . Years of education: Not on file  . Highest education level: Some college, no degree  Occupational History  . Not on file  Tobacco Use  . Smoking status: Current Some Day Smoker    Packs/day: 0.50    Years: 15.00    Pack years: 7.50    Types: Cigarettes, Cigars  . Smokeless tobacco: Never Used  Vaping Use  . Vaping Use: Never used  Substance and Sexual Activity  . Alcohol use: Yes    Alcohol/week: 1.0 standard drink    Types: 1 Glasses of wine per week    Comment: social   . Drug use: No  . Sexual activity: Not Currently  Other Topics Concern  . Not on file  Social History Narrative  . Not on file   Social Determinants of Health   Financial Resource Strain: Not on file  Food Insecurity: Not on file  Transportation Needs: Not on file  Physical Activity: Not on file  Stress: Not on file  Social Connections: Not on file   Additional Social History:                         Sleep: Good  Appetite:  Good  Current Medications: Current Facility-Administered Medications  Medication Dose Route Frequency Provider Last Rate Last Admin  . acetaminophen (TYLENOL) tablet 650 mg  650 mg Oral Q6H PRN Antonieta Pert, MD   650 mg at 10/10/20 0757  . alum &  mag hydroxide-simeth (MAALOX/MYLANTA) 200-200-20 MG/5ML suspension 30 mL  30 mL Oral Q4H PRN Antonieta Pert, MD      . hydrOXYzine (ATARAX/VISTARIL) tablet 25 mg  25 mg Oral TID PRN Antonieta Pert, MD      . OLANZapine zydis (ZYPREXA) disintegrating tablet 10 mg  10 mg Oral Q8H PRN Antonieta Pert, MD       And  . LORazepam (ATIVAN) tablet 1 mg  1 mg Oral Q6H PRN Antonieta Pert, MD       And  . ziprasidone (GEODON) injection 20 mg  20 mg Intramuscular Q6H PRN Antonieta Pert, MD      . magnesium hydroxide (MILK OF MAGNESIA) suspension 30 mL  30 mL Oral Daily PRN Antonieta Pert, MD      . multivitamins with iron tablet 1 tablet  1 tablet Oral Daily Antonieta Pert, MD   1 tablet at 10/11/20 [phone]  . OLANZapine zydis (ZYPREXA) disintegrating tablet 10 mg  10 mg Oral Daily Antonieta Pert, MD   10 mg at 10/11/20 6283  . OLANZapine zydis (ZYPREXA) disintegrating tablet 20 mg  20 mg Oral QHS Antonieta Pert, MD   20 mg at 10/10/20 2119  . traZODone (DESYREL) tablet 50 mg  50 mg Oral QHS PRN Antonieta Pert, MD        Lab Results: No results found for this or any previous visit (from the past 48 hour(s)).  Blood Alcohol level:  Lab Results  Component Value Date   ETH <10 10/04/2020   ETH <10 11/11/2019    Metabolic Disorder Labs: Lab Results  Component Value Date   HGBA1C 5.5 10/09/2020   MPG 111.15 10/09/2020   MPG 111.15 05/24/2018   No results found for: PROLACTIN Lab Results  Component Value Date   CHOL 202 (H) 10/09/2020   TRIG 108 10/09/2020   HDL 52 10/09/2020   CHOLHDL 3.9 10/09/2020   VLDL 22 10/09/2020   LDLCALC 128 (H) 10/09/2020    Physical Findings: AIMS: Facial and Oral Movements Muscles of Facial Expression: None, normal Lips and Perioral Area: None, normal Jaw: None, normal Tongue: None, normal,Extremity Movements Upper (arms, wrists, hands, fingers): None, normal Lower (legs, knees, ankles, toes): None, normal, Trunk Movements Neck, shoulders, hips: None, normal, Overall Severity Severity of abnormal movements (highest score from questions above): None, normal Incapacitation due to abnormal movements: None, normal Patient's awareness of abnormal movements (rate only patient's report): No Awareness, Dental Status Current problems with teeth and/or dentures?: No Does patient usually wear dentures?: No  CIWA:    COWS:     Musculoskeletal: Strength & Muscle Tone: within normal limits Gait & Station: normal Patient leans: N/A  Psychiatric Specialty Exam: Physical Exam Vitals and nursing note reviewed.  HENT:     Head: Normocephalic and atraumatic.  Pulmonary:     Effort: Pulmonary effort is  normal.  Neurological:     General: No focal deficit present.     Mental Status: She is alert and oriented to person, place, and time.     Review of Systems  Blood pressure 126/79, pulse 92, temperature 98.2 F (36.8 C), temperature source Oral, resp. rate 18, height 5' 3.5" (1.613 m), weight 72.6 kg, SpO2 100 %.Body mass index is 27.9 kg/m.  General Appearance: Casual  Eye Contact:  Fair  Speech:  Normal Rate  Volume:  Normal  Mood:  Dysphoric  Affect:  Labile  Thought Process:  Disorganized and Descriptions  of Associations: Tangential  Orientation:  Negative  Thought Content:  Delusions and Rumination  Suicidal Thoughts:  No  Homicidal Thoughts:  No  Memory:  Immediate;   Fair Recent;   Fair Remote;   Fair  Judgement:  Intact  Insight:  Lacking  Psychomotor Activity:  Increased  Concentration:  Concentration: Fair and Attention Span: Fair  Recall:  [employer] of Knowledge:  Fair  Language:  Good  Akathisia:  Negative  Handed:  Right  AIMS (if indicated):     Assets:  Desire for Improvement Resilience  ADL's:  Intact  Cognition:  WNL  Sleep:  Number of Hours: 8.25     Treatment Plan Summary: Daily contact with patient to assess and evaluate symptoms and progress in treatment, Medication management and Plan : Patient is seen and examined.  Patient is a 50 year old female with the above-stated past psychiatric history who is seen in follow-up.  Diagnosis: 1.  Schizoaffective disorder; bipolar type 2.  Mild iron deficiency anemia most likely secondary to nutritional deficit. 3.  Biatrial enlargement. 4.  Hyperlipidemia  Pertinent findings on examination today: 1.  Patient's disorganization, rumination and somatic delusions are slightly improved. 2.  Sleep is improved. 3.  Agitation.  Seem to be decreasing. 4.  No suicidal or homicidal ideation.  Plan: 1.  Continue folic acid 1 mg p.o. daily for nutritional supplementation. 2.  Continue hydroxyzine 25 mg p.o. 3  times daily as needed anxiety. 3.  Continue Zyprexa agitation protocol as needed. 4.  Increase Zyprexa to 10 mg p.o. daily and 20 mg p.o. nightly for mood stability and psychosis. 5.  Continue thiamine 100 mg p.o. daily for nutritional supplementation. 6.  Add a multivitamin 1 tablet p.o. daily for nutritional supplementation. 7.  Continue trazodone 50 mg p.o. nightly as needed insomnia. 8.  Add Lipitor 10 mg p.o. daily for hyperlipidemia. 9.  We will attempt to add Tegretol 100 mg p.o. twice daily for mood stability. 10.  Disposition planning-in progress. Antonieta Pert, MD 10/11/2020, 11:09 AM

## 2020-10-11 NOTE — [facility] Group Notes (Signed)
The focus of this group is to help patients establish daily goals to achieve during treatment and discuss how the patient can incorporate goal setting into their daily lives to aide in recovery.  Pt was able to participate in group and discuss her goals and how to reach those goals.

## 2020-10-12 MED ORDER — CARBAMAZEPINE 100 MG PO CHEW
200.0000 mg | CHEWABLE_TABLET | Freq: Two times a day (BID) | ORAL | Status: DC
  Administered 2020-10-12 – 2020-10-13 (×2): 200 mg via ORAL
  Filled 2020-10-12: qty 2
  Filled 2020-10-12: qty 28
  Filled 2020-10-12: qty 2
  Filled 2020-10-12 (×2): qty 28
  Filled 2020-10-12 (×2): qty 2
  Filled 2020-10-12: qty 28

## 2020-10-12 NOTE — Progress Notes (Signed)
[facility] MD Progress Note  10/12/2020 11:26 AM Patricia [name]  MRN:  [id number] Subjective:  Patient is a 50 year old female with a reported past psychiatric history significant for schizoaffective disorder; bipolar type who originally presented to the [facility] emergency department on 10/04/2020 reporting that she had become anorexic and was not eating. Subsequently she was found to be psychotic and experienced psychotic symptoms.  Objective: Patient is seen and examined.  Patient is a 50 year old female with the above-stated past psychiatric history who is seen in follow-up.  She seems to be slowly improving.  She denied any auditory or visual hallucinations.  She denied any suicidal or homicidal ideation.  Her mood stability improved and her irritability decreased with the addition of the Tegretol 100 mg p.o. twice daily on 10/11/2020.  She denied any side effects of this medication.  Her blood pressures a little bit better today.  It is 124/55 and repeat was 138/78.  Pulse was between 78 and 89.  She is afebrile.  Pulse oximetry was between 99 and 100% on room air.  Her sleep improved to 7 hours last night.  No new laboratories.  Principal Problem: <principal problem not specified> Diagnosis: Active Problems:   Schizoaffective disorder, bipolar type (HCC)   Bipolar disorder (HCC)  Total Time spent with patient: 20 minutes  Past Psychiatric History: See admission H&P  Past Medical History:  Past Medical History:  Diagnosis Date  . Depression    History reviewed. No pertinent surgical history. Family History: History reviewed. No pertinent family history. Family Psychiatric  History: See admission H&P Social History:  Social History   Substance and Sexual Activity  Alcohol Use Yes  . Alcohol/week: 1.0 standard drink  . Types: 1 Glasses of wine per week   Comment: social      Social History   Substance and Sexual Activity  Drug Use No    Social History    Socioeconomic History  . Marital status: Divorced    Spouse name: Not on file  . Number of children: Not on file  . Years of education: Not on file  . Highest education level: Some college, no degree  Occupational History  . Not on file  Tobacco Use  . Smoking status: Current Some Day Smoker    Packs/day: 0.50    Years: 15.00    Pack years: 7.50    Types: Cigarettes, Cigars  . Smokeless tobacco: Never Used  Vaping Use  . Vaping Use: Never used  Substance and Sexual Activity  . Alcohol use: Yes    Alcohol/week: 1.0 standard drink    Types: 1 Glasses of wine per week    Comment: social   . Drug use: No  . Sexual activity: Not Currently  Other Topics Concern  . Not on file  Social History Narrative  . Not on file   Social Determinants of Health   Financial Resource Strain: Not on file  Food Insecurity: Not on file  Transportation Needs: Not on file  Physical Activity: Not on file  Stress: Not on file  Social Connections: Not on file   Additional Social History:                         Sleep: Good  Appetite:  Fair  Current Medications: Current Facility-Administered Medications  Medication Dose Route Frequency Provider Last Rate Last Admin  . acetaminophen (TYLENOL) tablet 650 mg  650 mg Oral Q6H PRN Antonieta Pert,  MD   650 mg at 10/10/20 0757  . alum & mag hydroxide-simeth (MAALOX/MYLANTA) 200-200-20 MG/5ML suspension 30 mL  30 mL Oral Q4H PRN Antonieta Pert, MD      . atorvastatin (LIPITOR) tablet 10 mg  10 mg Oral Daily Antonieta Pert, MD   10 mg at 10/12/20 0806  . carbamazepine (TEGRETOL) chewable tablet 100 mg  100 mg Oral BID Antonieta Pert, MD   100 mg at 10/12/20 1610  . hydrOXYzine (ATARAX/VISTARIL) tablet 25 mg  25 mg Oral TID PRN Antonieta Pert, MD      . OLANZapine zydis (ZYPREXA) disintegrating tablet 10 mg  10 mg Oral Q8H PRN Antonieta Pert, MD       And  . LORazepam (ATIVAN) tablet 1 mg  1 mg Oral Q6H PRN Antonieta Pert, MD       And  . ziprasidone (GEODON) injection 20 mg  20 mg Intramuscular Q6H PRN Antonieta Pert, MD      . magnesium hydroxide (MILK OF MAGNESIA) suspension 30 mL  30 mL Oral Daily PRN Antonieta Pert, MD      . multivitamins with iron tablet 1 tablet  1 tablet Oral Daily Antonieta Pert, MD   1 tablet at 10/12/20 [phone]  . OLANZapine zydis (ZYPREXA) disintegrating tablet 10 mg  10 mg Oral Daily Antonieta Pert, MD   10 mg at 10/12/20 0806  . OLANZapine zydis (ZYPREXA) disintegrating tablet 20 mg  20 mg Oral QHS Antonieta Pert, MD   20 mg at 10/11/20 2046  . traZODone (DESYREL) tablet 50 mg  50 mg Oral QHS PRN Antonieta Pert, MD        Lab Results: No results found for this or any previous visit (from the past 48 hour(s)).  Blood Alcohol level:  Lab Results  Component Value Date   ETH <10 10/04/2020   ETH <10 11/11/2019    Metabolic Disorder Labs: Lab Results  Component Value Date   HGBA1C 5.5 10/09/2020   MPG 111.15 10/09/2020   MPG 111.15 05/24/2018   No results found for: PROLACTIN Lab Results  Component Value Date   CHOL 202 (H) 10/09/2020   TRIG 108 10/09/2020   HDL 52 10/09/2020   CHOLHDL 3.9 10/09/2020   VLDL 22 10/09/2020   LDLCALC 128 (H) 10/09/2020    Physical Findings: AIMS: Facial and Oral Movements Muscles of Facial Expression: None, normal Lips and Perioral Area: None, normal Jaw: None, normal Tongue: None, normal,Extremity Movements Upper (arms, wrists, hands, fingers): None, normal Lower (legs, knees, ankles, toes): None, normal, Trunk Movements Neck, shoulders, hips: None, normal, Overall Severity Severity of abnormal movements (highest score from questions above): None, normal Incapacitation due to abnormal movements: None, normal Patient's awareness of abnormal movements (rate only patient's report): No Awareness, Dental Status Current problems with teeth and/or dentures?: No Does patient usually wear dentures?: No   CIWA:    COWS:     Musculoskeletal: Strength & Muscle Tone: within normal limits Gait & Station: normal Patient leans: N/A  Psychiatric Specialty Exam: Physical Exam Vitals and nursing note reviewed.  HENT:     Head: Normocephalic and atraumatic.  Pulmonary:     Effort: Pulmonary effort is normal.  Neurological:     General: No focal deficit present.     Mental Status: She is alert and oriented to person, place, and time.     Review of Systems  Blood pressure 138/78, pulse 89, temperature  97.9 F (36.6 C), temperature source Oral, resp. rate 18, height 5' 3.5" (1.613 m), weight 72.6 kg, SpO2 99 %.Body mass index is 27.9 kg/m.  General Appearance: Fairly Groomed  Eye Contact:  Fair  Speech:  Pressured  Volume:  Normal  Mood:  Anxious  Affect:  Congruent  Thought Process:  Coherent and Descriptions of Associations: Intact  Orientation:  Full (Time, Place, and Person)  Thought Content:  Logical  Suicidal Thoughts:  No  Homicidal Thoughts:  No  Memory:  Immediate;   Fair Recent;   Fair Remote;   Fair  Judgement:  Intact  Insight:  Fair  Psychomotor Activity:  Increased  Concentration:  Concentration: Fair and Attention Span: Fair  Recall:  [employer] of Knowledge:  Fair  Language:  Good  Akathisia:  Negative  Handed:  Right  AIMS (if indicated):     Assets:  Desire for Improvement Housing Resilience  ADL's:  Intact  Cognition:  WNL  Sleep:  Number of Hours: 7     Treatment Plan Summary: Daily contact with patient to assess and evaluate symptoms and progress in treatment, Medication management and Plan : Patient is seen and examined.  Patient is a 50 year old female with the above-stated past psychiatric history who is seen in follow-up.  Diagnosis: 1. Schizoaffective disorder; bipolar type 2. Mild iron deficiency anemia most likely secondary to nutritional deficit. 3. Biatrial enlargement. 4.  Hyperlipidemia  Pertinent findings on examination  today: 1.  Patient's disorganization, rumination, somatic delusions and mania seems to be slowly improving. 2.  Irritability is significantly improved. 3.  Sleep is improved. 4.  No suicidal or homicidal ideation today. 5.  Hypertension is stable  Plan: 1. Continue folic acid 1 mg p.o. daily for nutritional supplementation. 2. Continue hydroxyzine 25 mg p.o. 3 times daily as needed anxiety. 3. Continue Zyprexa agitation protocol as needed. 4. Increase Zyprexa to 10 mg p.o. daily and 20 mg p.o. nightly for mood stability and psychosis. 5. Continue thiamine 100 mg p.o. daily for nutritional supplementation. 6. Continue multivitamin 1 tablet p.o. daily for nutritional supplementation. 7. Continue trazodone 50 mg p.o. nightly as needed insomnia. 8.  Continue Lipitor 10 mg p.o. daily for hyperlipidemia. 9.    Increase Tegretol to 200 mg p.o. twice daily for mood stability. 10.  CBC with differential, liver function enzymes and Tegretol level in a.m. tomorrow. 11.  If all laboratories are good and she continues to improve we will consider discharge in 1 to 2 days. Antonieta Pert, MD 10/12/2020, 11:26 AM

## 2020-10-12 NOTE — Progress Notes (Signed)
   10/11/20 2100  Psych Admission Type (Psych Patients Only)  Admission Status Voluntary  Psychosocial Assessment  Patient Complaints Suspiciousness  Eye Contact Fair  Facial Expression Anxious;Pensive  Affect Anxious;Preoccupied  Furniture conservator/restorer  Appearance/Hygiene Unremarkable  Behavior Characteristics Anxious  Mood Suspicious;Preoccupied  Aggressive Behavior  Effect No apparent injury  Thought Process  Coherency Disorganized  Content Confabulation;Delusions;Paranoia  Delusions Grandeur;Paranoid  Perception Hallucinations  Hallucination Visual;Auditory (Observed talking to self, pacing in her room)  Judgment Poor  Confusion Mild  Danger to Self  Current suicidal ideation? Denies  Danger to Others  Danger to Others None reported or observed

## 2020-10-12 NOTE — Progress Notes (Signed)
Recreation Therapy Notes  Date: 3.3.22 Time: 1000 Location: 500 Hall Dayroom  Group Topic: Decision Making, Problem Solving, Communication  Goal Area(s) Addresses:  Patient will effectively work with peer towards shared goal.  Patient will identify factors that guided their decision making.  Patient will pro-socially communicate ideas during group session.   Behavioral Response: Engaged  Intervention: Survival Scenario - pencil, paper  Activity:  Patients were given a scenario that they were going to be stranded on a deserted [place] for several months before being rescued. Writer tasked them with making a list of 15 things they would choose to bring with them for "survival". The list of items was prioritized most important to least. Each patient would come up with their own list, then work together to create a new list of 15 items while in a group of 3-5 peers. LRT discussed each person's list and how it differed from others. The debrief included discussion of priorities, good decisions versus bad decisions, and how it is important to think before acting so we can make the best decision possible. LRT tied the concept of effective communication among group members to patient's support systems outside of the hospital and its benefit post discharge.  Education: Pharmacist, community, Priorities, Support System, Discharge Planning    Education Outcome:  Acknowledges education/In group clarification/Needs additional education  Clinical Observations/Feedback: Pt stated "choices are a very important part of life, so it's important to make wise decisions. For her personal list, pt listed things like compass, coconut opener, boxes of nuts, flame throwers, dried goods, etc.  Pt explained that each item was vital for her survival.  Pt and partner combined list and come up with things like hour glass, companionship, machete, spear, ever running watch, clothes, etc.  Pt stated the hour glass would be  used to time yourself.  Pt explained that in some situations she didn't have a choice.  The example pt gave was when joined [employer] at [employer].  Pt stated she was forced to marry different people at [age], 32 and 18.  Pt did admit that it wasn't a good idea to marry a different person every year and would have made a different decision in different circumstances.       Caroll Rancher, LRT/CTRS    Caroll Rancher A 10/12/2020 11:55 AM

## 2020-10-12 NOTE — [facility] Group Notes (Signed)
Occupational Therapy Group Note Date: 10/12/2020 Group Topic/Focus: Self-Esteem  Group Description: Group encouraged increased engagement and participation through discussion and activity focused on self-esteem. Patients explored and discussed the differences between healthy and low self-esteem and how it affects our daily lives and occupations with a focus on relationships, work, school, self-care, and personal leisure interests. Group discussion then transitioned into identifying specific strategies to boost self-esteem and engaged in a collaborative and independent activity looking at positive ways to describe oneself A-Z.   Therapeutic Goal(s): Understand and recognize the differences between healthy and low self-esteem Identify healthy strategies to improve/build self-esteem Participation Level: Patient did not attend OT group session despite personal invitation. Pt was asleep in bed.    Plan: Continue to engage patient in OT groups 2 - 3x/week.   10/12/2020  Casimiro Lienhard, MOT, OTR/L  

## 2020-10-12 NOTE — Progress Notes (Signed)
   10/12/20 0617  Vital Signs  Pulse Rate 89  Pulse Rate Source Monitor  BP 138/78  BP Location Right Arm  BP Method Automatic  Patient Position (if appropriate) Standing  Oxygen Therapy  SpO2 99 %  Sleep  Number of Hours 7

## 2020-10-12 NOTE — Progress Notes (Signed)
   10/12/20 2200  Psych Admission Type (Psych Patients Only)  Admission Status Voluntary  Psychosocial Assessment  Patient Complaints Suspiciousness  Eye Contact Fair  Facial Expression Anxious;Pensive  Affect Anxious;Preoccupied  Furniture conservator/restorer  Appearance/Hygiene Unremarkable  Behavior Characteristics Cooperative  Mood Suspicious;Preoccupied  Aggressive Behavior  Effect No apparent injury  Thought Process  Coherency Disorganized  Content Confabulation;Delusions;Paranoia  Delusions Grandeur;Paranoid  Perception Hallucinations  Hallucination Visual;Auditory (Observed talking to self, pacing in her room)  Judgment Poor  Confusion Mild  Danger to Self  Current suicidal ideation? Denies  Danger to Others  Danger to Others None reported or observed

## 2020-10-13 DIAGNOSIS — F250 Schizoaffective disorder, bipolar type: Principal | ICD-10-CM

## 2020-10-13 LAB — COMPREHENSIVE METABOLIC PANEL
ALT: 28 U/L (ref 0–44)
AST: 25 U/L (ref 15–41)
Albumin: 3.6 g/dL (ref 3.5–5.0)
Alkaline Phosphatase: 71 U/L (ref 38–126)
Anion gap: 9 (ref 5–15)
BUN: 12 mg/dL (ref 6–20)
CO2: 25 mmol/L (ref 22–32)
Calcium: 9.3 mg/dL (ref 8.9–10.3)
Chloride: 105 mmol/L (ref 98–111)
Creatinine, Ser: 0.72 mg/dL (ref 0.44–1.00)
GFR, Estimated: >60 mL/min (ref >60)
Glucose, Bld: 114 mg/dL — ABNORMAL HIGH (ref 70–99)
Potassium: 3.8 mmol/L (ref 3.5–5.1)
Sodium: 139 mmol/L (ref 135–145)
Total Bilirubin: 0.5 mg/dL (ref 0.3–1.2)
Total Protein: 6.6 g/dL (ref 6.5–8.1)

## 2020-10-13 LAB — CBC WITH DIFFERENTIAL/PLATELET
Abs Immature Granulocytes: 0.03 10*3/uL (ref 0.00–0.07)
Basophils Absolute: 0 10*3/uL (ref 0.0–0.1)
Basophils Relative: 0 %
Eosinophils Absolute: 0.2 10*3/uL (ref 0.0–0.5)
Eosinophils Relative: 3 %
HCT: 35 % — ABNORMAL LOW (ref 36.0–46.0)
Hemoglobin: 10.9 g/dL — ABNORMAL LOW (ref 12.0–15.0)
Immature Granulocytes: 1 %
Lymphocytes Relative: 24 %
Lymphs Abs: 1.3 10*3/uL (ref 0.7–4.0)
MCH: 29.6 pg (ref 26.0–34.0)
MCHC: 31.1 g/dL (ref 30.0–36.0)
MCV: 95.1 fL (ref 80.0–100.0)
Monocytes Absolute: 0.4 10*3/uL (ref 0.1–1.0)
Monocytes Relative: 8 %
Neutro Abs: 3.3 10*3/uL (ref 1.7–7.7)
Neutrophils Relative %: 64 %
Platelets: 238 10*3/uL (ref 150–400)
RBC: 3.68 MIL/uL — ABNORMAL LOW (ref 3.87–5.11)
RDW: 14 % (ref 11.5–15.5)
WBC: 5.2 10*3/uL (ref 4.0–10.5)
nRBC: 0 % (ref 0.0–0.2)

## 2020-10-13 LAB — CARBAMAZEPINE LEVEL, TOTAL: Carbamazepine Lvl: 4.3 ug/mL (ref 4.0–12.0)

## 2020-10-13 MED ORDER — ATORVASTATIN CALCIUM 10 MG PO TABS: 10.0000 mg | ORAL_TABLET | Freq: Every day | ORAL | 0 refills | Status: AC

## 2020-10-13 MED ORDER — OLANZAPINE 20 MG PO TBDP: 20.0000 mg | ORAL_TABLET | Freq: Every day | ORAL | 0 refills | Status: AC

## 2020-10-13 MED ORDER — HYDROXYZINE HCL 25 MG PO TABS: 25.0000 mg | ORAL_TABLET | Freq: Three times a day (TID) | ORAL | 0 refills | Status: AC | PRN

## 2020-10-13 MED ORDER — OLANZAPINE 10 MG PO TBDP: 10.0000 mg | ORAL_TABLET | Freq: Every day | ORAL | 0 refills | Status: AC

## 2020-10-13 MED ORDER — CARBAMAZEPINE 100 MG PO CHEW: 200.0000 mg | CHEWABLE_TABLET | Freq: Two times a day (BID) | ORAL | 0 refills | Status: AC

## 2020-10-13 MED ORDER — TRAZODONE HCL 50 MG PO TABS: 50.0000 mg | ORAL_TABLET | Freq: Every evening | ORAL | 0 refills | Status: AC | PRN

## 2020-10-13 NOTE — [facility] Group Notes (Signed)
[facility] LCSW Group Therapy  10/13/2020 1:25 PM  Type of Therapy:  Group Therapy: Health and Wellness   Summary of Progress/Problems: CSW supplemented this group with handout and worksheets.    Meredith A Rumbley 10/13/2020, 1:25 PM  

## 2020-10-13 NOTE — Progress Notes (Signed)
Pt discharged to lobby. Pt was stable and appreciative at that time. All papers, samples and prescriptions were given and valuables returned. Verbal understanding expressed. Denies SI/HI and A/VH. Pt given opportunity to express concerns and ask questions.  

## 2020-10-13 NOTE — [facility] Suicide Risk Assessment (Signed)
[facility] Discharge Suicide Risk Assessment   Principal Problem: Schizoaffective disorder, bipolar type [facility]) Discharge Diagnoses: Principal Problem:   Schizoaffective disorder, bipolar type (HCC) Active Problems:   Bipolar disorder (HCC)  Subjective: Patient is a 50 year old female with a reported past psychiatric history significant for schizoaffective disorder bipolar type, who was admitted for management of AVH, delusions, paranoia, pressured speech, and agitation. On assessment today, the patient denies AVH, ideas of reference, paranoia, or first rank symptoms. She denies SI or HI. She reports stable sleep and appetite. She voices no physical complaints and denies medication side-effects. Time was spent discussing the need to see her PCP for f/u of her anemia. She was also educated on her Zyprexa and Tegretol dosing and advised that she will need ongoing Tegretol blood levels, CBC monitoring, LFT and metabolic lab monitoring while on these medications. She requests discharge today and agrees to outpatient follow-up.   Total Time Spent in Direct Patient Care:  I personally spent 30 minutes on the unit in direct patient care. The direct patient care time included face-to-face time with the patient, reviewing the patient's chart, communicating with other professionals, and coordinating care. Greater than 50% of this time was spent in counseling or coordinating care with the patient regarding goals of hospitalization, psycho-education, and discharge planning needs.  Musculoskeletal: Strength & Muscle Tone: within normal limits Gait & Station: normal, steady Patient leans: N/A  Psychiatric Specialty Exam:   Blood pressure 136/75, pulse 87, temperature 98.3 F (36.8 C), temperature source Oral, resp. rate 18, height 5' 3.5" (1.613 m), weight 72.6 kg, SpO2 100 %.Body mass index is 27.9 kg/m.  General Appearance: casually dressed, adequate hygiene  Eye Contact::  Good  Speech:  Clear and Coherent  and Normal Rate409  Volume:  Normal  Mood:  described as "fine" - appears mildly anxious  Affect:  Congruent  Thought Process:  Goal Directed  Orientation:  Full (Time, Place, and Person)  Thought Content:  Denies AVH, delusions, or paranoia; no obsessions/compulsions, acute psychosis or delusions noted on exam  Suicidal Thoughts:  Denied  Homicidal Thoughts:  Denied  Memory:  Immediate;   Fair  Judgement:  Fair  Insight:  Fair  Psychomotor Activity:  Normal, no cogwheeling, no tremor  Concentration:  Fair  Recall:  [employer] of Knowledge:Fair  Language: Good  Akathisia:  Negative  AIMS (if indicated):   0  Assets:  Desire for Improvement Housing Resilience  Sleep:  Number of Hours: 9.75  Cognition: WNL  ADL's:  Intact   Mental Status Per Nursing Assessment::   On Admission:  AVH, agitation, paranoia, delusions  Demographic Factors:  unemployed, lives alone  Loss Factors: Decrease in vocational status  Historical Factors: Previous inpatient admissions  Risk Reduction Factors:   has a child; response to medications, positive social supports  Continued Clinical Symptoms:  Previous Psychiatric Diagnoses and Treatments Schizoaffective d/o - bipolar type diagnosis  Cognitive Features That Contribute To Risk:  None    Suicide Risk:  Mild:  Suicidal ideation of limited frequency, intensity, duration, and specificity.  There are no identifiable plans, no associated intent, mild dysphoria and related symptoms, good self-control (both objective and subjective assessment), few other risk factors, and identifiable protective factors, including available and accessible social support.   Follow-up Information    [facility]. Go on 10/17/2020.   Specialty: Behavioral Health Why: You have a walk in appointment for therapy services on 10/17/20 at 7:45 am.  You also have a  walk in appointment for medication management on 10/30/20 at 7:45 am.  Walk in  appointments are first come, first served and are held in person. Contact information: [address] [place] [address] [phone]              Plan Of Care/Follow-up recommendations:  Activity:  as tolerated Diet:  heart healthy Other:  Patient advised she will need ongoing metabolic lab monitoring, EKG monitoring, CBC monitoring, Tegretol level monitoring, and LFT monitoring while on Tegretol and Zyprexa. Compliance with medications was encouraged and she was urged to keep her scheduled outpatient mental health follow up appointment. She was advised to have her anemia rechecked by her PCP after discharge.   Comer Locket, MD, FAPA 10/13/2020, 11:52 AM

## 2020-10-13 NOTE — Progress Notes (Signed)
Recreation Therapy Notes   Date: 10/13/2020 Time: 1000a Location: [address] Dayroom   Group Topic: Self-esteem  Goal Area(s) Addresses:  Patient will identify and write at least one positive trait about themself. Patient will acknowledge the benefit of healthy self-esteem.  Behavioral Response: Active, Appropriate   Intervention: Personalized Plate- printed license plate template, markers, colored pencils   Activity: Patients were instructed to design a personalized license plate, with words and drawings, representing at least 3 positive things about themselves. Patients were given the opportunity to share their completed work with the group.  Education: Healthy self-esteem, Discharge planning  Education Outcome: Acknowledges education   Clinical Observations/Feedback: Joined group session late. Pt eager to engage in group exercise, expressing concern that the session may be over. After LRT reassurance they had time to finish, pt took the licence plate template and successfully completed artwork. Pt shared one word that describes them as "hype" and added hearts to represent love. Pt reflected activities they enjoy with small drawings of a basketball and volleyball.    Nicholos Johns Tyanne Derocher, LRT/CTRS Benito Mccreedy Yanissa Michalsky 10/13/2020, 1:56 PM

## 2020-10-13 NOTE — Tx Team (Signed)
Interdisciplinary Treatment and Diagnostic Plan Update  10/13/2020 Time of Session: 10;10am Patricia [name] MRN: [id number]  Principal Diagnosis: Schizoaffective disorder, bipolar type (HCC)  Secondary Diagnoses: Principal Problem:   Schizoaffective disorder, bipolar type (HCC) Active Problems:   Bipolar disorder (HCC)   Current Medications:  Current Facility-Administered Medications  Medication Dose Route Frequency Provider Last Rate Last Admin   acetaminophen (TYLENOL) tablet 650 mg  650 mg Oral Q6H PRN Antonieta Pert, MD   650 mg at 10/10/20 0757   alum & mag hydroxide-simeth (MAALOX/MYLANTA) 200-200-20 MG/5ML suspension 30 mL  30 mL Oral Q4H PRN Antonieta Pert, MD       atorvastatin (LIPITOR) tablet 10 mg  10 mg Oral Daily Antonieta Pert, MD   10 mg at 10/13/20 9629   carbamazepine (TEGRETOL) chewable tablet 200 mg  200 mg Oral BID Antonieta Pert, MD   200 mg at 10/13/20 5284   hydrOXYzine (ATARAX/VISTARIL) tablet 25 mg  25 mg Oral TID PRN Antonieta Pert, MD       OLANZapine zydis (ZYPREXA) disintegrating tablet 10 mg  10 mg Oral Q8H PRN Antonieta Pert, MD       And   LORazepam (ATIVAN) tablet 1 mg  1 mg Oral Q6H PRN Antonieta Pert, MD       And   ziprasidone (GEODON) injection 20 mg  20 mg Intramuscular Q6H PRN Antonieta Pert, MD       magnesium hydroxide (MILK OF MAGNESIA) suspension 30 mL  30 mL Oral Daily PRN Antonieta Pert, MD       multivitamins with iron tablet 1 tablet  1 tablet Oral Daily Antonieta Pert, MD   1 tablet at 10/13/20 0952   OLANZapine zydis (ZYPREXA) disintegrating tablet 10 mg  10 mg Oral Daily Antonieta Pert, MD   10 mg at 10/13/20 0952   OLANZapine zydis (ZYPREXA) disintegrating tablet 20 mg  20 mg Oral QHS Antonieta Pert, MD   20 mg at 10/12/20 2138   traZODone (DESYREL) tablet 50 mg  50 mg Oral QHS PRN Antonieta Pert, MD       PTA Medications: Medications Prior to Admission   Medication Sig Dispense Refill Last Dose   acetaminophen (TYLENOL) 500 MG tablet Take 500 mg by mouth daily.   Unknown at Unknown time   BIOTIN PO Take 1 tablet by mouth daily.   Unknown at Unknown time   vitamin B-12 (CYANOCOBALAMIN) 100 MCG tablet Take 100 mcg by mouth daily.   Unknown at Unknown time    Patient Stressors: Medication change or noncompliance  Patient Strengths: Ability for insight Motivation for treatment/growth  Treatment Modalities: Medication Management, Group therapy, Case management,  1 to 1 session with clinician, Psychoeducation, Recreational therapy.   Physician Treatment Plan for Primary Diagnosis: Schizoaffective disorder, bipolar type (HCC) Long Term Goal(s): Improvement in symptoms so as ready for discharge Improvement in symptoms so as ready for discharge   Short Term Goals: Ability to identify changes in lifestyle to reduce recurrence of condition will improve Ability to verbalize feelings will improve Ability to demonstrate self-control will improve Ability to identify and develop effective coping behaviors will improve Ability to maintain clinical measurements within normal limits will improve Compliance with prescribed medications will improve Ability to identify changes in lifestyle to reduce recurrence of condition will improve Ability to verbalize feelings will improve Ability to demonstrate self-control will improve Ability to identify and develop effective coping behaviors will  improve Ability to maintain clinical measurements within normal limits will improve Compliance with prescribed medications will improve  Medication Management: Evaluate patient's response, side effects, and tolerance of medication regimen.  Therapeutic Interventions: 1 to 1 sessions, Unit Group sessions and Medication administration.  Evaluation of Outcomes: Adequate for Discharge  Physician Treatment Plan for Secondary Diagnosis: Principal Problem:    Schizoaffective disorder, bipolar type (HCC) Active Problems:   Bipolar disorder (HCC)  Long Term Goal(s): Improvement in symptoms so as ready for discharge Improvement in symptoms so as ready for discharge   Short Term Goals: Ability to identify changes in lifestyle to reduce recurrence of condition will improve Ability to verbalize feelings will improve Ability to demonstrate self-control will improve Ability to identify and develop effective coping behaviors will improve Ability to maintain clinical measurements within normal limits will improve Compliance with prescribed medications will improve Ability to identify changes in lifestyle to reduce recurrence of condition will improve Ability to verbalize feelings will improve Ability to demonstrate self-control will improve Ability to identify and develop effective coping behaviors will improve Ability to maintain clinical measurements within normal limits will improve Compliance with prescribed medications will improve     Medication Management: Evaluate patient's response, side effects, and tolerance of medication regimen.  Therapeutic Interventions: 1 to 1 sessions, Unit Group sessions and Medication administration.  Evaluation of Outcomes: Adequate for Discharge   RN Treatment Plan for Primary Diagnosis: Schizoaffective disorder, bipolar type (HCC) Long Term Goal(s): Knowledge of disease and therapeutic regimen to maintain health will improve  Short Term Goals: Ability to remain free from injury will improve, Ability to verbalize frustration and anger appropriately will improve, Ability to demonstrate self-control, Ability to identify and develop effective coping behaviors will improve and Compliance with prescribed medications will improve  Medication Management: RN will administer medications as ordered by provider, will assess and evaluate patient's response and provide education to patient for prescribed medication. RN will  report any adverse and/or side effects to prescribing provider.  Therapeutic Interventions: 1 on 1 counseling sessions, Psychoeducation, Medication administration, Evaluate responses to treatment, Monitor vital signs and CBGs as ordered, Perform/monitor CIWA, COWS, AIMS and Fall Risk screenings as ordered, Perform wound care treatments as ordered.  Evaluation of Outcomes: Adequate for Discharge   LCSW Treatment Plan for Primary Diagnosis: Schizoaffective disorder, bipolar type (HCC) Long Term Goal(s): Safe transition to appropriate next level of care at discharge, Engage patient in therapeutic group addressing interpersonal concerns.  Short Term Goals: Engage patient in aftercare planning with referrals and resources, Increase social support, Increase ability to appropriately verbalize feelings, Identify triggers associated with mental health/substance abuse issues and Increase skills for wellness and recovery  Therapeutic Interventions: Assess for all discharge needs, 1 to 1 time with Social worker, Explore available resources and support systems, Assess for adequacy in community support network, Educate family and significant other(s) on suicide prevention, Complete Psychosocial Assessment, Interpersonal group therapy.  Evaluation of Outcomes: Adequate for Discharge   Progress in Treatment: Attending groups: Yes. Participating in groups: Yes. Taking medication as prescribed: Yes. Toleration medication: Yes. Family/Significant other contact made: No, will contact:  declined consent Patient understands diagnosis: Yes. Discussing patient identified problems/goals with staff: Yes. Medical problems stabilized or resolved: Yes. Denies suicidal/homicidal ideation: Yes. Issues/concerns per patient self-inventory: No.   New problem(s) identified: No, Describe:  none  New Short Term/Long Term Goal(s): medication stabilization, elimination of SI thoughts, development of comprehensive mental  wellness plan.   Patient Goals:  "To get better"  Discharge Plan or Barriers: Patient is to return home at discharge and follow [facility] with [facility].   Reason for Continuation of Hospitalization: Medication stabilization  Estimated Length of Stay: Adequate for discharge  Attendees: Patient:  10/13/2020   Physician:  10/13/2020   Nursing:  10/13/2020   RN Care Manager: 10/13/2020  Social Worker: Ruthann Cancer, LCSW 10/13/2020   Recreational Therapist:  10/13/2020   Other:  10/13/2020   Other:  10/13/2020   Other: 10/13/2020     Scribe for Treatment Team: Otelia Santee, LCSW 10/13/2020 11:10 AM

## 2020-10-13 NOTE — Discharge Summary (Signed)
Physician Discharge Summary Note  Patient:  Patricia [name] is an 50 y.o., female  MRN:  [id number]  DOB:  04-24-1971  Patient phone:  [phone] (home)   Patient address:   [address] [place] [address],   Total Time spent with patient: Greater than 30 minutes  Date of Admission:  10/07/2020  Date of Discharge: 10-13-20  Reason for Admission: Patient reported had become anorexic and was not eating. She admitted to having dizziness, passing out, having auditory and visual hallucinations & feeling as though people were following her.    Principal Problem: Schizoaffective disorder, bipolar type Patricia [name])  Discharge Diagnoses: Patient Active Problem List   Diagnosis Date Noted  . Schizoaffective disorder, bipolar type (HCC) [F25.0]     Priority: High  . MDD (major depressive disorder), recurrent episode, severe (HCC) [F33.2] 11/12/2019    Priority: Medium  . Bipolar disorder (HCC) [F31.9] 10/08/2020  . Irregular menstrual cycle [N92.6] 03/08/2020  . Menorrhagia with irregular cycle [N92.1] 03/08/2020  . Suicidal ideation [R45.851]   . Depression [F32.A] 05/21/2018  . Overactive bladder [N32.81] 01/19/2018  . Schizophrenia (HCC) [F20.9] 01/19/2018   Past Psychiatric History: Schizoaffective disorder, Bipolar-type  Past Medical History:  Past Medical History:  Diagnosis Date  . Depression    History reviewed. No pertinent surgical history.  Family History: History reviewed. No pertinent family history.  Family Psychiatric  History: See H&P  Social History:  Social History   Substance and Sexual Activity  Alcohol Use Yes  . Alcohol/week: 1.0 standard drink  . Types: 1 Glasses of wine per week   Comment: social      Social History   Substance and Sexual Activity  Drug Use No    Social History   Socioeconomic History  . Marital status: Divorced    Spouse name: Not on file  . Number of children: Not on file  . Years of  education: Not on file  . Highest education level: Some college, no degree  Occupational History  . Not on file  Tobacco Use  . Smoking status: Current Some Day Smoker    Packs/day: 0.50    Years: 15.00    Pack years: 7.50    Types: Cigarettes, Cigars  . Smokeless tobacco: Never Used  Vaping Use  . Vaping Use: Never used  Substance and Sexual Activity  . Alcohol use: Yes    Alcohol/week: 1.0 standard drink    Types: 1 Glasses of wine per week    Comment: social   . Drug use: No  . Sexual activity: Not Currently  Other Topics Concern  . Not on file  Social History Narrative  . Not on file   Social Determinants of Health   Financial Resource Strain: Not on file  Food Insecurity: Not on file  Transportation Needs: Not on file  Physical Activity: Not on file  Stress: Not on file  Social Connections: Not on file   [name] Course: (Per Md's admission evaluation): Patient is a 50 year old female with a reported past psychiatric history significant for schizoaffective disorder; bipolar type who originally presented to the [name] emergency department on 10/04/2020 reporting that she had become anorexic and was not eating. She admitted to having dizziness, passing out, having auditory and visual hallucinations, and feeling as though people were following her. Comprehensive clinical assessment revealed that she had been accompanied by her uncle. Unfortunately information was unable to be collected from him. The patient reported feeling as though people  were following her, and also experiencing visual hallucinations. She apparently told the staff that when she becomes frustrated she bangs her head against the wall. She reported a decrease in appetite, and also had stopped eating and was vomiting. She reported sleeping about 4 5 hours a night. On examination she is quite irritable, and really does not disclose when she last took her last medications. In  consultation she was essentially unchanged on 10/05/2020. In her last psychiatric hospitalization at our facility on 11/11/2019 her discharge medications included Abilify long-acting injectable, Risperdal and temazepam. The patient reported an allergy to Risperdal, and while being held in the emergency department received Zyprexa. Because of the exacerbation of her psychiatric illness she was transferred to our facility on 11/05/2020. On examination today she is very labile. She goes from being fairly pleasant to angry immediately. She has pressured speech and is agitated. She was admitted to the [name] for evaluation and stabilization.   This is one of several psychiatric discharge summaries for this 50 year old AA female with hx of chronic mental illness & multiple psychiatric admissions. She is known in this [facility] & other psychiatric hospitals within the surrounding areas for worsening psychosis such as paranoia, delusions & non-compliant to her treatment regimen. She has been tried on multiple psychotropic medications for her symptoms & it appears nothing has actually been helpful in stabilizing her symptoms because she does not always comply to taking them. She was brought to the [facility] this time around for evaluation & treatment her reports that she had become anorexic and was not eating. She admitted to having dizziness, passing out, having auditory and visual hallucinations & feeling as though people were following her. She was brought to the [name] for evaluation & treatments.  After evaluation of her presenting symptoms this time around, Patricia [name] was recommended for mood stabilization treatments. The medication regimen for her presenting symptoms were discussed & with her consent initiated. She received, stabilized & was discharged on the medications as listed below on her discharge medication lists. She was also enrolled & participated sometimes in the group counseling sessions being offered & held  on this unit. She learned coping skills. She presented on this admission, other significant chronic medical conditions that required treatment & monitoring. She was resumed/discharged on all her pertinent medications for those health issues . She tolerated her treatment regimen without any adverse effects or reactions reported. Reya symptoms responded well to her treatment regimen warranting this discharge. This is evidenced by her daily reports of improved mood, absence of delusions or paranoia & improved food/fluid intake. Her Tegretol level upon discharge is within norm at 4.3.     During the course of her hospitalization, the 15-minute checks were adequate to ensure Bennie's safety.  Patient did not display any dangerous, violent or suicidal behavior on the unit. She interacted with patients & staff appropriately. She participated appropriately in some group sessions/therapies. Her medications were addressed & adjusted to meet her needs. She was recommended for outpatient follow-up care & medication management upon discharge to assure her continuity of care.  At the time of discharge, patient is not reporting any acute suicidal/homicidal ideations. She feels more confident about her self-care & in taking her medications. She currently denies any new issues or concerns. Education and supportive counseling provided throughout her [name] stay & upon discharge.   Today upon her discharge evaluation with the attending psychiatrist, Idalys shares she is doing well. She denies any other specific concerns. She is sleeping  well. Her appetite is good. She denies other physical complaints. She denies AH/VH, delusional thoughts or paranoia. She feels that her medications have been helpful & is in agreement to continue her current treatment regimen as recommended. She was able to engage in safety planning including plan to [facility] or contact emergency services if she feels unable to maintain her own safety  or the safety of others. Pt had no further questions, comments, or concerns. [name] with all personal belongings in no apparent distress. Transportation as arranged by the Child psychotherapistsocial worker.Marland Kitchen.  Physical Findings: AIMS: Facial and Oral Movements Muscles of Facial Expression: None, normal Lips and Perioral Area: None, normal Jaw: None, normal Tongue: None, normal,Extremity Movements Upper (arms, wrists, hands, fingers): None, normal Lower (legs, knees, ankles, toes): None, normal, Trunk Movements Neck, shoulders, hips: None, normal, Overall Severity Severity of abnormal movements (highest score from questions above): None, normal Incapacitation due to abnormal movements: None, normal Patient's awareness of abnormal movements (rate only patient's report): No Awareness, Dental Status Current problems with teeth and/or dentures?: No Does patient usually wear dentures?: No  CIWA:    COWS:     Musculoskeletal: Strength & Muscle Tone: within normal limits Gait & Station: normal Patient leans: N/A  Psychiatric Specialty Exam: Physical Exam Vitals and nursing note reviewed.  Constitutional:      Appearance: She is well-developed.  HENT:     Head: Normocephalic.     Mouth/Throat:     Pharynx: Oropharynx is clear.  Eyes:     Pupils: Pupils are equal, round, and reactive to light.  Cardiovascular:     Rate and Rhythm: Normal rate.  Pulmonary:     Effort: Pulmonary effort is normal.  Abdominal:     Palpations: Abdomen is soft.  Genitourinary:    Comments: Deferred Musculoskeletal:        General: Normal range of motion.     Cervical back: Normal range of motion.  Skin:    General: Skin is warm and dry.  Neurological:     General: No focal deficit present.     Mental Status: She is alert and oriented to person, place, and time. Mental status is at baseline.     Review of Systems  Constitutional: Negative.  Negative for chills, diaphoresis and fever.  HENT: Negative.  Negative  for congestion and sore throat.   Eyes: Negative.   Respiratory: Negative.  Negative for cough, shortness of breath and wheezing.   Cardiovascular: Negative.  Negative for chest pain and palpitations.  Gastrointestinal: Negative.  Negative for heartburn, nausea and vomiting.  Genitourinary: Negative.   Musculoskeletal: Negative.   Skin: Negative.   Neurological: Negative.  Negative for dizziness, tremors, seizures, weakness and headaches (Stable).  Endo/Heme/Allergies: Negative.        Allergies: Oxycodone, Latex  Psychiatric/Behavioral: Positive for depression (Stable) and hallucinations (Hx. psychosis (stable)). Negative for memory loss, substance abuse and suicidal ideas. The patient has insomnia (Stable ). The patient is not nervous/anxious.     Blood pressure 136/75, pulse 87, temperature 98.3 F (36.8 C), temperature source Oral, resp. rate 18, height 5' 3.5" (1.613 m), weight 72.6 kg, SpO2 100 %.Body mass index is 27.9 kg/m.  General Appearance: See Md's discharge SRA   Has this patient used any form of tobacco in the last 30 days? (Cigarettes, Smokeless Tobacco, Cigars, and/or Pipes): N/A  Blood Alcohol level:  Lab Results  Component Value Date   ETH <10 10/04/2020   ETH <10 11/11/2019  Metabolic Disorder Labs:  Lab Results  Component Value Date   HGBA1C 5.5 10/09/2020   MPG 111.15 10/09/2020   MPG 111.15 05/24/2018   No results found for: PROLACTIN Lab Results  Component Value Date   CHOL 202 (H) 10/09/2020   TRIG 108 10/09/2020   HDL 52 10/09/2020   CHOLHDL 3.9 10/09/2020   VLDL 22 10/09/2020   LDLCALC 128 (H) 10/09/2020   See Psychiatric Specialty Exam and Suicide Risk Assessment completed by Attending Physician prior to discharge.  Discharge destination:  Home  Is patient on multiple antipsychotic therapies at discharge: No   Do you recommend tapering to monotherapy for antipsychotics?  NA   Has Patient had three or more failed trials of antipsychotic  monotherapy by history:  Yes,   Antipsychotic medications that previously failed include:   1.  Olanzapine., 2.  Hinda Glatter. and 3.  Geodon.  Recommended Plan for Multiple Antipsychotic Therapies: NA  Allergies as of 10/13/2020      Reactions   Oxycodone Shortness Of Breath, Palpitations   Risperdal [risperidone] Swelling   Tongue swelling   Latex Itching, Other (See Comments)   "Makes me itch and burn"      Medication List    STOP taking these medications   BIOTIN PO   vitamin B-12 100 MCG tablet Commonly known as: CYANOCOBALAMIN     TAKE these medications     Indication  acetaminophen 500 MG tablet Commonly known as: TYLENOL Take 500 mg by mouth daily.  Indication: Fever, Pain   atorvastatin 10 MG tablet Commonly known as: LIPITOR Take 1 tablet (10 mg total) by mouth daily. For high cholesterol Start taking on: October 14, 2020  Indication: Inherited Heterozygous Hypercholesterolemia   carbamazepine 100 MG chewable tablet Commonly known as: TEGRETOL Chew 2 tablets (200 mg total) by mouth 2 (two) times daily. For mood stabilization  Indication: Mood stabilization   hydrOXYzine 25 MG tablet Commonly known as: ATARAX/VISTARIL Take 1 tablet (25 mg total) by mouth 3 (three) times daily as needed for anxiety.  Indication: Feeling Anxious   OLANZapine zydis 20 MG disintegrating tablet Commonly known as: ZYPREXA Take 1 tablet (20 mg total) by mouth at bedtime. For mood control  Indication: Mood control   OLANZapine zydis 10 MG disintegrating tablet Commonly known as: ZYPREXA Take 1 tablet (10 mg total) by mouth daily. For mood control Start taking on: October 14, 2020  Indication: Mood control   traZODone 50 MG tablet Commonly known as: DESYREL Take 1 tablet (50 mg total) by mouth at bedtime as needed for sleep.  Indication: Trouble Sleeping       Follow-up Information    [name]. Go on 10/17/2020.   Specialty: Behavioral Health Why: You  have a walk in appointment for therapy services on 10/17/20 at 7:45 am.  You also have a walk in appointment for medication management on 10/30/20 at 7:45 am.  Walk in appointments are first come, first served and are held in person. Contact information: [phone] [place] [address] [phone]             Follow-up recommendations: Activity:  As tolerated Diet: As recommended by your primary care doctor. Keep all scheduled follow-up appointments as recommended.    Comments: Patient is instructed prior to discharge to: Take all medications as prescribed by his/her mental healthcare provider. Report any adverse effects and or reactions from the medicines to his/her outpatient provider promptly. Patient has been instructed & cautioned:  To not engage in alcohol and or illegal drug use while on prescription medicines. In the event of worsening symptoms, patient is instructed to call the crisis hotline, 911 and or go to the nearest ED for appropriate evaluation and treatment of symptoms. To follow-up with his/her primary care provider for your other medical issues, concerns and or health care needs.   Signed: Armandina Stammer, NP, PMHNP, FNP-BC 10/13/2020, 10:46 AM

## 2020-10-13 NOTE — Progress Notes (Signed)
   10/13/20 0500  Sleep  Number of Hours 9.75

## 2020-10-13 NOTE — Progress Notes (Signed)
  [facility] Adult Case Management Discharge Plan :  Will you be returning to the same living situation after discharge:  Yes,  to home At discharge, do you have transportation home?: No. Safe Transport will be arranged Do you have the ability to pay for your medications: No. Samples to be provided at discharge  Release of information consent forms completed and in the chart;  Patient's signature needed at discharge.  Patient to Follow up at:  Follow-up Information    [facility] Go on 10/17/2020.   Specialty: Behavioral Health Why: You have a walk in appointment for therapy services on 10/17/20 at 7:45 am.  You also have a walk in appointment for medication management on 10/30/20 at 7:45 am.  Walk in appointments are first come, first served and are held in person. Contact information: [address] [place] [address] [phone]              Next level of care provider has access to [facility] Link:yes  Safety Planning and Suicide Prevention discussed: Yes,  with patient  Have you used any form of tobacco in the last 30 days? (Cigarettes, Smokeless Tobacco, Cigars, and/or Pipes): Yes  Has patient been referred to the Quitline?: Patient refused referral  Patient has been referred for addiction treatment: N/A  Otelia Santee, LCSW 10/13/2020, 11:21 AM

## 2021-03-31 ENCOUNTER — Emergency Department (HOSPITAL_COMMUNITY): Payer: Self-pay

## 2021-03-31 ENCOUNTER — Emergency Department (HOSPITAL_COMMUNITY)
Admission: EM | Admit: 2021-03-31 | Discharge: 2021-03-31 | Disposition: A | Discharge status: Home / Self Care | Payer: Self-pay | Attending: Emergency Medicine | Admitting: Emergency Medicine

## 2021-03-31 ENCOUNTER — Encounter (HOSPITAL_COMMUNITY): Payer: Self-pay | Admitting: Emergency Medicine

## 2021-03-31 ENCOUNTER — Other Ambulatory Visit: Payer: Self-pay

## 2021-03-31 DIAGNOSIS — R55 Syncope and collapse: Secondary | ICD-10-CM | POA: Insufficient documentation

## 2021-03-31 DIAGNOSIS — Z91040 Latex allergy status: Secondary | ICD-10-CM | POA: Insufficient documentation

## 2021-03-31 DIAGNOSIS — R058 Other specified cough: Secondary | ICD-10-CM

## 2021-03-31 DIAGNOSIS — F419 Anxiety disorder, unspecified: Secondary | ICD-10-CM

## 2021-03-31 DIAGNOSIS — F17210 Nicotine dependence, cigarettes, uncomplicated: Secondary | ICD-10-CM | POA: Insufficient documentation

## 2021-03-31 DIAGNOSIS — R0981 Nasal congestion: Secondary | ICD-10-CM

## 2021-03-31 DIAGNOSIS — U071 COVID-19: Secondary | ICD-10-CM | POA: Insufficient documentation

## 2021-03-31 LAB — COMPREHENSIVE METABOLIC PANEL
ALT: 18 U/L (ref 0–44)
AST: 26 U/L (ref 15–41)
Albumin: 3.5 g/dL (ref 3.5–5.0)
Alkaline Phosphatase: 76 U/L (ref 38–126)
Anion gap: 8 (ref 5–15)
BUN: 10 mg/dL (ref 6–20)
CO2: 25 mmol/L (ref 22–32)
Calcium: 8.9 mg/dL (ref 8.9–10.3)
Chloride: 102 mmol/L (ref 98–111)
Creatinine, Ser: 0.71 mg/dL (ref 0.44–1.00)
GFR, Estimated: >60 mL/min (ref >60)
Glucose, Bld: 97 mg/dL (ref 70–99)
Potassium: 3.7 mmol/L (ref 3.5–5.1)
Sodium: 135 mmol/L (ref 135–145)
Total Bilirubin: 0.6 mg/dL (ref 0.3–1.2)
Total Protein: 6.7 g/dL (ref 6.5–8.1)

## 2021-03-31 LAB — RESP PANEL BY RT-PCR (FLU A&B, COVID) ARPGX2
Influenza A by PCR: NEGATIVE
Influenza B by PCR: NEGATIVE
SARS Coronavirus 2 by RT PCR: POSITIVE — AB

## 2021-03-31 LAB — CBC
HCT: 35.2 % — ABNORMAL LOW (ref 36.0–46.0)
Hemoglobin: 11.5 g/dL — ABNORMAL LOW (ref 12.0–15.0)
MCH: 29.8 pg (ref 26.0–34.0)
MCHC: 32.7 g/dL (ref 30.0–36.0)
MCV: 91.2 fL (ref 80.0–100.0)
Platelets: 246 10*3/uL (ref 150–400)
RBC: 3.86 MIL/uL — ABNORMAL LOW (ref 3.87–5.11)
RDW: 12.5 % (ref 11.5–15.5)
WBC: 3.3 10*3/uL — ABNORMAL LOW (ref 4.0–10.5)
nRBC: 0 % (ref 0.0–0.2)

## 2021-03-31 NOTE — Discharge Instructions (Addendum)
It was our pleasure to provide your ER care today - we hope that you feel better.  Your chest xray looks good - no pneumonia is seen.   Your covid test is pending and should be resulted in the next 2-3 hours - you may check MyChart or call for results.   Drink adequate fluids/stay well hydrated. Eat balanced diet.   Follow up with primary care doctor this coming week.  For mental health issues and/or crisis, you may go directly to the [facility]al [facility] Ur[facility] - it is open 24/7 and walk-ins are welcome - see attached information.   Return to ER if worse, new symptoms, chest pain, trouble breathing, fainting, new/severe pain, or other concern.

## 2021-03-31 NOTE — ED Notes (Signed)
Dr. Denton Lank at bedside assessing pt at this time.

## 2021-03-31 NOTE — ED Notes (Signed)
Portable Xray at bedside.

## 2021-03-31 NOTE — ED Triage Notes (Signed)
Pt c/o nausea/vomiting, nasal congestion and "passing out" x 2. Pt reports that she is in the "last stages" of pneumonia.

## 2021-03-31 NOTE — ED Notes (Signed)
This RN went in to give pt AVS and discharge instructions. Pt states, "I'm suicidal and having delirium and I am being discharged?" Pt questioned regarding her statement, and pt states, "I'm pretty sure I told the doctor." MD notified; MD to talk with patient. When asked if she has a plan, pt states, "I'm not sure. Maybe walk out into traffic."

## 2021-03-31 NOTE — ED Provider Notes (Addendum)
[facility] EMERGENCY DEPARTMENT Provider Note   CSN: [id number] Arrival date & time: 03/31/21  1519     History Chief Complaint  Patient presents with   Loss of Consciousness    Patricia [name] is a 50 y.o. female.  Patient c/o non prod cough, nasal congestion, and indicates had passed out in past week. Symptoms acute onset, ,moderate, persistent. States was taking an antibiotic. Notes episode of nausea/vomiting today - no bloody or bilious emesis. No abd pain or distension. No diarrhea or constipation. No fever/chills. No known ill contacts or known covid exposure. No chest pain or sob. Denies dysuria or gu c/o. No headache. As relates passing out episode states thinks she may have passed out at home - denies trauma, fall or injury. No blood loss, rectal bleeding or melena.   The history is provided by the patient.  Loss of Consciousness Associated symptoms: no chest pain, no confusion, no fever, no headaches, no palpitations, no shortness of breath and no vomiting       Past Medical History:  Diagnosis Date   Depression     Patient Active Problem List   Diagnosis Date Noted   Bipolar disorder (HCC) 10/08/2020   Irregular menstrual cycle 03/08/2020   Menorrhagia with irregular cycle 03/08/2020   MDD (major depressive disorder), recurrent episode, severe (HCC) 11/12/2019   Suicidal ideation    Schizoaffective disorder, bipolar type (HCC)    Depression 05/21/2018   Overactive bladder 01/19/2018   Schizophrenia (HCC) 01/19/2018    History reviewed. No pertinent surgical history.   OB History   No obstetric history on file.     No family history on file.  Social History   Tobacco Use   Smoking status: Some Days    Packs/day: 0.50    Years: 15.00    Pack years: 7.50    Types: Cigarettes, Cigars   Smokeless tobacco: Never  Vaping Use   Vaping Use: Never used  Substance Use Topics   Alcohol use: Yes    Alcohol/week: 1.0 standard  drink    Types: 1 Glasses of wine per week    Comment: social    Drug use: No    Home Medications Prior to Admission medications   Medication Sig Start Date End Date Taking? Authorizing Provider  acetaminophen (TYLENOL) 500 MG tablet Take 500 mg by mouth daily.    [provider]  atorvastatin (LIPITOR) 10 MG tablet Take 1 tablet (10 mg total) by mouth daily. For high cholesterol 10/14/20   Armandina Stammer I, NP  carbamazepine (TEGRETOL) 100 MG chewable tablet Chew 2 tablets (200 mg total) by mouth 2 (two) times daily. For mood stabilization 10/13/20   Armandina Stammer I, NP  hydrOXYzine (ATARAX/VISTARIL) 25 MG tablet Take 1 tablet (25 mg total) by mouth 3 (three) times daily as needed for anxiety. 10/13/20   Armandina Stammer I, NP  OLANZapine zydis (ZYPREXA) 10 MG disintegrating tablet Take 1 tablet (10 mg total) by mouth daily. For mood control 10/14/20   Armandina Stammer I, NP  OLANZapine zydis (ZYPREXA) 20 MG disintegrating tablet Take 1 tablet (20 mg total) by mouth at bedtime. For mood control 10/13/20   Armandina Stammer I, NP  traZODone (DESYREL) 50 MG tablet Take 1 tablet (50 mg total) by mouth at bedtime as needed for sleep. 10/13/20   Armandina Stammer I, NP    Allergies    Oxycodone, Risperdal [risperidone], and Latex  Review of Systems   Review of Systems  Constitutional:  Negative for chills and fever.  HENT:  Positive for congestion. Negative for sore throat.   Eyes:  Negative for redness.  Respiratory:  Positive for cough. Negative for shortness of breath.   Cardiovascular:  Positive for syncope. Negative for chest pain, palpitations and leg swelling.  Gastrointestinal:  Negative for abdominal pain, blood in stool, diarrhea and vomiting.  Genitourinary:  Negative for dysuria, flank pain and vaginal bleeding.  Musculoskeletal:  Negative for back pain and neck pain.  Skin:  Negative for rash.  Neurological:  Negative for headaches.  Hematological:  Does not bruise/bleed easily.   Psychiatric/Behavioral:  Negative for confusion.    Physical Exam Updated Vital Signs BP (!) 107/59   Pulse 82   Temp 99 F (37.2 C) (Oral)   Resp 16   SpO2 100%   Physical Exam Vitals and nursing note reviewed.  Constitutional:      Appearance: Normal appearance. She is well-developed.  HENT:     Head: Atraumatic.     Nose: Nose normal.     Mouth/Throat:     Mouth: Mucous membranes are moist.  Eyes:     General: No scleral icterus.    Conjunctiva/sclera: Conjunctivae normal.     Pupils: Pupils are equal, round, and reactive to light.  Neck:     Vascular: No carotid bruit.     Trachea: No tracheal deviation.  Cardiovascular:     Rate and Rhythm: Normal rate and regular rhythm.     Pulses: Normal pulses.     Heart sounds: Normal heart sounds. No murmur heard.   No friction rub. No gallop.  Pulmonary:     Effort: Pulmonary effort is normal. No respiratory distress.     Breath sounds: Normal breath sounds.  Abdominal:     General: Bowel sounds are normal. There is no distension.     Palpations: Abdomen is soft.     Tenderness: There is no abdominal tenderness. There is no guarding.  Genitourinary:    Comments: No cva tenderness.  Musculoskeletal:        General: No swelling.     Cervical back: Normal range of motion and neck supple. No rigidity or tenderness. No muscular tenderness.     Right lower leg: No edema.     Left lower leg: No edema.     Comments: CTLS spine, non tender, aligned, no step off.   Skin:    General: Skin is warm and dry.     Findings: No rash.  Neurological:     Mental Status: She is alert.     Comments: Alert, speech normal. Motor/sens grossly intact bil.   Psychiatric:        Mood and Affect: Mood normal.    ED Results / Procedures / Treatments   Labs (all labs ordered are listed, but only abnormal results are displayed) Results for orders placed or performed during the hospital encounter of 03/31/21  CBC  Result Value Ref Range    WBC 3.3 (L) 4.0 - 10.5 K/uL   RBC 3.86 (L) 3.87 - 5.11 MIL/uL   Hemoglobin 11.5 (L) 12.0 - 15.0 g/dL   HCT 74.2 (L) 59.5 - 63.8 %   MCV 91.2 80.0 - 100.0 fL   MCH 29.8 26.0 - 34.0 pg   MCHC 32.7 30.0 - 36.0 g/dL   RDW 75.6 43.3 - 29.5 %   Platelets 246 150 - 400 K/uL   nRBC 0.0 0.0 - 0.2 %  Comprehensive metabolic panel  Result Value Ref Range   Sodium 135 135 - 145 mmol/L   Potassium 3.7 3.5 - 5.1 mmol/L   Chloride 102 98 - 111 mmol/L   CO2 25 22 - 32 mmol/L   Glucose, Bld 97 70 - 99 mg/dL   BUN 10 6 - 20 mg/dL   Creatinine, Ser 4.090.71 0.44 - 1.00 mg/dL   Calcium 8.9 8.9 - 81.110.3 mg/dL   Total Protein 6.7 6.5 - 8.1 g/dL   Albumin 3.5 3.5 - 5.0 g/dL   AST 26 15 - 41 U/L   ALT 18 0 - 44 U/L   Alkaline Phosphatase 76 38 - 126 U/L   Total Bilirubin 0.6 0.3 - 1.2 mg/dL   GFR, Estimated >91>60 >47>60 mL/min   Anion gap 8 5 - 15    EKG EKG Interpretation  Date/Time:  Saturday March 31 2021 15:37:32 EDT Ventricular Rate:  81 PR Interval:  126 QRS Duration: 76 QT Interval:  362 QTC Calculation: 420 R Axis:   69 Text Interpretation: Normal sinus rhythm Nonspecific T wave abnormality No significant change since last tracing Confirmed by Cathren LaineSteinl, Maylon Sailors ([id number]) on 03/31/2021 3:48:28 PM  Radiology DG Chest Port 1 View  Result Date: 03/31/2021 CLINICAL DATA:  Cough EXAM: PORTABLE CHEST 1 VIEW COMPARISON:  08/29/2020 FINDINGS: Heart and mediastinal contours are within normal limits. No focal opacities or effusions. No acute bony abnormality. IMPRESSION: No active disease. Electronically Signed   By: Charlett NoseKevin  Dover M.D.   On: 03/31/2021 16:28    Procedures Procedures   Medications Ordered in ED Medications - No data to display  ED Course  I have reviewed the triage vital signs and the nursing notes.  Pertinent labs & imaging results that were available during my care of the patient were reviewed by me and considered in my medical decision making (see chart for details).    MDM  Rules/Calculators/A&P                          Iv ns. Continuous pulse ox and cardiac monitoring. Stat labs.   Reviewed nursing notes and prior charts for additional history.   Labs reviewed/interpreted by me - chem normal, hct 35.   CXR reviewed/interpreted by me - no pna.   Po fluids.  Patient is up and ambulating about room - no faintness or dizziness.   Recheck, vitals normal, no distress.  Pt currently appears stable for d/c.   Rec pcp f/u.  At d/c, pt now indicates wants to stay in hospital (no mention of other issues earlier and/or throughout ED stay until patient was informed she was being discharge - on review of prior charts, it appears similar symptoms/behaviors previously)- states intermittent feelings of anxiety and depression. On exam, pt has normal mood and affect. She does not appear acutely depressed. Patient does not appear to be responding to internal stimuli. No delusions or hallucinations noted. Denies wanting to harm self or others. States does have adequate of her meds.  I recommended to patient that for behavioral health issues/symptoms, may follow up directly to the (relatively) new [facility] center, and that they are open 24/7 and that can/do evaluate patients on a walk-in basis as well - will provide that information as well as resource guide for additional resources ([facility]).      Final Clinical Impression(s) / ED Diagnoses Final diagnoses:  None    Rx / DC Orders ED Discharge  Orders     None         Cathren Laine, MD 03/31/21 [phone]

## 2021-03-31 NOTE — ED Notes (Signed)
Pt ambulated to BR with steady gait.

## 2021-03-31 NOTE — ED Notes (Addendum)
[place], RN and this RN reviewed discharge instructions with patient in detail and provided information about follow up care regarding behavorial health and primary care provided per MD. Pt refusing to leave. Security called to escort patient out of ED. Pt refusing discharge vitals and wanting monitoring equipment to be removed.
